# Patient Record
Sex: Female | Born: 1985 | Race: Black or African American | Hispanic: No | Smoking: Never smoker
Health system: Southern US, Community
[De-identification: ages and names within clinical notes are randomized; demographics above are authoritative.]

## PROBLEM LIST (undated history)

## (undated) DIAGNOSIS — N3090 Cystitis, unspecified without hematuria: Secondary | ICD-10-CM

## (undated) DIAGNOSIS — N390 Urinary tract infection, site not specified: Secondary | ICD-10-CM

## (undated) DIAGNOSIS — G43909 Migraine, unspecified, not intractable, without status migrainosus: Secondary | ICD-10-CM

## (undated) DIAGNOSIS — N809 Endometriosis, unspecified: Secondary | ICD-10-CM

## (undated) DIAGNOSIS — F419 Anxiety disorder, unspecified: Secondary | ICD-10-CM

## (undated) DIAGNOSIS — D649 Anemia, unspecified: Secondary | ICD-10-CM

## (undated) DIAGNOSIS — K219 Gastro-esophageal reflux disease without esophagitis: Secondary | ICD-10-CM

## (undated) HISTORY — PX: ESOPHAGOGASTRODUODENOSCOPY ENDOSCOPY: SHX5814

## (undated) HISTORY — PX: COLONOSCOPY: SHX174

## (undated) HISTORY — PX: UMBILICAL HERNIA REPAIR: SHX196

## (undated) HISTORY — DX: Gastro-esophageal reflux disease without esophagitis: K21.9

## (undated) HISTORY — DX: Anemia, unspecified: D64.9

---
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
## 2003-11-07 ENCOUNTER — Other Ambulatory Visit: Admission: RE | Admit: 2003-11-07 | Discharge: 2003-11-07 | Payer: Self-pay | Admitting: Obstetrics and Gynecology

## 2003-11-27 ENCOUNTER — Emergency Department (HOSPITAL_COMMUNITY): Admission: EM | Admit: 2003-11-27 | Discharge: 2003-11-27 | Payer: Self-pay | Admitting: Emergency Medicine

## 2003-12-15 ENCOUNTER — Emergency Department (HOSPITAL_COMMUNITY): Admission: EM | Admit: 2003-12-15 | Discharge: 2003-12-15 | Payer: Self-pay | Admitting: Emergency Medicine

## 2004-02-06 ENCOUNTER — Ambulatory Visit (HOSPITAL_COMMUNITY): Admission: RE | Admit: 2004-02-06 | Discharge: 2004-02-06 | Payer: Self-pay | Admitting: General Surgery

## 2004-02-06 ENCOUNTER — Encounter (INDEPENDENT_AMBULATORY_CARE_PROVIDER_SITE_OTHER): Payer: Self-pay | Admitting: *Deleted

## 2004-02-06 ENCOUNTER — Ambulatory Visit (HOSPITAL_BASED_OUTPATIENT_CLINIC_OR_DEPARTMENT_OTHER): Admission: RE | Admit: 2004-02-06 | Discharge: 2004-02-06 | Payer: Self-pay | Admitting: General Surgery

## 2004-03-11 ENCOUNTER — Emergency Department (HOSPITAL_COMMUNITY): Admission: EM | Admit: 2004-03-11 | Discharge: 2004-03-11 | Payer: Self-pay | Admitting: Emergency Medicine

## 2004-06-10 ENCOUNTER — Emergency Department (HOSPITAL_COMMUNITY): Admission: EM | Admit: 2004-06-10 | Discharge: 2004-06-10 | Payer: Self-pay | Admitting: Emergency Medicine

## 2004-09-20 ENCOUNTER — Emergency Department (HOSPITAL_COMMUNITY): Admission: EM | Admit: 2004-09-20 | Discharge: 2004-09-20 | Payer: Self-pay | Admitting: Emergency Medicine

## 2004-09-28 ENCOUNTER — Emergency Department (HOSPITAL_COMMUNITY): Admission: EM | Admit: 2004-09-28 | Discharge: 2004-09-28 | Payer: Self-pay | Admitting: Emergency Medicine

## 2004-10-13 ENCOUNTER — Emergency Department (HOSPITAL_COMMUNITY): Admission: EM | Admit: 2004-10-13 | Discharge: 2004-10-13 | Payer: Self-pay | Admitting: Emergency Medicine

## 2004-12-17 ENCOUNTER — Other Ambulatory Visit: Admission: RE | Admit: 2004-12-17 | Discharge: 2004-12-17 | Payer: Self-pay | Admitting: Obstetrics and Gynecology

## 2005-04-28 ENCOUNTER — Emergency Department (HOSPITAL_COMMUNITY): Admission: EM | Admit: 2005-04-28 | Discharge: 2005-04-28 | Payer: Self-pay | Admitting: Emergency Medicine

## 2005-06-30 ENCOUNTER — Emergency Department (HOSPITAL_COMMUNITY): Admission: EM | Admit: 2005-06-30 | Discharge: 2005-06-30 | Payer: Self-pay | Admitting: Emergency Medicine

## 2005-08-18 ENCOUNTER — Emergency Department (HOSPITAL_COMMUNITY): Admission: EM | Admit: 2005-08-18 | Discharge: 2005-08-18 | Payer: Self-pay | Admitting: Emergency Medicine

## 2005-10-12 ENCOUNTER — Emergency Department (HOSPITAL_COMMUNITY): Admission: EM | Admit: 2005-10-12 | Discharge: 2005-10-12 | Payer: Self-pay | Admitting: Emergency Medicine

## 2005-10-27 ENCOUNTER — Emergency Department (HOSPITAL_COMMUNITY): Admission: EM | Admit: 2005-10-27 | Discharge: 2005-10-27 | Payer: Self-pay | Admitting: Emergency Medicine

## 2005-12-08 ENCOUNTER — Emergency Department (HOSPITAL_COMMUNITY): Admission: EM | Admit: 2005-12-08 | Discharge: 2005-12-08 | Payer: Self-pay | Admitting: Emergency Medicine

## 2006-03-01 ENCOUNTER — Emergency Department (HOSPITAL_COMMUNITY): Admission: EM | Admit: 2006-03-01 | Discharge: 2006-03-01 | Payer: Self-pay | Admitting: Emergency Medicine

## 2006-03-03 ENCOUNTER — Emergency Department (HOSPITAL_COMMUNITY): Admission: EM | Admit: 2006-03-03 | Discharge: 2006-03-03 | Payer: Self-pay

## 2006-04-30 ENCOUNTER — Emergency Department (HOSPITAL_COMMUNITY): Admission: EM | Admit: 2006-04-30 | Discharge: 2006-04-30 | Payer: Self-pay | Admitting: Emergency Medicine

## 2006-05-25 ENCOUNTER — Emergency Department (HOSPITAL_COMMUNITY): Admission: EM | Admit: 2006-05-25 | Discharge: 2006-05-25 | Payer: Self-pay | Admitting: Emergency Medicine

## 2006-07-17 ENCOUNTER — Emergency Department (HOSPITAL_COMMUNITY): Admission: EM | Admit: 2006-07-17 | Discharge: 2006-07-18 | Payer: Self-pay | Admitting: Emergency Medicine

## 2006-10-28 ENCOUNTER — Emergency Department (HOSPITAL_COMMUNITY): Admission: EM | Admit: 2006-10-28 | Discharge: 2006-10-28 | Payer: Self-pay | Admitting: Emergency Medicine

## 2007-01-16 ENCOUNTER — Emergency Department (HOSPITAL_COMMUNITY): Admission: EM | Admit: 2007-01-16 | Discharge: 2007-01-16 | Payer: Self-pay | Admitting: Emergency Medicine

## 2007-01-17 ENCOUNTER — Emergency Department (HOSPITAL_COMMUNITY): Admission: EM | Admit: 2007-01-17 | Discharge: 2007-01-17 | Payer: Self-pay | Admitting: Emergency Medicine

## 2007-02-27 ENCOUNTER — Emergency Department (HOSPITAL_COMMUNITY): Admission: EM | Admit: 2007-02-27 | Discharge: 2007-02-27 | Payer: Self-pay | Admitting: Emergency Medicine

## 2007-03-13 ENCOUNTER — Emergency Department (HOSPITAL_COMMUNITY): Admission: EM | Admit: 2007-03-13 | Discharge: 2007-03-13 | Payer: Self-pay | Admitting: Emergency Medicine

## 2007-04-10 ENCOUNTER — Emergency Department (HOSPITAL_COMMUNITY): Admission: EM | Admit: 2007-04-10 | Discharge: 2007-04-10 | Payer: Self-pay | Admitting: Emergency Medicine

## 2007-06-07 ENCOUNTER — Emergency Department (HOSPITAL_COMMUNITY): Admission: EM | Admit: 2007-06-07 | Discharge: 2007-06-07 | Payer: Self-pay | Admitting: Emergency Medicine

## 2007-06-12 ENCOUNTER — Emergency Department (HOSPITAL_COMMUNITY): Admission: EM | Admit: 2007-06-12 | Discharge: 2007-06-12 | Payer: Self-pay | Admitting: Emergency Medicine

## 2007-06-22 ENCOUNTER — Emergency Department (HOSPITAL_COMMUNITY): Admission: EM | Admit: 2007-06-22 | Discharge: 2007-06-22 | Payer: Self-pay | Admitting: Emergency Medicine

## 2007-07-30 ENCOUNTER — Inpatient Hospital Stay (HOSPITAL_COMMUNITY): Admission: AD | Admit: 2007-07-30 | Discharge: 2007-07-30 | Payer: Self-pay | Admitting: Obstetrics & Gynecology

## 2007-09-19 ENCOUNTER — Emergency Department (HOSPITAL_COMMUNITY): Admission: EM | Admit: 2007-09-19 | Discharge: 2007-09-19 | Payer: Self-pay | Admitting: Emergency Medicine

## 2007-10-17 ENCOUNTER — Emergency Department (HOSPITAL_COMMUNITY): Admission: EM | Admit: 2007-10-17 | Discharge: 2007-10-17 | Payer: Self-pay | Admitting: Emergency Medicine

## 2007-11-11 ENCOUNTER — Emergency Department (HOSPITAL_COMMUNITY): Admission: EM | Admit: 2007-11-11 | Discharge: 2007-11-11 | Payer: Self-pay | Admitting: Emergency Medicine

## 2007-12-08 ENCOUNTER — Emergency Department (HOSPITAL_COMMUNITY): Admission: EM | Admit: 2007-12-08 | Discharge: 2007-12-08 | Payer: Self-pay | Admitting: Emergency Medicine

## 2008-01-18 ENCOUNTER — Emergency Department (HOSPITAL_COMMUNITY): Admission: EM | Admit: 2008-01-18 | Discharge: 2008-01-18 | Payer: Self-pay | Admitting: Emergency Medicine

## 2008-01-23 ENCOUNTER — Emergency Department (HOSPITAL_COMMUNITY): Admission: EM | Admit: 2008-01-23 | Discharge: 2008-01-24 | Payer: Self-pay | Admitting: Emergency Medicine

## 2008-01-31 ENCOUNTER — Emergency Department (HOSPITAL_COMMUNITY): Admission: EM | Admit: 2008-01-31 | Discharge: 2008-02-01 | Payer: Self-pay | Admitting: Emergency Medicine

## 2008-03-25 ENCOUNTER — Emergency Department (HOSPITAL_COMMUNITY): Admission: EM | Admit: 2008-03-25 | Discharge: 2008-03-25 | Payer: Self-pay | Admitting: Emergency Medicine

## 2008-07-15 ENCOUNTER — Emergency Department (HOSPITAL_COMMUNITY): Admission: EM | Admit: 2008-07-15 | Discharge: 2008-07-15 | Payer: Self-pay | Admitting: Emergency Medicine

## 2008-08-10 ENCOUNTER — Emergency Department (HOSPITAL_COMMUNITY): Admission: EM | Admit: 2008-08-10 | Discharge: 2008-08-10 | Payer: Self-pay | Admitting: Emergency Medicine

## 2008-09-05 ENCOUNTER — Inpatient Hospital Stay (HOSPITAL_COMMUNITY): Admission: AD | Admit: 2008-09-05 | Discharge: 2008-09-05 | Payer: Self-pay | Admitting: Obstetrics & Gynecology

## 2008-10-17 ENCOUNTER — Ambulatory Visit: Payer: Self-pay | Admitting: Obstetrics and Gynecology

## 2008-10-21 ENCOUNTER — Ambulatory Visit (HOSPITAL_COMMUNITY): Admission: RE | Admit: 2008-10-21 | Discharge: 2008-10-21 | Payer: Self-pay | Admitting: Obstetrics & Gynecology

## 2008-10-31 ENCOUNTER — Encounter (INDEPENDENT_AMBULATORY_CARE_PROVIDER_SITE_OTHER): Payer: Self-pay | Admitting: Family Medicine

## 2008-10-31 ENCOUNTER — Ambulatory Visit: Payer: Self-pay | Admitting: Family Medicine

## 2008-11-28 ENCOUNTER — Ambulatory Visit: Payer: Self-pay | Admitting: Obstetrics and Gynecology

## 2009-02-13 ENCOUNTER — Ambulatory Visit: Payer: Self-pay | Admitting: Obstetrics and Gynecology

## 2009-02-26 ENCOUNTER — Emergency Department (HOSPITAL_COMMUNITY): Admission: EM | Admit: 2009-02-26 | Discharge: 2009-02-26 | Payer: Self-pay | Admitting: Emergency Medicine

## 2009-02-27 ENCOUNTER — Emergency Department (HOSPITAL_COMMUNITY): Admission: EM | Admit: 2009-02-27 | Discharge: 2009-02-27 | Payer: Self-pay | Admitting: Emergency Medicine

## 2009-03-12 ENCOUNTER — Ambulatory Visit: Payer: Self-pay | Admitting: Obstetrics and Gynecology

## 2009-03-13 ENCOUNTER — Encounter: Payer: Self-pay | Admitting: Obstetrics and Gynecology

## 2009-03-13 LAB — CONVERTED CEMR LAB
Clue Cells Wet Prep HPF POC: NONE SEEN
Trich, Wet Prep: NONE SEEN
WBC, Wet Prep HPF POC: NONE SEEN
Yeast Wet Prep HPF POC: NONE SEEN

## 2009-05-01 ENCOUNTER — Ambulatory Visit: Payer: Self-pay | Admitting: Obstetrics & Gynecology

## 2009-07-17 ENCOUNTER — Ambulatory Visit: Payer: Self-pay | Admitting: Obstetrics and Gynecology

## 2009-07-28 ENCOUNTER — Emergency Department (HOSPITAL_COMMUNITY): Admission: EM | Admit: 2009-07-28 | Discharge: 2009-07-28 | Payer: Self-pay | Admitting: Emergency Medicine

## 2009-08-13 ENCOUNTER — Ambulatory Visit: Payer: Self-pay | Admitting: Obstetrics and Gynecology

## 2010-02-02 ENCOUNTER — Ambulatory Visit: Payer: Self-pay | Admitting: Obstetrics and Gynecology

## 2010-04-09 ENCOUNTER — Emergency Department (HOSPITAL_COMMUNITY): Admission: EM | Admit: 2010-04-09 | Discharge: 2010-04-09 | Payer: Self-pay | Admitting: Emergency Medicine

## 2010-04-12 ENCOUNTER — Emergency Department (HOSPITAL_COMMUNITY): Admission: EM | Admit: 2010-04-12 | Discharge: 2010-04-12 | Payer: Self-pay | Admitting: Emergency Medicine

## 2010-04-27 IMAGING — CT CT PELVIS W/O CM
1 series · 16 of 32 positions shown, 20 images · non-contrast
Comparison: CT the abdomen pelvis earlier [DATE].

CLINICAL DATA: Abdominal pain rule out appendicitis.

CT PELVIS WITHOUT CONTRAST
TECHNIQUE: Multidetector CT imaging of the pelvis was performed
following the standard protocol without intravenous contrast.

[Series 2: stone_wo 5.0 b40f st · axial · 0.64mm/px · z∈[+50,+310]mm · 16 of 73 slices shown, 20 images]
[im 5/73  soft-tissue]
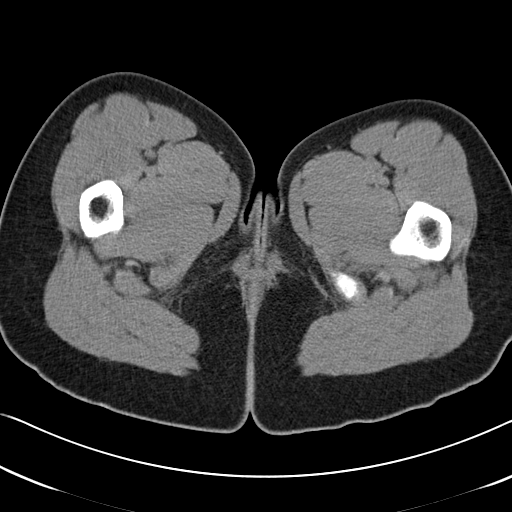
[im 5/73  bone]
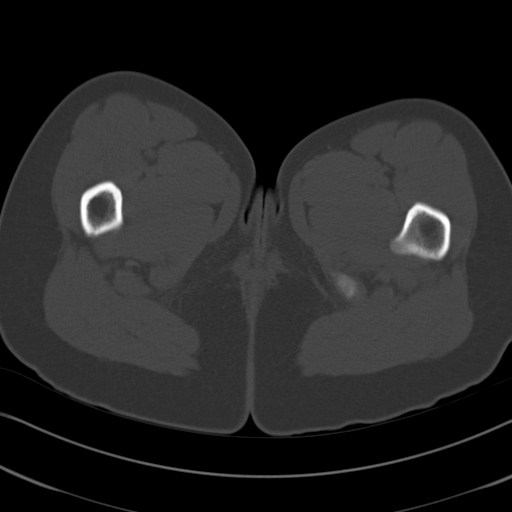
[im 10/73  soft-tissue]
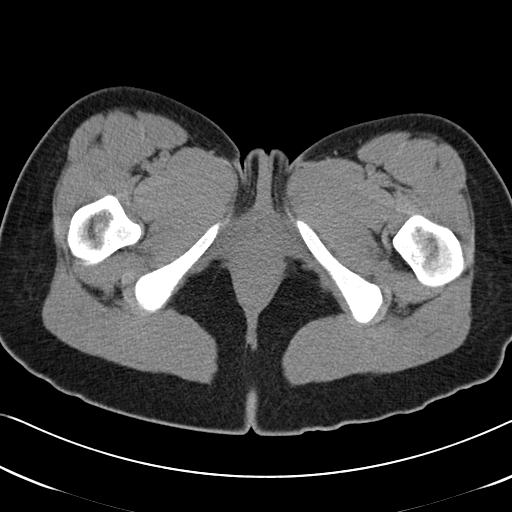
[im 14/73  soft-tissue]
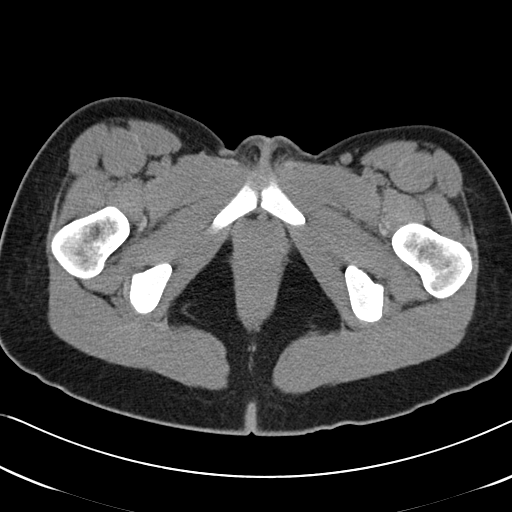
[im 19/73  soft-tissue]
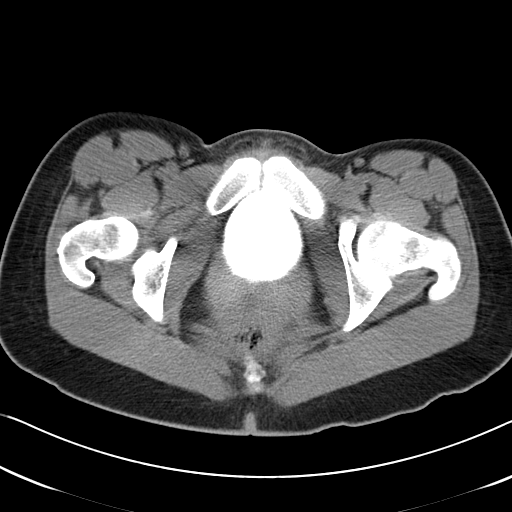
[im 24/73  soft-tissue]
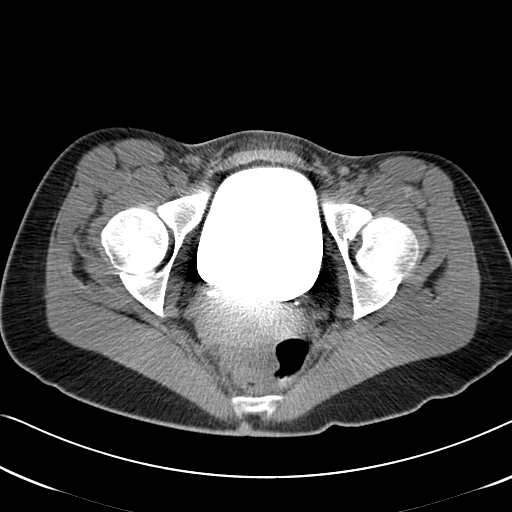
[im 28/73  soft-tissue]
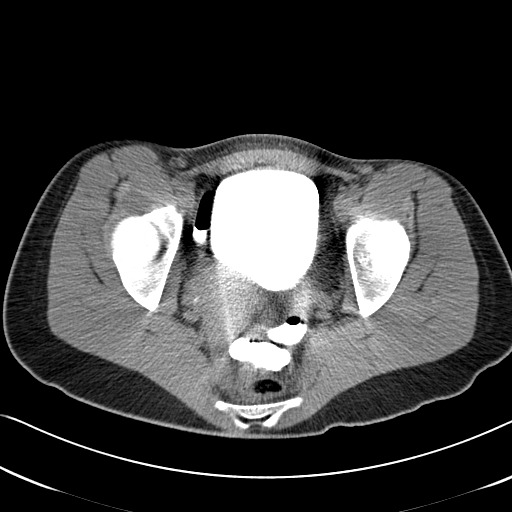
[im 33/73  soft-tissue]
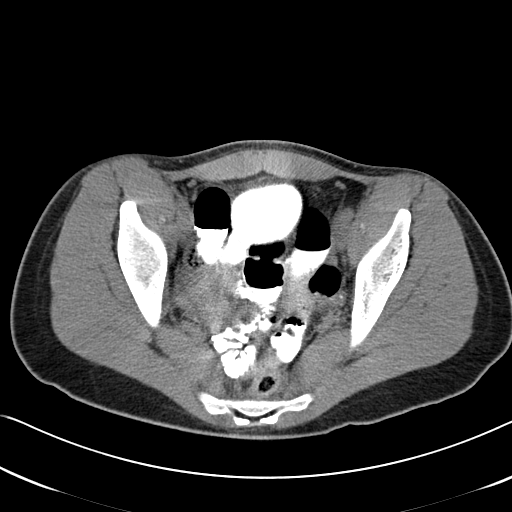
[im 40/73  soft-tissue]
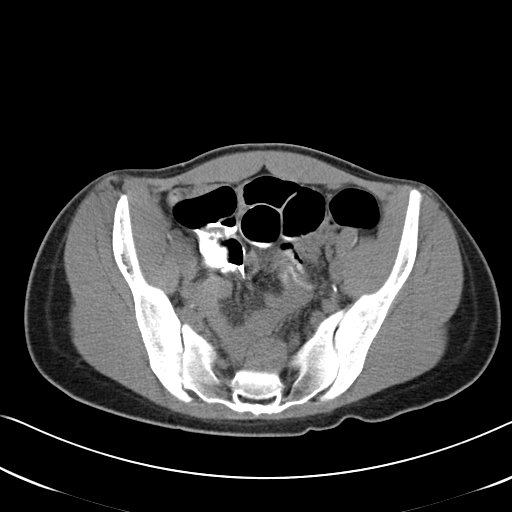
[im 45/73  soft-tissue]
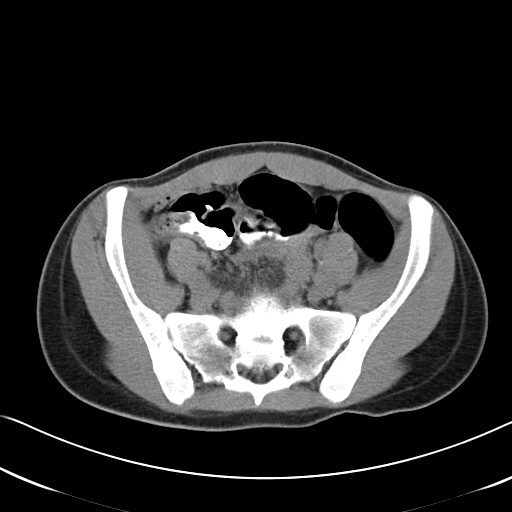
[im 45/73  bone]
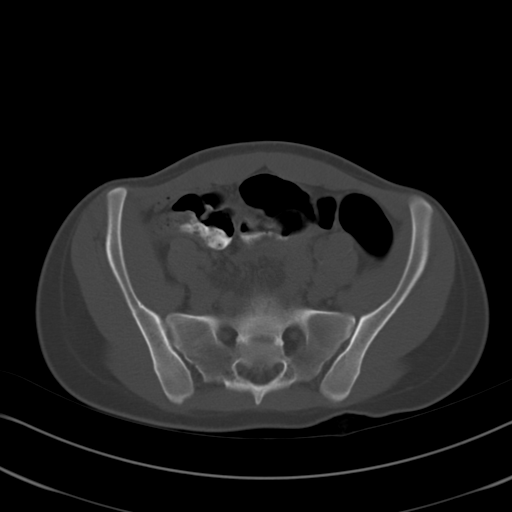
[im 49/73  soft-tissue]
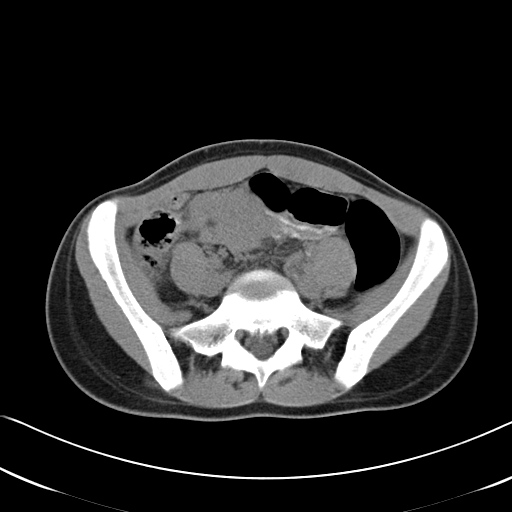
[im 54/73  soft-tissue]
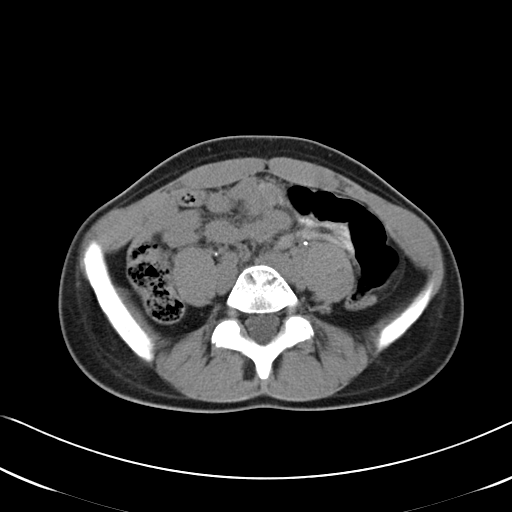
[im 59/73  soft-tissue]
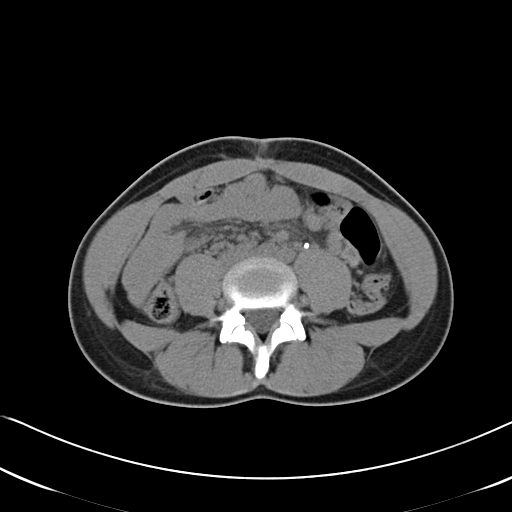
[im 63/73  soft-tissue]
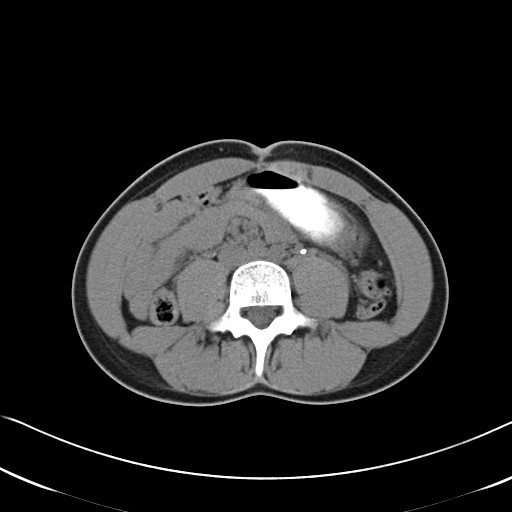
[im 63/73  lung]
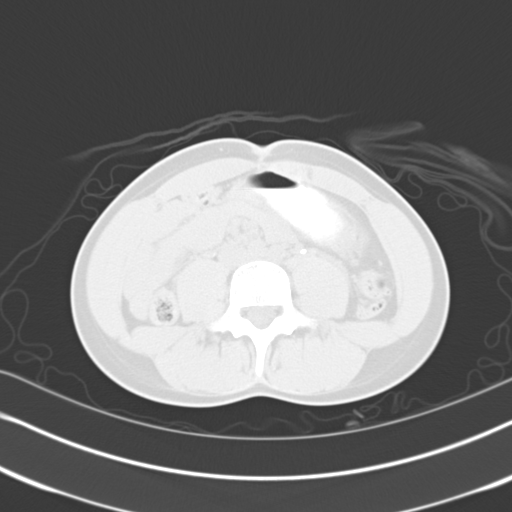
[im 66/73  lung]
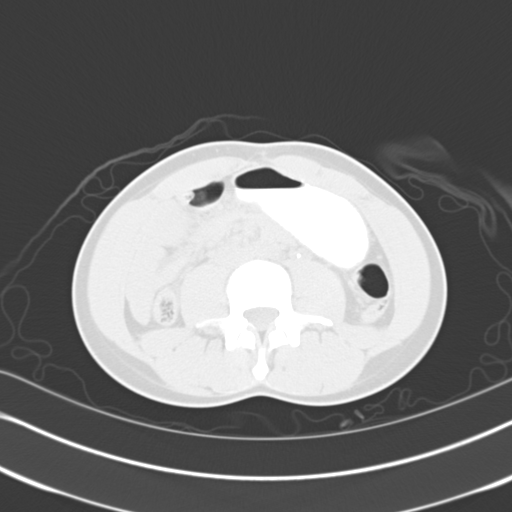
[im 68/73  soft-tissue]
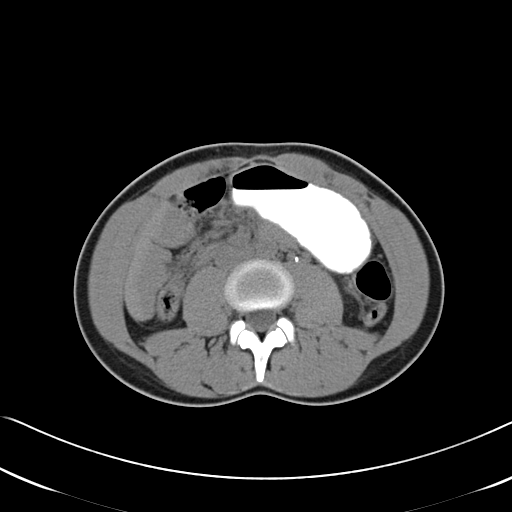
[im 68/73  lung]
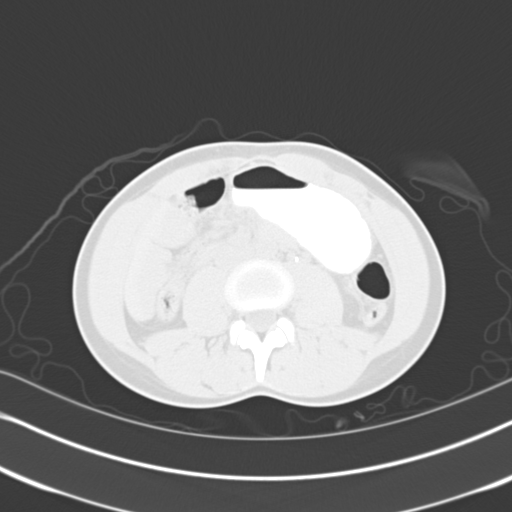
[im 70/73  lung]
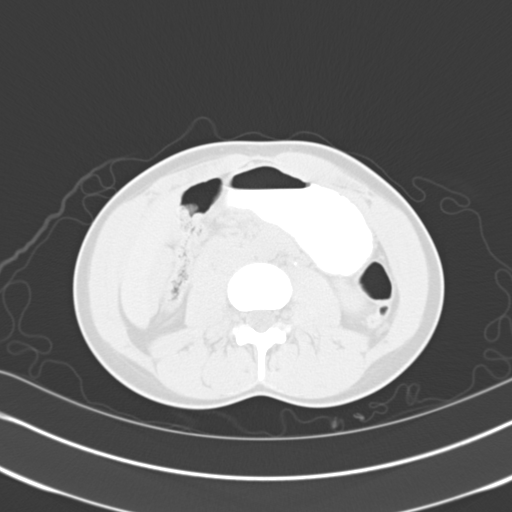

[16 of 32 positions shown; findings below may reference images not displayed]

FINDINGS: Delayed images through the pelvis show opacification of
the distal small bowel loops and the proximal aspect of the
ascending colon.  There is significant artifact from contrast-
opacified bladder.  The bowel loop in question within the right
lower quadrant is no longer suspicious.  The right lower quadrant
bowel loops do fill normally with contrast.  The appendix is not
definitely identified
IMPRESSION: No definite evidence for acute appendicitis.  However the appendix
is not well seen.  I discussed findings with Dr. Adili Ritaj.

## 2010-07-22 ENCOUNTER — Emergency Department (HOSPITAL_COMMUNITY)
Admission: EM | Admit: 2010-07-22 | Discharge: 2010-07-22 | Payer: Self-pay | Source: Home / Self Care | Admitting: Emergency Medicine

## 2010-08-02 ENCOUNTER — Encounter: Payer: Self-pay | Admitting: *Deleted

## 2010-09-01 ENCOUNTER — Emergency Department (HOSPITAL_COMMUNITY)
Admission: EM | Admit: 2010-09-01 | Discharge: 2010-09-01 | Disposition: A | Discharge status: Home / Self Care | Payer: Self-pay | Attending: Emergency Medicine | Admitting: Emergency Medicine

## 2010-09-01 DIAGNOSIS — B86 Scabies: Secondary | ICD-10-CM | POA: Insufficient documentation

## 2010-09-01 DIAGNOSIS — J3489 Other specified disorders of nose and nasal sinuses: Secondary | ICD-10-CM | POA: Insufficient documentation

## 2010-09-01 DIAGNOSIS — H9209 Otalgia, unspecified ear: Secondary | ICD-10-CM | POA: Insufficient documentation

## 2010-09-01 DIAGNOSIS — J069 Acute upper respiratory infection, unspecified: Secondary | ICD-10-CM | POA: Insufficient documentation

## 2010-09-01 DIAGNOSIS — L299 Pruritus, unspecified: Secondary | ICD-10-CM | POA: Insufficient documentation

## 2010-09-27 LAB — POCT PREGNANCY, URINE

## 2010-09-27 LAB — URINALYSIS, ROUTINE W REFLEX MICROSCOPIC
Ketones, ur: NEGATIVE mg/dL
Nitrite: NEGATIVE
Protein, ur: 100 mg/dL — AB
Urobilinogen, UA: 1 mg/dL (ref 0.0–1.0)

## 2010-10-17 LAB — COMPREHENSIVE METABOLIC PANEL
AST: 24 U/L (ref 0–37)
BUN: 6 mg/dL (ref 6–23)
CO2: 27 mEq/L (ref 19–32)
Chloride: 109 mEq/L (ref 96–112)
Creatinine, Ser: 0.87 mg/dL (ref 0.4–1.2)
GFR calc non Af Amer: >60 mL/min (ref >60)
Glucose, Bld: 82 mg/dL (ref 70–99)
Total Bilirubin: 1 mg/dL (ref 0.3–1.2)

## 2010-10-17 LAB — POCT CARDIAC MARKERS: CKMB, poc: <1 ng/mL — ABNORMAL LOW (ref 1.0–8.0)

## 2010-10-17 LAB — POCT I-STAT, CHEM 8
Creatinine, Ser: 0.9 mg/dL (ref 0.4–1.2)
Hemoglobin: 14.6 g/dL (ref 12.0–15.0)
Sodium: 138 mEq/L (ref 135–145)
TCO2: 22 mmol/L (ref 0–100)

## 2010-10-26 LAB — URINALYSIS, ROUTINE W REFLEX MICROSCOPIC
Bilirubin Urine: NEGATIVE
Glucose, UA: NEGATIVE mg/dL
Ketones, ur: NEGATIVE mg/dL
Ketones, ur: NEGATIVE mg/dL
Leukocytes, UA: NEGATIVE
Nitrite: NEGATIVE
Protein, ur: 30 mg/dL — AB
Protein, ur: NEGATIVE mg/dL
pH: 7 (ref 5.0–8.0)

## 2010-10-26 LAB — CBC
HCT: 35.2 % — ABNORMAL LOW (ref 36.0–46.0)
Hemoglobin: 11.8 g/dL — ABNORMAL LOW (ref 12.0–15.0)
MCHC: 33.4 g/dL (ref 30.0–36.0)
MCV: 90.4 fL (ref 78.0–100.0)
Platelets: 164 K/uL (ref 150–400)
RBC: 3.9 MIL/uL (ref 3.87–5.11)
RDW: 13.8 % (ref 11.5–15.5)
WBC: 7.4 10*3/uL (ref 4.0–10.5)

## 2010-10-26 LAB — DIFFERENTIAL
Basophils Absolute: 0 K/uL (ref 0.0–0.1)
Basophils Relative: 0 % (ref 0–1)
Eosinophils Absolute: 0.1 10*3/uL (ref 0.0–0.7)
Eosinophils Relative: 1 % (ref 0–5)
Lymphocytes Relative: 26 % (ref 12–46)
Lymphs Abs: 1.9 K/uL (ref 0.7–4.0)
Monocytes Absolute: 0.4 K/uL (ref 0.1–1.0)
Monocytes Relative: 5 % (ref 3–12)
Neutro Abs: 5 K/uL (ref 1.7–7.7)
Neutrophils Relative %: 68 % (ref 43–77)

## 2010-10-26 LAB — COMPREHENSIVE METABOLIC PANEL WITH GFR
AST: 18 U/L (ref 0–37)
Albumin: 3.3 g/dL — ABNORMAL LOW (ref 3.5–5.2)
BUN: 8 mg/dL (ref 6–23)
Calcium: 9.1 mg/dL (ref 8.4–10.5)
Creatinine, Ser: 0.68 mg/dL (ref 0.4–1.2)
GFR calc Af Amer: >60 mL/min (ref >60)
GFR calc non Af Amer: >60 mL/min (ref >60)

## 2010-10-26 LAB — COMPREHENSIVE METABOLIC PANEL
ALT: 11 U/L (ref 0–35)
Alkaline Phosphatase: 57 U/L (ref 39–117)
CO2: 24 mEq/L (ref 19–32)
Chloride: 109 mEq/L (ref 96–112)
Glucose, Bld: 118 mg/dL — ABNORMAL HIGH (ref 70–99)
Potassium: 3.5 mEq/L (ref 3.5–5.1)
Sodium: 140 mEq/L (ref 135–145)
Total Bilirubin: 0.5 mg/dL (ref 0.3–1.2)
Total Protein: 6 g/dL (ref 6.0–8.3)

## 2010-10-26 LAB — LIPASE, BLOOD: Lipase: 41 U/L (ref 11–59)

## 2010-10-26 LAB — POCT I-STAT, CHEM 8
BUN: 11 mg/dL (ref 6–23)
Calcium, Ion: 0.88 mmol/L — ABNORMAL LOW (ref 1.12–1.32)
Creatinine, Ser: 1 mg/dL (ref 0.4–1.2)
TCO2: 17 mmol/L (ref 0–100)

## 2010-10-26 LAB — WET PREP, GENITAL
Clue Cells Wet Prep HPF POC: NONE SEEN
Trich, Wet Prep: NONE SEEN
Trich, Wet Prep: NONE SEEN
WBC, Wet Prep HPF POC: NONE SEEN
Yeast Wet Prep HPF POC: NONE SEEN
Yeast Wet Prep HPF POC: NONE SEEN

## 2010-10-26 LAB — GC/CHLAMYDIA PROBE AMP, GENITAL
Chlamydia, DNA Probe: NEGATIVE
GC Probe Amp, Genital: NEGATIVE

## 2010-10-27 LAB — URINALYSIS, ROUTINE W REFLEX MICROSCOPIC
Glucose, UA: NEGATIVE mg/dL
Hgb urine dipstick: NEGATIVE
Protein, ur: NEGATIVE mg/dL
Specific Gravity, Urine: 1.02 (ref 1.005–1.030)

## 2010-10-27 LAB — RAPID URINE DRUG SCREEN, HOSP PERFORMED
Amphetamines: NOT DETECTED
Barbiturates: NOT DETECTED
Benzodiazepines: NOT DETECTED
Opiates: NOT DETECTED

## 2010-10-27 LAB — CBC
MCHC: 32.8 g/dL (ref 30.0–36.0)
RDW: 13.8 % (ref 11.5–15.5)

## 2010-11-24 NOTE — Group Therapy Note (Signed)
NAMERAEGHAN, DEMETER NO.:  [id number]   MEDICAL RECORD NO.:  [id number]          PATIENT TYPE:  WOC   LOCATION:  [facility]                   FACILITY:  [facility]   PHYSICIAN:  Dorthula Perfect, MD     DATE OF BIRTH:  1985/11/04   DATE OF SERVICE:                                  CLINIC NOTE   A 25 year old black female, gravida 0, returns for Depo-Provera.  She  also has a complaint of a 2-week history of a white somewhat itchy  vaginal discharge that does not having any appreciable odor.  Her sexual  partner is circumcised.   PLAN:  She is receiving the Depo-Provera because of long history of  chronic pelvic pain.  She had had any endometriosis diagnosed on a  biopsy, which was performed during an umbilical hernia repair.  Her last  Depo-Provera was approximately 3 months ago.  She is having no vaginal  bleeding.  She has no pelvic pain.  Her last period was on October 04, 2008.  Her last Pap smear was on November 04, 2008.  Her last ultrasound  was in April of this year and was normal.   PHYSICAL EXAMINATION:  VITAL SIGNS:  Height 5 feet 6, weight 114, and  blood pressure 101/68.  ABDOMEN:  Flat, soft, and nontender.  PELVIC:  External genitalia, BUS, Skene glands were normal.  There were  no lesions on the vulva.  There was a watery, bubbly white vaginal  discharge present.  The cervix was normal.  Uterus is of normal size and  shape.  The adnexa structures were normal.   Wet prep was done, which revealed findings compatible with bacterial  vaginosis.   DIAGNOSES:  1. Bacterial vaginosis.  2. Pelvic endometriosis.   DISPOSITION:  1. Wet prep.  2. Metronidazole 500 mg tablets 14 to take 1 twice a day.  3. Depo-Provera 150 mg.           ______________________________  Dorthula Perfect, MD     ER/MEDQ  D:  02/13/2009  T:  02/14/2009  Job:  [id number]

## 2010-11-24 NOTE — Group Therapy Note (Signed)
[name], [name] NO.:  [id number]   MEDICAL RECORD NO.:  [id number]          PATIENT TYPE:  WOC   LOCATION:  [facility]                   FACILITY:  [facility]   PHYSICIAN:  Argentina Donovan, MD        DATE OF BIRTH:  1985/09/26   DATE OF SERVICE:                                  CLINIC NOTE   SUBJECTIVE:  The patient is a 25 year old female with a history of  endometriosis diagnosed 5 years ago following umbilical hernia repair  when they found endometrial tissue commented on the  pathology, who  presents following visit to [facility] __________ admission seen  on September 05, 2008.  At that time the patient was complaining of  abdominal pain.  The patient reports that she has required multiple  hospitalizations for abdominal pain in the past.  The patient was given  injection of Toradol and Depo-Provera and subsequently has had  improvement in her pain.  The patient has continued to have some  abnormal bleeding, however her pain is now controlled with over-the-  counter ibuprofen.  The patient is currently on her menstrual cycle and  has been for approximately 2 weeks.  Of note, the patient was evaluated  in January 2010 and found to have an ovarian cyst on her right ovary  that was complex in nature and had not had followup for it.   OBJECTIVE:  Temperature 98.5, heart rate 72, blood pressure 108/72,  weight 112.9 pounds, height 5 feet 6.  The patient is a thin female in no apparent distress.  GU EXAM:  Deferred secondary to menstrual bleeding.  ABDOMEN:  The patient's abdomen is thin, soft, nontender, nondistended  __________ ,  mild tenderness to palpation suprapubically.   ASSESSMENT AND PLAN:  1. Endometriosis:  Pain really improved with Depo-Provera.  However,      the patient now with breakthrough bleeding.  Will do trial of      estradiol 1 mg p.o. daily x10 days to help with menstrual bleeding      and have patient follow up in 2 weeks.  The patient  will be due for      next Depo-Provera injection around Dec 03, 2008.  2. Complex right ovarian cyst:  The patient has been told in the past      that she had ovarian cyst.  On ultrasound was noted to have a 4.2-      cm complex cyst on the right ovary.  Will get follow-up ultrasound      to evaluate.     ______________________________  Lequita Asal, M.D.    ______________________________  Argentina Donovan, MD    TB/MEDQ  D:  10/17/2008  T:  10/17/2008  Job:  [id number]

## 2010-11-24 NOTE — Group Therapy Note (Signed)
NAMEJAMIAYA, Jane [name] NO.:  [id number]   MEDICAL RECORD NO.:  [id number]          PATIENT TYPE:  WOC   LOCATION:  [facility]                   FACILITY:  [facility]   PHYSICIAN:  Scheryl Darter, MD       DATE OF BIRTH:  03-May-1986   DATE OF SERVICE:  10/31/2008                                  CLINIC NOTE   REASON FOR VISIT:  Follow-up on ultrasound and Pap smear.   HISTORY OF PRESENT ILLNESS:  [name] is a 25 year old nulliparous  female with a long history of chronic pelvic pain, recently diagnosed  with endometriosis from a biopsy which was performed during an umbilical  hernia repair.  She received Depo-Provera approximately 2 months ago  which has provided her with exceptional relief of her chronic pain.  She  relates some bleeding initially from the Depo shot. However, after a 10-  day course of estradiol, the bleeding has completely resolved.  She  relates previously having almost daily moderate to severe pain to her  present case where she essentially has no pain except for occasional  brief periods.  Also of note during this visit, she noted that she has  not had a Pap smear in several years.   PAST MEDICAL HISTORY:  Significant for chronic pelvic pain and  endometriosis.   PAST SURGICAL HISTORY:  Related to an umbilical hernia repair.  She has  never been pregnant. She relates a previous abnormal Pap smear which was  treated with only a repeat Pap.  She has no history of colposcopy.  She  denies any history of sexually transmitted diseases.  She is using Depo-  Provera for birth control.   FAMILY HISTORY:  Significant for grandmother who developed breast cancer  at the age of 31.   SOCIAL HISTORY:  She denies tobacco use or alcohol use.   PHYSICAL EXAM:  VITAL SIGNS:  On exam her vital signs stable and normal.  GENERAL: She is healthy-appearing in no acute distress.  BREASTS:  Exam was performed and revealed no discrete masses.  Fibrocystic breast  tissue is normal bilaterally.  There is no nipple  discharge.  There is no axillary adenopathy bilaterally.  ABDOMEN:  Soft and nontender.  There is no organomegaly noted.  GENITOURINARY:  Her external female genitalia is normal-appearing.  Vaginal mucosa is normal with normal rugae.  The cervix is nulliparous-  appearing without lesion.  There is no vaginal discharge present.  Her  uterus is of normal size and shape. The adnexa reveal no fullness or  tenderness.   ASSESSMENT/PLAN:  1. Female well-woman pelvic exam.  Normal exam today.  Pap smear was      performed, and the patient will get the results by mail in      approximately 2-3 weeks.  2. Hemorrhagic ovarian cyst.  The patient had previously had an      ovarian cyst noted on ultrasound. Ultrasound was repeated after a 6-      week interval approximately 2 weeks ago which revealed complete      resolution of the cyst.  3. Endometriosis.  The patient has had  a very remarkable improvement      with her Depo-Provera, and she will continue to receive a shot at      the appropriate intervals.     ______________________________  Odie Sera, D.O.    ______________________________  Scheryl Darter, MD    MC/MEDQ  D:  10/31/2008  T:  10/31/2008  Job:  [id number]

## 2010-11-24 NOTE — Group Therapy Note (Signed)
NAME:  Jane [name], Jane [name] NO.:  [id number]   MEDICAL RECORD NO.:  [id number]          PATIENT TYPE:  WOC   LOCATION:  [facility]                   FACILITY:  [facility]   PHYSICIAN:  Argentina Donovan, MD        DATE OF BIRTH:  1986/03/19   DATE OF SERVICE:                                  CLINIC NOTE   The patient is a 25 year old nulligravida African American female who is  on Depo-Provera with much success because of the history of severe  chronic pelvic pain, which was diagnosed as probable endometriosis based  on a biopsy from a nodule removed from her umbilicus, which confirmed  endometriosis.  At the present time she has mild cramping with her  periods, but nothing out.  She was in a short-time ago and was treated  with metronidazole for vaginal irritation and she still says she has  some itching.  She came back in today.  We did wet prep on her and also  a viral culture on some paper cut looking lesions that were just on the  perineum just to the right of the left labia minora.  I told her that if  it showed that was positive for herpes, we would put her on suppression  and if it showed nothing or yeast, I would treat her with an  antimonilial cream.  The other possibility is that this is a  neurodermatitis from the itching and that these are scratch marks in any  case that is the area of her concern.   IMPRESSION:  Perineal vulvar itching secondary to vulvitis.  Pending lab  tests.  The patient will be called with results.           ______________________________  Argentina Donovan, MD     PR/MEDQ  D:  03/12/2009  T:  03/13/2009  Job:  [id number]

## 2010-11-27 NOTE — Op Note (Signed)
NAME:  Jane [name], Jane [name]                      ACCOUNT NO.:  [id number]   MEDICAL RECORD NO.:  [id number]                   PATIENT TYPE:  AMB   LOCATION:  [facility]                                  FACILITY:  [facility]   PHYSICIAN:  Angelia Mould. Derrell Lolling, M.D.             DATE OF BIRTH:  11-May-1986   DATE OF PROCEDURE:  02/06/2004  DATE OF DISCHARGE:                                 OPERATIVE REPORT   PREOPERATIVE DIAGNOSIS:  Incarcerated umbilical hernia.   POSTOPERATIVE DIAGNOSES:  1. Umbilical hernia.  2. Soft tissue mass of umbilicus (1.0 cm diameter).   OPERATION PERFORMED:  1. Excise soft tissue mass of the umbilicus (1.0 cm diameter).  2. Repair umbilical hernia.   SURGEON:  Angelia Mould. Derrell Lolling, M.D.   OPERATIVE INDICATION:  This is a 25 year old black female who presented with  periumbilical pain.  She was found to have a tender nodule presenting at the  superior aspect of the umbilicus.  This palpation reproduced her pain.  The  nodule was not reducible.  It was postulated that this was an incarcerated  umbilical hernia, and soft tissue mass could not be ruled out.  She is  brought to the operating room electively.   OPERATIVE FINDINGS:  The patient had a small umbilical hernia.  However,  within the fascia at the superior rim of the umbilical hernia, there was a  1.0 cm nodule which was mostly solid, looked a little bit hemorrhagic,  consistent with an old hematoma or possibly endometriosis.  This was  completely excised and sent for histologic exam.   OPERATIVE TECHNIQUE:  Following the induction of general endotracheal  anesthesia, the patient's abdomen was prepped and draped in a sterile  fashion.  Marcaine 0.5% with epinephrine was used as a local infiltration  anesthetic.  A curved transverse incision was made at the lower rim of the  umbilicus.  Dissection was carried down through the subcutaneous tissue to  the anterior abdominal wall fascia.  Dissection was carried around  the  umbilical skin and the umbilical skin dissected off of the fascia, revealing  about a 1.0 cm umbilical hernia.  There really were no incarcerated contents  in this.  Palpation superiorly revealed a mass within the fascia.  I  undermined the subcutaneous tissue away from the fascia circumferentially  and then using a knife completely excised the nodule in the fascia at the  superior rim of the umbilical hernia.  I cut this open on the side table,  and it was mostly solid and a little bit hemorrhagic.  This was sent for  histology.   I palpated within the abdomen the abdominal wall and felt no other defects.  The fascia was closed vertically with five interrupted sutures of #1  Novofil.  I was careful to invert the knots because the patient is thin.  After all of these sutures were placed, I then tied all of them and the  repair looked quite secure.  The wound was irrigated with saline.  The  umbilical skin was tacked down to the fascia with a 3-0 Vicryl suture.  Subcutaneous tissue was closed with  interrupted sutures of 3-0 Vicryl.  The skin was closed with a running  subcuticular suture of 4-0 Monocryl and Steri-Strips.  Clean bandages were  placed and the patient taken to the recovery room in stable condition.  Estimated blood loss was about 10 mL.  Complications:  None.  Sponge,  needle, and instrument counts were correct.                                               Angelia Mould. Derrell Lolling, M.D.    HMI/MEDQ  D:  02/06/2004  T:  02/06/2004  Job:  [id number]   cc:   Lorelle Formosa, M.D.  [phone] [address]  [place]  [place] [address]  Fax: [phone]

## 2010-12-02 ENCOUNTER — Emergency Department (HOSPITAL_COMMUNITY)
Admission: EM | Admit: 2010-12-02 | Discharge: 2010-12-02 | Disposition: A | Discharge status: Home / Self Care | Payer: Self-pay | Attending: Emergency Medicine | Admitting: Emergency Medicine

## 2010-12-02 DIAGNOSIS — L03119 Cellulitis of unspecified part of limb: Secondary | ICD-10-CM | POA: Insufficient documentation

## 2010-12-02 DIAGNOSIS — L02419 Cutaneous abscess of limb, unspecified: Secondary | ICD-10-CM | POA: Insufficient documentation

## 2010-12-16 ENCOUNTER — Ambulatory Visit: Payer: Self-pay | Admitting: Advanced Practice Midwife

## 2011-01-09 DIAGNOSIS — F1410 Cocaine abuse, uncomplicated: Secondary | ICD-10-CM | POA: Insufficient documentation

## 2011-01-09 DIAGNOSIS — N83209 Unspecified ovarian cyst, unspecified side: Secondary | ICD-10-CM | POA: Insufficient documentation

## 2011-01-09 DIAGNOSIS — N803 Endometriosis of pelvic peritoneum: Secondary | ICD-10-CM

## 2011-01-21 ENCOUNTER — Emergency Department (HOSPITAL_COMMUNITY)
Admission: EM | Admit: 2011-01-21 | Discharge: 2011-01-21 | Disposition: A | Discharge status: Home / Self Care | Payer: Self-pay | Attending: Emergency Medicine | Admitting: Emergency Medicine

## 2011-01-21 DIAGNOSIS — R51 Headache: Secondary | ICD-10-CM | POA: Insufficient documentation

## 2011-01-21 DIAGNOSIS — Z8742 Personal history of other diseases of the female genital tract: Secondary | ICD-10-CM | POA: Insufficient documentation

## 2011-01-21 DIAGNOSIS — R110 Nausea: Secondary | ICD-10-CM | POA: Insufficient documentation

## 2011-01-22 ENCOUNTER — Ambulatory Visit: Payer: Self-pay | Admitting: Advanced Practice Midwife

## 2011-04-02 LAB — URINALYSIS, ROUTINE W REFLEX MICROSCOPIC
Ketones, ur: 15 — AB
Nitrite: NEGATIVE
Protein, ur: NEGATIVE

## 2011-04-02 LAB — CBC
Hemoglobin: 11.9 — ABNORMAL LOW
MCHC: 33.9
RBC: 3.89

## 2011-04-02 LAB — WET PREP, GENITAL

## 2011-04-02 LAB — BASIC METABOLIC PANEL
CO2: 23
Calcium: 9
Chloride: 105
GFR calc Af Amer: >60
Sodium: 138

## 2011-04-02 LAB — PREGNANCY, URINE: Preg Test, Ur: NEGATIVE

## 2011-04-05 LAB — URINALYSIS, ROUTINE W REFLEX MICROSCOPIC
Glucose, UA: NEGATIVE
Protein, ur: NEGATIVE
pH: 7

## 2011-04-05 LAB — URINE MICROSCOPIC-ADD ON

## 2011-04-06 LAB — CBC
Platelets: 185
RDW: 13.1
WBC: 7.7

## 2011-04-06 LAB — URINALYSIS, ROUTINE W REFLEX MICROSCOPIC
Bilirubin Urine: NEGATIVE
Glucose, UA: NEGATIVE
Ketones, ur: NEGATIVE
Protein, ur: 100 — AB

## 2011-04-06 LAB — DIFFERENTIAL
Basophils Absolute: 0
Lymphocytes Relative: 21
Lymphs Abs: 1.6
Neutro Abs: 5.8

## 2011-04-06 LAB — BASIC METABOLIC PANEL
BUN: 9
Calcium: 8.8
GFR calc non Af Amer: >60
Glucose, Bld: 114 — ABNORMAL HIGH
Potassium: 4
Sodium: 136

## 2011-04-06 LAB — URINE MICROSCOPIC-ADD ON

## 2011-04-06 LAB — PREGNANCY, URINE: Preg Test, Ur: NEGATIVE

## 2011-04-07 LAB — URINALYSIS, ROUTINE W REFLEX MICROSCOPIC
Hgb urine dipstick: NEGATIVE
Nitrite: NEGATIVE
Protein, ur: NEGATIVE
Specific Gravity, Urine: 1.025
Urobilinogen, UA: 1

## 2011-04-07 LAB — CBC
MCV: 89.9
Platelets: 179
WBC: 5.8

## 2011-04-07 LAB — DIFFERENTIAL
Eosinophils Absolute: 0
Lymphs Abs: 1
Neutro Abs: 4.5
Neutrophils Relative %: 77

## 2011-04-07 LAB — PREGNANCY, URINE: Preg Test, Ur: NEGATIVE

## 2011-04-09 LAB — POCT PREGNANCY, URINE
Operator id: 29011
Preg Test, Ur: NEGATIVE

## 2011-04-14 LAB — COMPREHENSIVE METABOLIC PANEL
AST: 17
Albumin: 3.9
Calcium: 9
Creatinine, Ser: 0.56
GFR calc Af Amer: >60

## 2011-04-14 LAB — URINALYSIS, ROUTINE W REFLEX MICROSCOPIC
Bilirubin Urine: NEGATIVE
Ketones, ur: NEGATIVE
Nitrite: NEGATIVE
Specific Gravity, Urine: 1.031 — ABNORMAL HIGH
Urobilinogen, UA: 0.2

## 2011-04-14 LAB — CBC
MCHC: 34.4
MCV: 86.1
Platelets: 254
WBC: 6.8

## 2011-04-14 LAB — DIFFERENTIAL
Eosinophils Relative: 3
Lymphocytes Relative: 35
Lymphs Abs: 2.4
Monocytes Absolute: 0.5
Monocytes Relative: 8

## 2011-04-19 LAB — URINE MICROSCOPIC-ADD ON

## 2011-04-19 LAB — URINALYSIS, ROUTINE W REFLEX MICROSCOPIC
Bilirubin Urine: NEGATIVE
Nitrite: NEGATIVE
Specific Gravity, Urine: 1.022
pH: 7.5

## 2011-04-19 LAB — URINE CULTURE: Colony Count: >=100000

## 2011-04-20 LAB — URINALYSIS, ROUTINE W REFLEX MICROSCOPIC
Bilirubin Urine: NEGATIVE
Glucose, UA: NEGATIVE
Ketones, ur: NEGATIVE
Leukocytes, UA: NEGATIVE
Nitrite: NEGATIVE
Protein, ur: 30 — AB
Specific Gravity, Urine: 1.022
Urobilinogen, UA: 0.2
pH: 8

## 2011-04-20 LAB — URINE MICROSCOPIC-ADD ON

## 2011-04-20 LAB — PREGNANCY, URINE: Preg Test, Ur: NEGATIVE

## 2011-04-22 LAB — URINALYSIS, ROUTINE W REFLEX MICROSCOPIC
Bilirubin Urine: NEGATIVE
Ketones, ur: NEGATIVE
Nitrite: NEGATIVE
Protein, ur: NEGATIVE
Specific Gravity, Urine: 1.027
Urobilinogen, UA: 0.2

## 2011-04-22 LAB — PREGNANCY, URINE: Preg Test, Ur: NEGATIVE

## 2011-04-23 LAB — CBC
MCHC: 33.5
MCV: 88.6
Platelets: 195
RDW: 14.3 — ABNORMAL HIGH

## 2011-04-23 LAB — URINALYSIS, ROUTINE W REFLEX MICROSCOPIC
Nitrite: NEGATIVE
Protein, ur: >300 — AB
Specific Gravity, Urine: 1.016
Urobilinogen, UA: 0.2
Urobilinogen, UA: 0.2

## 2011-04-23 LAB — DIFFERENTIAL
Lymphocytes Relative: 19
Lymphs Abs: 1.3
Neutrophils Relative %: 76

## 2011-04-23 LAB — URINE CULTURE

## 2011-04-23 LAB — COMPREHENSIVE METABOLIC PANEL
AST: 19
Albumin: 3.8
BUN: 6
Calcium: 9.1
Creatinine, Ser: 0.82
GFR calc Af Amer: >60

## 2011-04-23 LAB — URINE MICROSCOPIC-ADD ON

## 2011-04-23 LAB — PREGNANCY, URINE: Preg Test, Ur: NEGATIVE

## 2011-04-23 LAB — LIPASE, BLOOD: Lipase: 45

## 2011-04-27 LAB — URINALYSIS, ROUTINE W REFLEX MICROSCOPIC
Glucose, UA: NEGATIVE
Ketones, ur: NEGATIVE
Leukocytes, UA: NEGATIVE
Protein, ur: NEGATIVE

## 2012-05-10 ENCOUNTER — Emergency Department (HOSPITAL_COMMUNITY)
Admission: EM | Admit: 2012-05-10 | Discharge: 2012-05-10 | Disposition: A | Discharge status: Home / Self Care | Payer: Self-pay | Attending: Emergency Medicine | Admitting: Emergency Medicine

## 2012-05-10 ENCOUNTER — Encounter (HOSPITAL_COMMUNITY): Payer: Self-pay | Admitting: Emergency Medicine

## 2012-05-10 DIAGNOSIS — G43909 Migraine, unspecified, not intractable, without status migrainosus: Secondary | ICD-10-CM | POA: Insufficient documentation

## 2012-05-10 DIAGNOSIS — Z8742 Personal history of other diseases of the female genital tract: Secondary | ICD-10-CM | POA: Insufficient documentation

## 2012-05-10 HISTORY — DX: Migraine, unspecified, not intractable, without status migrainosus: G43.909

## 2012-05-10 HISTORY — DX: Endometriosis, unspecified: N80.9

## 2012-05-10 MED ORDER — SODIUM CHLORIDE 0.9 % IV BOLUS (SEPSIS)
1000.0000 mL | Freq: Once | INTRAVENOUS | Status: AC
  Administered 2012-05-10: 1000 mL via INTRAVENOUS

## 2012-05-10 MED ORDER — SUMATRIPTAN SUCCINATE 50 MG PO TABS: 50.0000 mg | ORAL_TABLET | ORAL | Status: DC | PRN

## 2012-05-10 MED ORDER — DIPHENHYDRAMINE HCL 50 MG/ML IJ SOLN
25.0000 mg | Freq: Once | INTRAMUSCULAR | Status: AC
  Administered 2012-05-10: 25 mg via INTRAVENOUS
  Filled 2012-05-10: qty 1

## 2012-05-10 MED ORDER — NAPROXEN 500 MG PO TABS: 500.0000 mg | ORAL_TABLET | Freq: Two times a day (BID) | ORAL | Status: DC

## 2012-05-10 MED ORDER — IBUPROFEN 200 MG PO TABS
600.0000 mg | ORAL_TABLET | Freq: Once | ORAL | Status: AC
  Administered 2012-05-10: 600 mg via ORAL
  Filled 2012-05-10: qty 3

## 2012-05-10 MED ORDER — KETOROLAC TROMETHAMINE 30 MG/ML IJ SOLN: 30.0000 mg | Freq: Once | INTRAMUSCULAR | Status: DC

## 2012-05-10 MED ORDER — METOCLOPRAMIDE HCL 5 MG/ML IJ SOLN
10.0000 mg | Freq: Once | INTRAMUSCULAR | Status: AC
  Administered 2012-05-10: 10 mg via INTRAVENOUS
  Filled 2012-05-10: qty 2

## 2012-05-10 NOTE — ED Notes (Signed)
Pt woke up with frontal HA that radiates to R side accompanied by sensitivity to light and noise.  Pt has hx of migraines.  Pt states she had one yesterday that got better, but had not had a HA for a long time before that.  Pt took tylenol sinus an hour ago with no relief.  Pt states she had a depo shot 10/21 which has caused migraines in the past.  Initially rated pain 8/10 upon EMS arrival.  EMS gave 4mg  zofran and total fentanyl which reduced pain to 2/10.

## 2012-05-10 NOTE — ED Provider Notes (Signed)
History     CSN: [id number]  Arrival date & time 05/10/12  6440   First MD Initiated Contact with Patient 05/10/12 [phone]      Chief Complaint  Patient presents with  . Migraine    (Consider location/radiation/quality/duration/timing/severity/associated sxs/prior treatment) Patient is a 26 y.o. female presenting with migraines. The history is provided by the patient.  Migraine This is a recurrent problem. The problem occurs constantly. The problem has not changed since onset.Associated symptoms include headaches. Pertinent negatives include no chest pain, no abdominal pain and no shortness of breath. Exacerbated by: light. Nothing relieves the symptoms.   patient states the headache is on the right side primarily behind her eye. This is similar to prior migraines. Usually she takes Tylenol Sinus and try that about an hour ago without any improvement. She called 911 and was brought in by ambulance. EMS gave her 4 mg of Zofran as well as 100 mcg of fentanyl. the pain is now decreased from an 8/10 to a 2 out of 10.  She denies any fevers or recent injuries. She denies any weakness. She does feel like she has a little bit numbness in her hand and she has noticed  A visual aura.  Past Medical History  Diagnosis Date  . Migraine   . Endometriosis     History reviewed. No pertinent past surgical history.  History reviewed. No pertinent family history.  History  Substance Use Topics  . Smoking status: Never Smoker   . Smokeless tobacco: Not on file  . Alcohol Use: No    OB History    Grav Para Term Preterm Abortions TAB SAB Ect Mult Living                  Review of Systems  Respiratory: Negative for shortness of breath.   Cardiovascular: Negative for chest pain.  Gastrointestinal: Negative for abdominal pain.  Neurological: Positive for headaches.  All other systems reviewed and are negative.    Allergies  Review of patient's allergies indicates no known  allergies.  Home Medications   Current Outpatient Rx  Name Route Sig Dispense Refill  . ONE-DAILY MULTI VITAMINS PO TABS Oral Take 1 tablet by mouth daily.        BP 109/55  Pulse 93  Temp 98.1 F (36.7 C) (Oral)  Resp 18  SpO2 98%  Physical Exam  Nursing note and vitals reviewed. Constitutional: She is oriented to person, place, and time. She appears well-developed and well-nourished. No distress.  HENT:  Head: Normocephalic and atraumatic.  Right Ear: External ear normal.  Left Ear: External ear normal.  Mouth/Throat: Oropharynx is clear and moist.  Eyes: Conjunctivae normal and EOM are normal. Right eye exhibits no discharge. Left eye exhibits no discharge. No scleral icterus.  Neck: Neck supple. No tracheal deviation present.  Cardiovascular: Normal rate, regular rhythm and intact distal pulses.   Pulmonary/Chest: Effort normal and breath sounds normal. No stridor. No respiratory distress. She has no wheezes. She has no rales.  Abdominal: Soft. Bowel sounds are normal. She exhibits no distension. There is no tenderness. There is no rebound and no guarding.  Musculoskeletal: She exhibits no edema and no tenderness.  Neurological: She is alert and oriented to person, place, and time. She has normal strength. No cranial nerve deficit ( no gross defecits noted) or sensory deficit. She exhibits normal muscle tone. She displays no seizure activity. Coordination normal.       No pronator drift bilateral upper extrem, ,  sensation intact in all extremities, no visual field cuts, no left or right sided neglect  Skin: Skin is warm and dry. No rash noted.  Psychiatric: She has a normal mood and affect.    ED Course  Procedures (including critical care time)  Labs Reviewed - No data to display No results found.   1. Migraine headache       MDM  Patient improved in the emergency department with Reglan, and Benadryl. She had been given fentanyl by EMS. He was also given a dose  of ibuprofen here in emergent apartment. At this time I do not feel there is any evidence to suggest stroke, acute hemorrhage or meningitis. Discussed with her following up with primary care Dr. I will give her prescription for Imitrex.At this time there does not appear to be any evidence of an acute emergency medical condition and the patient appears stable for discharge with appropriate outpatient follow up.         Celene Kras, MD 05/10/12 [phone]

## 2012-07-03 ENCOUNTER — Emergency Department (HOSPITAL_COMMUNITY)
Admission: EM | Admit: 2012-07-03 | Discharge: 2012-07-03 | Disposition: A | Discharge status: Home / Self Care | Payer: Self-pay | Attending: Emergency Medicine | Admitting: Emergency Medicine

## 2012-07-03 ENCOUNTER — Encounter (HOSPITAL_COMMUNITY): Payer: Self-pay | Admitting: Emergency Medicine

## 2012-07-03 DIAGNOSIS — Z79899 Other long term (current) drug therapy: Secondary | ICD-10-CM | POA: Insufficient documentation

## 2012-07-03 DIAGNOSIS — Z3202 Encounter for pregnancy test, result negative: Secondary | ICD-10-CM | POA: Insufficient documentation

## 2012-07-03 DIAGNOSIS — N390 Urinary tract infection, site not specified: Secondary | ICD-10-CM | POA: Insufficient documentation

## 2012-07-03 DIAGNOSIS — Z8679 Personal history of other diseases of the circulatory system: Secondary | ICD-10-CM | POA: Insufficient documentation

## 2012-07-03 DIAGNOSIS — Z87448 Personal history of other diseases of urinary system: Secondary | ICD-10-CM | POA: Insufficient documentation

## 2012-07-03 DIAGNOSIS — Z8742 Personal history of other diseases of the female genital tract: Secondary | ICD-10-CM | POA: Insufficient documentation

## 2012-07-03 HISTORY — DX: Urinary tract infection, site not specified: N39.0

## 2012-07-03 HISTORY — DX: Cystitis, unspecified without hematuria: N30.90

## 2012-07-03 LAB — URINALYSIS, ROUTINE W REFLEX MICROSCOPIC
Glucose, UA: NEGATIVE mg/dL
Hgb urine dipstick: NEGATIVE
Specific Gravity, Urine: 1.026 (ref 1.005–1.030)

## 2012-07-03 LAB — URINE MICROSCOPIC-ADD ON

## 2012-07-03 LAB — POCT PREGNANCY, URINE: Preg Test, Ur: NEGATIVE

## 2012-07-03 MED ORDER — SULFAMETHOXAZOLE-TRIMETHOPRIM 800-160 MG PO TABS: 1.0000 | ORAL_TABLET | Freq: Two times a day (BID) | ORAL | Status: DC

## 2012-07-03 MED ORDER — PHENAZOPYRIDINE HCL 200 MG PO TABS: 200.0000 mg | ORAL_TABLET | Freq: Three times a day (TID) | ORAL | Status: DC

## 2012-07-03 NOTE — Progress Notes (Signed)
During pt 07/03/12 Ed visit she confirmed she see Dr Haywood Pao at [facility] EPIC updated

## 2012-07-03 NOTE — ED Provider Notes (Signed)
History     CSN: [id number]  Arrival date & time 07/03/12  1038   First MD Initiated Contact with Patient 07/03/12 1130      Chief Complaint  Patient presents with  . Dysuria    (Consider location/radiation/quality/duration/timing/severity/associated sxs/prior treatment) HPI  Pt to the ER with complaints of dysuria. She has been having discomfort for 3-4 days. Gets them once every other year. Denies back pain, fevers, chills, nausea, vomiting, diarrhea, hematuria or weakness. nad vss  Past Medical History  Diagnosis Date  . Migraine   . Endometriosis   . UTI (lower urinary tract infection)   . Bladder infection     History reviewed. No pertinent past surgical history.  No family history on file.  History  Substance Use Topics  . Smoking status: Never Smoker   . Smokeless tobacco: Not on file  . Alcohol Use: No    OB History    Grav Para Term Preterm Abortions TAB SAB Ect Mult Living                  Review of Systems  Review of Systems  Gen: no weight loss, fevers, chills, night sweats  Eyes: no discharge or drainage, no occular pain or visual changes  Nose: no epistaxis or rhinorrhea  Mouth: no dental pain, no sore throat  Neck: no neck pain  Lungs:No wheezing, coughing or hemoptysis CV: no chest pain, palpitations, dependent edema or orthopnea  Abd: no abdominal pain, nausea, vomiting  GU: + dysuria no gross hematuria  MSK:  No abnormalities  Neuro: no headache, no focal neurologic deficits  Skin: no abnormalities Psyche: negative.   Allergies  Toradol and Vicodin  Home Medications   Current Outpatient Rx  Name  Route  Sig  Dispense  Refill  . PHENAZOPYRIDINE HCL 95 MG PO TABS   Oral   Take 95 mg by mouth 3 (three) times daily as needed. For bladder pain         . PSEUDOEPHEDRINE-ACETAMINOPHEN 30-500 MG PO TABS   Oral   Take 1 tablet by mouth every 4 (four) hours as needed. pain         . PHENAZOPYRIDINE HCL 200 MG PO TABS   Oral  Take 1 tablet (200 mg total) by mouth 3 (three) times daily.   6 tablet   0   . SULFAMETHOXAZOLE-TRIMETHOPRIM 800-160 MG PO TABS   Oral   Take 1 tablet by mouth every 12 (twelve) hours.   10 tablet   0     BP 105/68  Pulse 80  Temp 98.5 F (36.9 C) (Oral)  Resp 16  SpO2 100%  Physical Exam  Nursing note and vitals reviewed. Constitutional: She appears well-developed and well-nourished. No distress.  HENT:  Head: Normocephalic and atraumatic.  Eyes: Pupils are equal, round, and reactive to light.  Neck: Normal range of motion. Neck supple.  Cardiovascular: Normal rate and regular rhythm.   Pulmonary/Chest: Effort normal.  Abdominal: Soft. There is no tenderness. There is no rebound and no guarding.  Neurological: She is alert.  Skin: Skin is warm and dry.    ED Course  Procedures (including critical care time)  Labs Reviewed  URINALYSIS, ROUTINE W REFLEX MICROSCOPIC - Abnormal; Notable for the following:    Color, Urine ORANGE (*)  BIOCHEMICALS MAY BE AFFECTED BY COLOR   APPearance CLOUDY (*)     Ketones, ur TRACE (*)     Nitrite POSITIVE (*)     Leukocytes, UA  SMALL (*)     All other components within normal limits  URINE MICROSCOPIC-ADD ON - Abnormal; Notable for the following:    Bacteria, UA MANY (*)     All other components within normal limits  POCT PREGNANCY, URINE  URINE CULTURE   No results found.   1. UTI (lower urinary tract infection)       MDM  Pt has already taken OTC Azo for two days.  Rx Bactrim.  Pt has been advised of the symptoms that warrant their return to the ED. Patient has voiced understanding and has agreed to follow-up with the PCP or specialist.         Dorthula Matas, PA 07/03/12 1606

## 2012-07-03 NOTE — ED Notes (Signed)
Per patient, has history of bladder infections, having some urgency, and discomfort with urination

## 2012-07-05 LAB — URINE CULTURE

## 2012-07-05 NOTE — ED Provider Notes (Signed)
Medical screening examination/treatment/procedure(s) were performed by non-physician practitioner and as supervising physician I was immediately available for consultation/collaboration.  Juliet Rude. Rubin Payor, MD 07/05/12 1537

## 2013-04-10 ENCOUNTER — Inpatient Hospital Stay (HOSPITAL_COMMUNITY)
Admission: AD | Admit: 2013-04-10 | Discharge: 2013-04-10 | Disposition: A | Discharge status: Home / Self Care | Payer: Self-pay | Source: Ambulatory Visit | Attending: Obstetrics and Gynecology | Admitting: Obstetrics and Gynecology

## 2013-04-10 ENCOUNTER — Inpatient Hospital Stay (HOSPITAL_COMMUNITY): Payer: Self-pay

## 2013-04-10 DIAGNOSIS — K5900 Constipation, unspecified: Secondary | ICD-10-CM | POA: Insufficient documentation

## 2013-04-10 DIAGNOSIS — B373 Candidiasis of vulva and vagina: Secondary | ICD-10-CM

## 2013-04-10 DIAGNOSIS — N912 Amenorrhea, unspecified: Secondary | ICD-10-CM | POA: Insufficient documentation

## 2013-04-10 DIAGNOSIS — B3731 Acute candidiasis of vulva and vagina: Secondary | ICD-10-CM | POA: Insufficient documentation

## 2013-04-10 DIAGNOSIS — R109 Unspecified abdominal pain: Secondary | ICD-10-CM | POA: Insufficient documentation

## 2013-04-10 DIAGNOSIS — N949 Unspecified condition associated with female genital organs and menstrual cycle: Secondary | ICD-10-CM | POA: Insufficient documentation

## 2013-04-10 DIAGNOSIS — R102 Pelvic and perineal pain: Secondary | ICD-10-CM

## 2013-04-10 LAB — URINALYSIS, ROUTINE W REFLEX MICROSCOPIC
Leukocytes, UA: NEGATIVE
Nitrite: NEGATIVE
Specific Gravity, Urine: >1.03 — ABNORMAL HIGH (ref 1.005–1.030)
pH: 6 (ref 5.0–8.0)

## 2013-04-10 LAB — WET PREP, GENITAL

## 2013-04-10 LAB — POCT PREGNANCY, URINE: Preg Test, Ur: NEGATIVE

## 2013-04-10 MED ORDER — OXYCODONE-ACETAMINOPHEN 5-325 MG PO TABS
2.0000 | ORAL_TABLET | Freq: Once | ORAL | Status: AC
  Administered 2013-04-10: 2 via ORAL

## 2013-04-10 MED ORDER — FLUCONAZOLE 150 MG PO TABS
150.0000 mg | ORAL_TABLET | Freq: Once | ORAL | Status: AC
  Administered 2013-04-10: 150 mg via ORAL

## 2013-04-10 MED ORDER — DOCUSATE SODIUM 100 MG PO CAPS: 100.0000 mg | ORAL_CAPSULE | Freq: Two times a day (BID) | ORAL | Status: DC | PRN

## 2013-04-10 MED ORDER — FLUCONAZOLE 150 MG PO TABS: ORAL_TABLET | ORAL | Status: DC

## 2013-04-10 MED ORDER — OXYCODONE-ACETAMINOPHEN 5-325 MG PO TABS
ORAL_TABLET | ORAL | Status: AC
  Administered 2013-04-10: 2 via ORAL
  Filled 2013-04-10: qty 2

## 2013-04-10 MED ORDER — FLUCONAZOLE 150 MG PO TABS
ORAL_TABLET | ORAL | Status: AC
  Administered 2013-04-10: 150 mg via ORAL
  Filled 2013-04-10: qty 1

## 2013-04-10 NOTE — MAU Provider Note (Signed)
History     CSN: [id number]  Arrival date and time: 04/10/13 1643   First Provider Initiated Contact with Patient 04/10/13 1818      Chief Complaint  Patient presents with  . Abdominal Pain  . Constipation   HPI [name] is a 27 y.o. female who presents to MAU today with complaint of abdominal pain that started at 1615 today as sharp right suprapubic pain. The patient states that while she was in the ambulance on her way to MAU the pain became more midline suprapubic pain. At onset the pain was rated at 8/10. The patient took 400 mg Advil with minimal relief. The patient states that the pain now is rated at 5/10 and more constant with occasional sharp pains rated at 7/10. She denies UTI symptoms, fever, N/V/D, vaginal bleeding or discharge. The patient has a history of endometriosis, umbilical hernia repair and ovarian cysts. She is on Depo Provera x 4 years and does not have periods.   OB History   Grav Para Term Preterm Abortions TAB SAB Ect Mult Living                  Past Medical History  Diagnosis Date  . Migraine   . Endometriosis   . UTI (lower urinary tract infection)   . Bladder infection     No past surgical history on file.  No family history on file.  History  Substance Use Topics  . Smoking status: Never Smoker   . Smokeless tobacco: Not on file  . Alcohol Use: No    Allergies:  Allergies  Allergen Reactions  . Toradol [Ketorolac Tromethamine] Anxiety    suicidal  . Vicodin [Hydrocodone-Acetaminophen] Nausea And Vomiting    Prescriptions prior to admission  Medication Sig Dispense Refill  . [DISCONTINUED] phenazopyridine (AZO-STANDARD) 95 MG tablet Take 95 mg by mouth 3 (three) times daily as needed. For bladder pain      . [DISCONTINUED] phenazopyridine (PYRIDIUM) 200 MG tablet Take 1 tablet (200 mg total) by mouth 3 (three) times daily.  6 tablet  0  . [DISCONTINUED] pseudoephedrine-acetaminophen (TYLENOL SINUS) 30-500 MG TABS Take  1 tablet by mouth every 4 (four) hours as needed. pain      . [DISCONTINUED] sulfamethoxazole-trimethoprim (SEPTRA DS) 800-160 MG per tablet Take 1 tablet by mouth every 12 (twelve) hours.  10 tablet  0    Review of Systems  Constitutional: Negative for fever and malaise/fatigue.  Gastrointestinal: Positive for abdominal pain. Negative for nausea, vomiting, diarrhea and constipation.  Genitourinary: Negative for dysuria, urgency and frequency.       Neg - vaginal bleeding, discharge   Physical Exam   Blood pressure 115/67, pulse 86, temperature 98.8 F (37.1 C), temperature source Oral, resp. rate 16, height 5\' 7"  (1.702 m), weight 124 lb (56.246 kg), SpO2 100.00%.  Physical Exam  Constitutional: She is oriented to person, place, and time. She appears well-developed and well-nourished. No distress.  HENT:  Head: Normocephalic and atraumatic.  Cardiovascular: Normal rate, regular rhythm and normal heart sounds.   Respiratory: Effort normal and breath sounds normal. No respiratory distress.  GI: Soft. Bowel sounds are normal. She exhibits no distension and no mass. There is tenderness (moderate tenderness to palpation of the suprapubic region). There is no rebound and no guarding.  Genitourinary: Uterus is tender. Uterus is not enlarged. Cervix exhibits no motion tenderness, no discharge and no friability. Right adnexum displays tenderness. Right adnexum displays no mass. Left adnexum displays  no mass and no tenderness. No bleeding around the vagina. Vaginal discharge (copious amounts of thick white discharge coating the walls of the vagina) found.  Neurological: She is alert and oriented to person, place, and time.  Skin: Skin is warm and dry. No erythema.  Psychiatric: She has a normal mood and affect.   Results for orders placed during the hospital encounter of 04/10/13 (from the past 24 hour(s))  URINALYSIS, ROUTINE W REFLEX MICROSCOPIC     Status: Abnormal   Collection Time     04/10/13  5:30 PM      Result Value Range   Color, Urine YELLOW  YELLOW   APPearance CLEAR  CLEAR   Specific Gravity, Urine >1.030 (*) 1.005 - 1.030   pH 6.0  5.0 - 8.0   Glucose, UA NEGATIVE  NEGATIVE mg/dL   Hgb urine dipstick NEGATIVE  NEGATIVE   Bilirubin Urine NEGATIVE  NEGATIVE   Ketones, ur 15 (*) NEGATIVE mg/dL   Protein, ur NEGATIVE  NEGATIVE mg/dL   Urobilinogen, UA 1.0  0.0 - 1.0 mg/dL   Nitrite NEGATIVE  NEGATIVE   Leukocytes, UA NEGATIVE  NEGATIVE  POCT PREGNANCY, URINE     Status: None   Collection Time    04/10/13  6:02 PM      Result Value Range   Preg Test, Ur NEGATIVE  NEGATIVE  WET PREP, GENITAL     Status: Abnormal   Collection Time    04/10/13  6:40 PM      Result Value Range   Yeast Wet Prep HPF POC NONE SEEN  NONE SEEN   Trich, Wet Prep NONE SEEN  NONE SEEN   Clue Cells Wet Prep HPF POC NONE SEEN  NONE SEEN   WBC, Wet Prep HPF POC FEW (*) NONE SEEN   US Transvaginal Non-ob  04/10/2013   CLINICAL DATA:  Suprapubic pain  EXAM: TRANSABDOMINAL AND TRANSVAGINAL ULTRASOUND OF PELVIS  TECHNIQUE: Both transabdominal and transvaginal ultrasound examinations of the pelvis were performed. Transabdominal technique was performed for global imaging of the pelvis including uterus, ovaries, adnexal regions, and pelvic cul-de-sac. It was necessary to proceed with endovaginal exam following the transabdominal exam to visualize the bilateral ovaries.  COMPARISON:  10/21/2008  FINDINGS: Uterus  Measurements: 5.0 x 2.2 x 2.7 cm. No fibroids or other mass visualized.  Endometrium  Thickness: 11 mm.  No focal abnormality visualized.  Right ovary  Measurements: 2.9 x 2.3 x 1.6 cm. Normal appearance/no adnexal mass.  Left ovary  Measurements: 3.0 x 0.8 x 2.0 cm. Normal appearance/no adnexal mass.  Other findings  No free fluid.  IMPRESSION: Negative pelvic ultrasound.   Electronically Signed   By: Charline Bills M.D.   On: 04/10/2013 19:52   US Pelvis Complete  04/10/2013   CLINICAL  DATA:  Suprapubic pain  EXAM: TRANSABDOMINAL AND TRANSVAGINAL ULTRASOUND OF PELVIS  TECHNIQUE: Both transabdominal and transvaginal ultrasound examinations of the pelvis were performed. Transabdominal technique was performed for global imaging of the pelvis including uterus, ovaries, adnexal regions, and pelvic cul-de-sac. It was necessary to proceed with endovaginal exam following the transabdominal exam to visualize the bilateral ovaries.  COMPARISON:  10/21/2008  FINDINGS: Uterus  Measurements: 5.0 x 2.2 x 2.7 cm. No fibroids or other mass visualized.  Endometrium  Thickness: 11 mm.  No focal abnormality visualized.  Right ovary  Measurements: 2.9 x 2.3 x 1.6 cm. Normal appearance/no adnexal mass.  Left ovary  Measurements: 3.0 x 0.8 x 2.0 cm. Normal appearance/no  adnexal mass.  Other findings  No free fluid.  IMPRESSION: Negative pelvic ultrasound.   Electronically Signed   By: Charline Bills M.D.   On: 04/10/2013 19:52    MAU Course  Procedures None  MDM UPT - negative UA, Wet prep, GC/Chlamydia and [place] today Percocet given in MAU for pain  Assessment and Plan  A: Yeast vulvovaginitis, clinical Pelvic pain in female Amenorrhea secondary to Depo Provera Constipation, occasional  P: Discharge home Rx for Diflucan and Colace sent to patients pharmacy Patient advised to keep appointment rescheduled for Friday to get Depo Provera Patient may return to MAU as needed or if her condition were to change or worsen  Freddi Starr, PA-C  04/10/2013, 8:10 PM

## 2013-04-10 NOTE — MAU Note (Signed)
Patient arrived by EMS. Patient states she had sudden onset of right lower abdominal pain that was sharp. Has now radiated to the lower abdomen. Was due her Depo Provera today at [facility]. States she had a BM during the night that she feels was not all her stool. Denies bleeding or discharge, nausea or vomiting.

## 2013-04-11 LAB — GC/CHLAMYDIA PROBE AMP: CT Probe RNA: NEGATIVE

## 2013-05-31 ENCOUNTER — Ambulatory Visit: Payer: Self-pay

## 2013-07-16 ENCOUNTER — Ambulatory Visit: Payer: Self-pay | Admitting: Internal Medicine

## 2013-08-02 ENCOUNTER — Ambulatory Visit: Payer: Self-pay

## 2013-08-15 ENCOUNTER — Ambulatory Visit: Payer: No Typology Code available for payment source | Attending: Internal Medicine

## 2013-08-27 ENCOUNTER — Telehealth: Payer: Self-pay | Admitting: *Deleted

## 2013-08-27 NOTE — Telephone Encounter (Addendum)
Jane [name] [name] and left a message she was a patient here, used to get depo provera shots here. States she wants to know if there is anyway we can write her a prescription to get a dose of depoprovera to be administered while she is out of  [place] for  A few months. States we have done that for her once before.  Per chart review we have not seen her since 2011 in the clinic, in MAU in 03/2013.  Informed patient  Health department should be able to do this since they provide her shots- she states they willnot. I  Informed  I am not sure if we could do this, but if we can would need to get records . Informed her I would look into this and call her back.  I discussed with Dr. 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  [name] Jane [name] and informed her to get her records sent to us showing when she had her last annual exam and last few shots and when next shot is due,as well as when she needs rx filled and we will write the prescription. Jane [name] voices understanding.

## 2013-10-09 ENCOUNTER — Encounter: Payer: Self-pay | Admitting: Internal Medicine

## 2013-10-09 ENCOUNTER — Ambulatory Visit: Payer: No Typology Code available for payment source | Attending: Internal Medicine | Admitting: Internal Medicine

## 2013-10-09 VITALS — BP 110/74 | HR 74 | Temp 99.1°F | Resp 17 | Wt 123.8 lb

## 2013-10-09 DIAGNOSIS — R142 Eructation: Principal | ICD-10-CM | POA: Insufficient documentation

## 2013-10-09 DIAGNOSIS — M25519 Pain in unspecified shoulder: Secondary | ICD-10-CM

## 2013-10-09 DIAGNOSIS — R143 Flatulence: Principal | ICD-10-CM

## 2013-10-09 DIAGNOSIS — Z139 Encounter for screening, unspecified: Secondary | ICD-10-CM

## 2013-10-09 DIAGNOSIS — N803 Endometriosis of pelvic peritoneum, unspecified: Secondary | ICD-10-CM | POA: Insufficient documentation

## 2013-10-09 DIAGNOSIS — M25511 Pain in right shoulder: Secondary | ICD-10-CM | POA: Insufficient documentation

## 2013-10-09 DIAGNOSIS — R140 Abdominal distension (gaseous): Secondary | ICD-10-CM | POA: Insufficient documentation

## 2013-10-09 DIAGNOSIS — K029 Dental caries, unspecified: Secondary | ICD-10-CM | POA: Insufficient documentation

## 2013-10-09 DIAGNOSIS — R141 Gas pain: Secondary | ICD-10-CM | POA: Insufficient documentation

## 2013-10-09 LAB — CBC WITH DIFFERENTIAL/PLATELET
Basophils Absolute: 0 10*3/uL (ref 0.0–0.1)
Basophils Relative: 0 % (ref 0–1)
Eosinophils Absolute: 0 10*3/uL (ref 0.0–0.7)
Eosinophils Relative: 1 % (ref 0–5)
HCT: 40.1 % (ref 36.0–46.0)
Hemoglobin: 13.4 g/dL (ref 12.0–15.0)
LYMPHS ABS: 1.9 10*3/uL (ref 0.7–4.0)
Lymphocytes Relative: 42 % (ref 12–46)
MCH: 29.7 pg (ref 26.0–34.0)
MCHC: 33.4 g/dL (ref 30.0–36.0)
MCV: 88.9 fL (ref 78.0–100.0)
MONOS PCT: 6 % (ref 3–12)
Monocytes Absolute: 0.3 10*3/uL (ref 0.1–1.0)
NEUTROS PCT: 51 % (ref 43–77)
Neutro Abs: 2.3 10*3/uL (ref 1.7–7.7)
Platelets: 216 10*3/uL (ref 150–400)
RBC: 4.51 MIL/uL (ref 3.87–5.11)
RDW: 13.7 % (ref 11.5–15.5)
WBC: 4.6 10*3/uL (ref 4.0–10.5)

## 2013-10-09 LAB — COMPLETE METABOLIC PANEL WITH GFR
ALBUMIN: 4.3 g/dL (ref 3.5–5.2)
ALT: 11 U/L (ref 0–35)
AST: 13 U/L (ref 0–37)
Alkaline Phosphatase: 55 U/L (ref 39–117)
BILIRUBIN TOTAL: 0.6 mg/dL (ref 0.2–1.2)
BUN: 10 mg/dL (ref 6–23)
CO2: 24 meq/L (ref 19–32)
Calcium: 9.2 mg/dL (ref 8.4–10.5)
Chloride: 106 mEq/L (ref 96–112)
Creat: 0.8 mg/dL (ref 0.50–1.10)
Glucose, Bld: 80 mg/dL (ref 70–99)
Potassium: 4.5 mEq/L (ref 3.5–5.3)
SODIUM: 140 meq/L (ref 135–145)
TOTAL PROTEIN: 7 g/dL (ref 6.0–8.3)

## 2013-10-09 LAB — TSH: TSH: 1.301 u[IU]/mL (ref 0.350–4.500)

## 2013-10-09 MED ORDER — SIMETHICONE 80 MG PO CHEW: 80.0000 mg | CHEWABLE_TABLET | Freq: Four times a day (QID) | ORAL | Status: DC | PRN

## 2013-10-09 NOTE — Progress Notes (Signed)
Patient Demographics  Jane [name], is a 28 y.o. female  MWU:[id number]:[id number]  VOZ:[id number]  DOB - 21-Dec-1985  CC:  Chief Complaint  Patient presents with  . Establish Care       HPI: Jane [name] is a 28 y.o. female here today to establish medical care. Patient has history of with this and is on Depo-Provera shots every 3 months, patient reported to have lot of bloating sensation denies any nausea vomiting or any bleeding per rectum, she denies any diarrhea but her has semisolid stools, patient also reported to have chronic right shoulder pain denies any recent fall or trauma, patient is requesting referral to see a dentist since she has lot of dental cavities. Patient has No headache, No chest pain, No abdominal pain - No Nausea, No new weakness tingling or numbness, No Cough - SOB.  Allergies  Allergen Reactions  . Toradol [Ketorolac Tromethamine] Anxiety    suicidal  . Vicodin [Hydrocodone-Acetaminophen] Nausea And Vomiting   Past Medical History  Diagnosis Date  . Migraine   . Endometriosis   . UTI (lower urinary tract infection)   . Bladder infection    Current Outpatient Prescriptions on File Prior to Visit  Medication Sig Dispense Refill  . docusate sodium (COLACE) 100 MG capsule Take 1 capsule (100 mg total) by mouth 2 (two) times daily as needed for constipation.  60 capsule  0  . fluconazole (DIFLUCAN) 150 MG tablet Take 1 tab (150 mg) in 3 days  1 tablet  0   No current facility-administered medications on file prior to visit.   Family History  Problem Relation Age of Onset  . Hypertension Maternal Grandmother   . Stroke Maternal Grandmother   . Cancer Maternal Grandmother   . Cancer Paternal Grandmother    History   Social History  . Marital Status: Single    Spouse Name: N/A    Number of Children: N/A  . Years of Education: N/A   Occupational History  . Not on file.   Social History Main Topics  . Smoking status: Never Smoker   .  Smokeless tobacco: Not on file  . Alcohol Use: No  . Drug Use: No  . Sexual Activity: Not on file   Other Topics Concern  . Not on file   Social History Narrative  . No narrative on file    Review of Systems: Constitutional: Negative for fever, chills, diaphoresis, activity change, appetite change and fatigue. HENT: Negative for ear pain, nosebleeds, congestion, facial swelling, rhinorrhea, neck pain, neck stiffness and ear discharge.  Eyes: Negative for pain, discharge, redness, itching and visual disturbance. Respiratory: Negative for cough, choking, chest tightness, shortness of breath, wheezing and stridor.  Cardiovascular: Negative for chest pain, palpitations and leg swelling. Gastrointestinal: Negative for abdominal distention. Genitourinary: Negative for dysuria, urgency, frequency, hematuria, flank pain, decreased urine volume, difficulty urinating and dyspareunia.  Musculoskeletal: Negative for back pain, joint swelling, arthralgia and gait problem. Neurological: Negative for dizziness, tremors, seizures, syncope, facial asymmetry, speech difficulty, weakness, light-headedness, numbness and headaches.  Hematological: Negative for adenopathy. Does not bruise/bleed easily. Psychiatric/Behavioral: Negative for hallucinations, behavioral problems, confusion, dysphoric mood, decreased concentration and agitation.    Objective:   Filed Vitals:   10/09/13 1017  BP: 110/74  Pulse: 74  Temp: 99.1 F (37.3 C)  Resp: 17    Physical Exam: Constitutional: Patient appears well-developed and well-nourished. No distress. HENT: Normocephalic, atraumatic, External right and left ear normal. Oropharynx is clear and moist.  Dental cavities wisdom tooth. Eyes: Conjunctivae and EOM are normal. PERRLA, no scleral icterus. Neck: Normal ROM. Neck supple. No JVD. No tracheal deviation. No thyromegaly. CVS: RRR, S1/S2 +, no murmurs, no gallops, no carotid bruit.  Pulmonary: Effort and breath  sounds normal, no stridor, rhonchi, wheezes, rales.  Abdominal: Soft. BS +, no distension, tenderness, rebound or guarding.  Musculoskeletal: Normal range of motion. No edema and no tenderness.  Neuro: Alert. Normal reflexes, muscle tone coordination. No cranial nerve deficit. Skin: Skin is warm and dry. No rash noted. Not diaphoretic. No erythema. No pallor. Psychiatric: Normal mood and affect. Behavior, judgment, thought content normal.  Lab Results  Component Value Date   WBC 9.5 09/05/2008   HGB 14.6 02/26/2009   HCT 43.0 02/26/2009   MCV 92.4 09/05/2008   PLT 185 09/05/2008   Lab Results  Component Value Date   CREATININE 0.9 02/26/2009   BUN 8 02/26/2009   NA 138 02/26/2009   K 5.0 02/26/2009   CL 108 02/26/2009   CO2 27 02/26/2009    No results found for this basename: HGBA1C   Lipid Panel  No results found for this basename: chol, trig, hdl, cholhdl, vldl, ldlcalc       Assessment and plan:   1. Screening I have ordered baseline blood work.  - CBC with Differential - COMPLETE METABOLIC PANEL WITH GFR - TSH - Vit D  25 hydroxy (rtn osteoporosis monitoring)  2. Endometriosis of pelvic peritoneum Following up with the GYN.  3. Dental cavities  - Ambulatory referral to Dentistry  4. Abdominal bloating Trial of simethicone. - simethicone (GAS-X) 80 MG chewable tablet; Chew 1 tablet (80 mg total) by mouth every 6 (six) hours as needed for flatulence.  Dispense: 30 tablet; Refill: 0  5. Right shoulder pain  - DG Shoulder Right; Future    Return in about 3 months (around 01/08/2014).       Doris Cheadle, MD

## 2013-10-09 NOTE — Progress Notes (Signed)
Patient here to establish care History of umbilical hernia that was repaired  On depo injection for  Endometriosis

## 2013-10-10 LAB — VITAMIN D 25 HYDROXY (VIT D DEFICIENCY, FRACTURES): Vit D, 25-Hydroxy: <10 ng/mL — ABNORMAL LOW (ref 30–89)

## 2013-10-11 ENCOUNTER — Telehealth: Payer: Self-pay

## 2013-10-11 MED ORDER — VITAMIN D (ERGOCALCIFEROL) 1.25 MG (50000 UNIT) PO CAPS: 50000.0000 [IU] | ORAL_CAPSULE | ORAL | Status: DC

## 2013-10-11 NOTE — Telephone Encounter (Signed)
Patient is aware of her lab results 

## 2013-10-11 NOTE — Telephone Encounter (Signed)
Message copied by Lestine MountJUAREZ, Aayush Gelpi L on Thu Oct 11, 2013  3:42 PM ------      Message from: Doris CheadleADVANI, DEEPAK      Created: Wed Oct 10, 2013 10:39 AM       Blood work reviewed, noticed low vitamin D, call patient advise to start ergocalciferol 50,000 units once a week for the duration of  12 weeks.       ------

## 2013-10-15 ENCOUNTER — Ambulatory Visit (HOSPITAL_COMMUNITY)
Admission: RE | Admit: 2013-10-15 | Discharge: 2013-10-15 | Disposition: A | Discharge status: Home / Self Care | Payer: No Typology Code available for payment source | Source: Ambulatory Visit | Attending: Internal Medicine | Admitting: Internal Medicine

## 2013-10-15 DIAGNOSIS — M25511 Pain in right shoulder: Secondary | ICD-10-CM

## 2013-10-15 DIAGNOSIS — M25519 Pain in unspecified shoulder: Secondary | ICD-10-CM | POA: Insufficient documentation

## 2013-10-16 ENCOUNTER — Telehealth: Payer: Self-pay

## 2013-10-16 NOTE — Telephone Encounter (Signed)
Message copied by Lestine MountJUAREZ, Ronon Ferger L on Tue Oct 16, 2013  9:21 AM ------      Message from: Doris CheadleADVANI, DEEPAK      Created: Mon Oct 15, 2013  5:08 PM       Call and let the patient know that her right shoulder x-ray was normal. ------

## 2013-10-16 NOTE — Telephone Encounter (Signed)
Patient is aware of her x ray results 

## 2013-11-19 ENCOUNTER — Telehealth: Payer: Self-pay | Admitting: *Deleted

## 2013-11-19 DIAGNOSIS — Z3049 Encounter for surveillance of other contraceptives: Secondary | ICD-10-CM

## 2013-11-19 MED ORDER — MEDROXYPROGESTERONE ACETATE 150 MG/ML IM SUSP: 150.0000 mg | INTRAMUSCULAR | Status: DC

## 2013-11-19 NOTE — Telephone Encounter (Signed)
Discussed with Dr. Macon LargeAnyanwu and prescription for 2 doses approved and sent to pharmacy

## 2013-11-19 NOTE — Telephone Encounter (Signed)
Jane [name] is calling back to see if she can get the prescription for depoprovera sent to her pharmacy to get it filled before she goes out of town-as she requested on previous phone call.  We have received received records from the health departmetn- she had her last physical 07/2013. Last depoprovera 104mg  10/17/13- next due 6/30-7/15/15.. Wants 2 dosed, not coming back until October. Informed Her I would discuss with Dr. Macon LargeAnyanwu and will send to pharmacy if approved, if not - will call her to discuss.

## 2013-11-20 NOTE — Telephone Encounter (Signed)
Called patient and informed her of medication available for pickup. Patient verbalized understanding and had no further questions

## 2013-11-23 ENCOUNTER — Ambulatory Visit: Payer: No Typology Code available for payment source | Attending: Internal Medicine | Admitting: *Deleted

## 2013-11-23 DIAGNOSIS — T753XXA Motion sickness, initial encounter: Secondary | ICD-10-CM

## 2013-11-23 MED ORDER — DIMENHYDRINATE 50 MG PO TABS: 50.0000 mg | ORAL_TABLET | Freq: Four times a day (QID) | ORAL | Status: DC | PRN

## 2013-11-23 MED ORDER — CIPROFLOXACIN HCL 500 MG PO TABS: 500.0000 mg | ORAL_TABLET | Freq: Two times a day (BID) | ORAL | Status: DC

## 2013-11-23 NOTE — Progress Notes (Unsigned)
Patient here today needing a prescription for Dramamine and Cipro since she is going on a cruise. Consulted with Dr. Hyman HopesJegede who prescribed Dramamine and Cipro.

## 2013-11-23 NOTE — Patient Instructions (Signed)
Traveling, Food and Drink Risks Unclean or not pure (contaminated) food and drink are common sources for bringing infection into the body, especially the digestive system (gastroenteritis). This can cause nausea, stomach cramping, diarrhea (with or without visible blood) and/or vomiting. Some of the common infections that travelers can get are:  Escherichia coli infections (E. coli).  Shigellosis or bacillary dysentery.  Giardiasis.  Campylobacter jejuni infections.  Cryptosporidiosis.  Hepatitis A.  Amebiasis. Other less common infectious disease risks for travelers include:  Typhoid fever and other salmonelloses.  Cholera.  Viral infections caused by rotavirus and noroviruses.  Various protozoan and helminthic (worm-like) parasites. Many of the infectious diseases transmitted in food and water can also be acquired when solid body wastes, or feces, contaminate food (fecal-oral route). WHAT IS TRAVELERS' DIARRHEA? The most common travel health problem is travelers' diarrhea. Between 20% and 50% of international travelers develop diarrhea each year. It often occurs in the first week of travel. However, it may occur at any time while traveling, or after returning home. The world is divided into three grades of risk:  Low-risk: [place], [place], [place], [place], [place], countries in [place] and [place].  Intermediate-risk: [place], [place], some of the [place].  High-risk: Most of [place], [place], [place], [place], [place] and [place]. In high risk places, often many people do not have access to plumbing or outhouses. The amount of contaminated stool is high and more open to flies. Poor electricity capacity can cause blackouts. This affects refrigeration, which may cause unsafe food storage. Lack of water supplies may mean a lack of sinks for hand washing by restaurant staff. People at greater risk include young adults, and those with low  immune response. This includes people with HIV/AIDS, or those taking medicines to decrease immunity. Also, people with inflammatory-bowel disease or diabetes, and people taking stomach medicines that reduce acid level (H-2 blockers or antacids). The leading cause of infection is consuming food or water contaminated with feces. Poor hygiene practice in local restaurants is thought to be the largest cause of travelers' diarrhea. The bacteria or germ that most often causes this illness is E. coli. WHAT ARE THE SYMPTOMS OF TRAVELERS' DIARRHEA? The illness often begins suddenly. It causes an increase in frequency, volume, and weight of stool. An affected person often has 4 to 5 loose, watery bowel movements each day. Other symptoms include nausea, vomiting, diarrhea, stomach cramping, bloating, fever, urgency, and general ill feeling (malaise). Most cases are not serious and go away in 1-2 days without treatment. Travelers' diarrhea is rarely life-threatening. (90% of cases resolve in 1 week. 98% resolve in 1 month.) PREVENTING TRAVELERS' DIARRHEA Reduce your exposure to potentially not pure water. Water that is chlorinated, using minimum water treatment standards used in the [place] protects against viral and bacterial (germs) diseases. Chlorine treatment alone might not kill some enteric (gut) viruses. It may not kill all infection causing parasites. Where chlorinated tap water is not available, or where hygiene and sanitation are poor, only the following might be safe to drink:   Beverages made with boiled water (tea, coffee).  Canned or bottled carbonated drinks (carbonated bottled water, soft drinks).  Beer.  Wine. When buying carbonated drinks or bottled water, always inspect the bottle seal. Make sure it has not been opened. This could mean it was refilled with unclean beverages. If you suspect a bottle seal has been tampered with, return or discard it.  Where water might be contaminated, ice could  be, also. Ice should not be used in beverages. If ice comes in contact with containers used for drinking, discard the ice. Thoroughly clean the containers, preferably with soap and hot water.  It is safer to drink a beverage directly from the can or bottle than from a questionable container. However, water on the outside of cans or bottles might not be pure. Dry wet cans or bottles before they are opened. Wipe clean the surfaces where your mouth will have contact. Also, avoid brushing your teeth with tap water.  PREVENTIVE TREATMENT OF WATER  The following methods can be used to treat water. This makes it safe for drinking and other purposes.   Boiling. This is the best method. Bring water to a rolling boil for 1 minute. Then allow it to cool to room temperature. Ice should not be added. Boiling will kill bacterial and parasitic causes of diarrhea at all altitudes. It will kill viruses at low altitudes. To kill viruses at altitudes above 2,000 meters (6,562 feet), water should be boiled for 3 minutes. Or chemical disinfection should be used after the water has boiled for 1 minute. To improve taste, add a pinch of salt to each quart. Or pour the water several times from one clean container to another.  Chemical disinfection. Iodine can be used, when boiling is not possible. However, this method might not kill all parasites, unless the water sits for 15 hours before it is drunk. Two well-tested methods for disinfection with iodine are the use of:  Tincture of iodine.  Tetraglycine hydroperiodide tablets. Examples are Globaline, Potable-Aqua, or Coghlan's. You can find these tablets at pharmacies and sporting goods stores. Follow the instructions on the label. If water is cloudy, double the number of tablets used. If water is extremely cold (less than 5 Celsius or 41 Fahrenheit), try to warm it. Increase the advised contact time to achieve reliable disinfection. Cloudy water should be strained through  a clean cloth into a container. This should remove floating matter. Then the water should be boiled. Or it can be treated with iodine. Chlorine, in various forms, can also be used for chemical disinfection. But its germ killing activity varies greatly with the acidity (pH), temperature, and content of the water. Chemically treated water is meant for short-term use only. Use iodine disinfected water for only a few weeks.   Portable filters provide various degrees of protection. Reverse-osmosis filters protect against viruses, bacteria (germs), and protozoa. However, they are expensive and large. The small pores on this type of filter get quickly plugged by muddy or cloudy water. The membranes in some filters can be damaged by chlorine. Microstrainer filters (0.1- to 0.3-micrometers), can remove bacteria and protozoa. But they do not remove viruses. To kill viruses, travelers should disinfect the water with iodine or chlorine after filtration. Filters with iodine-impregnated resins work best against bacteria. The iodine will kill some viruses. But the contact time with the iodine in the filter is too short to kill some parasites. To produce safe water, proper selection, operation, care, and maintenance of water filters is needed. Follow the instructions on the label.  As a last resort, if safe drinking water is not available, very hot tap water might be safer than cold tap water. But proper disinfection, filtering, or boiling is still advised. REDUCING RISK FROM FOOD  Reduce your exposure to potentially contaminated food. To avoid illness, travelers should select food with care. All raw food could be contaminated. Especially where hygiene and sanitation are  poor, avoid:  Salads.  Uncooked vegetables.  Unpeeled fruits or vegetables.  Unpasteurized milk and milk products, such as cheese.  Undercooked and raw meat, fish, and shellfish.  Eat only food that has been cooked and is still hot.  Eat fruit  that has been prepared by a food service provider who routinely caters to foreign travelers.  Cooked food that stands for hours at room temperature can have bacterial growth. It should be thoroughly reheated before serving. Avoid foods that may have been sitting for some time in the kitchen or in a buffet.  Do not eat food purchased from street vendors. Consuming food and beverages purchased from street vendors has been linked to increased risk of illness.  Eat at restaurants that often cater to foreign travelers (leading hotels, hotel chains).  To guarantee safe food for an infant younger than 47 months of age, breastfeed. If the infant has already been weaned from the breast, formula prepared from commercial powder and boiled water is the safest food.  Carry small containers of hand-sanitizing solutions or gels (with at least 60% alcohol). This makes it easier to clean your hands before eating.  Some fish and shellfish contain poisonous biotoxins (such as ciguatoxin), even when cooked. Jane [name] is the most toxin filled. It should always be avoided. Red snapper, grouper, amber jack, sea bass, and other tropical reef fish contain the toxin at various times. Poisoning potential exists in all areas where those fish are eaten. Especially in [place] areas of the [place], [place], and [place]. Symptoms of this poisoning include gastroenteritis. That is followed by:  Neurologic problems, such as dysesthesias (impaired senses, especially touch).  Temperature reversal.  Weakness.  Rarely, low blood pressure (hypotension).  Scombroid is another common fish poisoning. It occurs worldwide in tropical and temperate regions. Fish of the [place] family (bluefin, yellow fin tuna, mackerel, bonito), and some non-scombroid fish (mahi [place], herring, amber [place]) may contain high levels of histidine. With poor refrigeration or preservation, histidine turns into  histamine. This can cause:  Flushing (becoming red faced).  Headache.  Feeling sick to your stomach (nausea).  Vomiting.  Diarrhea.  Hives (urticaria). Cholera has occurred in people who ate crab brought back from [place]. Travelers should not bring perishable seafood with them when they return to the [place]' DIARRHEA  This illness often goes away without treatment. Oral rehydration (giving the body safe water and added packets of salt and sugar mixtures) can replace lost fluids and chemicals in the blood (electrolytes). This is especially important in children and people with longstanding (chronic) diseases. For severe fluid loss, the best replacement is with oral rehydration solutions (ORS), such as the WHO ORS solutions. These are widely available at stores and pharmacies in most developing countries.  When symptoms first occur, stop eating, and only drink clear liquids (only for adults) to help quiet the stomach. Travelers who develop 3 or more loose stools in an 8-hour period may benefit from antibiotic medicine. Especially if you also have nausea, vomiting, stomach cramps, fever, or blood in stools. Antibiotics are often given for 3-5 days. Some caregivers will prescribe antibiotics for people who are traveling to high risk places, to be used if and when symptoms begin. If diarrhea continues despite treatment, travelers should be evaluated by a caregiver and treated for possible parasitic infection. Control of frequent diarrhea is possible with over-the-counter medicines called anti-motility agents. These drugs reduce the amount of diarrhea by slowing  transit time in the stomach. This allows more time for food and water to be absorbed. It is thought that diarrhea may be a defense mechanism the body uses, to reduce the contact time between infection causing germs and the stomach. Anti-motility drugs decrease the duration of diarrhea. But they should never be used by  travelers with fever or bloody diarrhea. Such drugs can increase the severity of disease by slowing the removal of germs from the body. If excessively or not properly taken, anti-motility agents can cause serious illness on their own. Studies have found that people who follow the above eating rules still get ill sometimes. Antibiotic prevention before infection occurs is not advised for most travelers. Exceptions include those who have a high risk of serious health problems if treated after infection. However, studies have shown that the active ingredient in Pepto-Bismol (bismuth subsalicylate, BSS), taken regularly while traveling, can offer some protection against travelers' diarrhea. Ask your caregiver about this option before traveling, if desired. Do not use in children. This Information Courtesy of CDC. Document Released: 09/18/2002 Document Revised: 09/20/2011 Document Reviewed: 04/29/2009 [facility] Patient Information 2014 [place]. Immunization Information for Foreign Travel Immunizations can protect you from certain diseases. Immunizations can also prevent the spread of certain infections. It is important to see your caregiver or a travel medicine specialist 4 6 weeks before you travel. This allows time for vaccines to take effect. It also provides enough time for you to get vaccines that must be given in a series over a period of days or weeks. Immunizations for travelers include:  Routine vaccines. These vaccines are standard for the people in a country.  Recommended vaccines. These vaccines are recommended before travel to some countries or regions.  Required vaccines. These vaccines are necessary before travel to specific countries or regions. If it is less than 4 weeks before you leave, you should still see your caregiver. You might still benefit from vaccines or medicines. WHAT ARE THE ROUTINE VACCINES? Routine vaccines can protect you from diseases that are common in many parts  of the world. Most routine vaccines are given at specific ages during your life. However, routine vaccines also include the annual flu (influenza) vaccine. You should be up-to-date on your routine immunizations before you travel. Your caregiver will be able to review your vaccine history and determine whether you have had all the routine vaccines. You may be advised to get extra doses or booster vaccines even if you are up-to-date on the routine vaccines. WHAT ARE THE RECOMMENDED VACCINES? Know your travel schedule when you visit your caregiver. The vaccines recommended before foreign travel will depend on several factors, including:  The country or countries of travel.  Whether you will travel to rural areas.  The length of time you will be traveling.  The season of the year.  Your age.  Your health status.  Your previous immunizations. Vaccine recommendations change over time. Your caregiver can tell you what vaccines are recommended before your trip. The annual influenza vaccine sometimes differs for the [place] and [place] hemispheres. Unless the annual vaccines are the same in both hemispheres, people with certain chronic medical conditions who are traveling to the other hemisphere shortly before or during the influenza season should also get the other influenza vaccine. The other influenza vaccine should be obtained either before leaving the country or shortly after arrival at the travel site. WHAT ARE THE REQUIRED VACCINES? Vaccines may be required during a current outbreak of an infectious disease in  a country or region. Your caregiver will be able to tell you about any current outbreaks and required vaccines. For example, proof of yellow fever immunization is currently required for most people before traveling to certain countries in [place] and [place]. This vaccine can only be obtained at approved centers. You should get the yellow fever vaccine at least 10 days before your  trip. After 10 days, most people show immunity to yellow fever. If it has been longer than 10 years since you received the yellow fever vaccine, another dose is required. If proof of immunization is incomplete or inaccurate, you could be quarantined, denied entry, or given another dose of vaccine at the travel site. If you cannot receive the yellow fever vaccine because of medical reasons, you must have a written statement from your caregiver. The statement must contain a medical reason for the lack of immunization. In such a case, your caregiver should then give you advice on how to decrease your chance of getting yellow fever. That advice should include taking precautions to avoid mosquito bites and limiting outdoor time. Other than having a medical condition or being under the age of 61 months, no other reasons will be accepted for not getting the vaccine.  Proof of meningococcal immunization is required by the [facility] for any person older than 2 years who is taking part in the [place] or [place]. Visas for traveling to the hajj or [place] will not even be issued until there is proof of immunization. You should get this vaccine at least 10 days before your trip. After 10 days, most people show immunity. If it has been longer than 3 years since your last immunization, another dose is required. FOR MORE INFORMATION  Centers for Disease Control and Prevention (CDC): http://www.wolf.info/  World Health Organization [facility]): RoleLink.com.br Document Released: 06/16/2009 Document Revised: 06/14/2012 Document Reviewed: 05/26/2012 [facility] Patient Information 2014 [place], [place]. Traveling Outside the [place]  See your doctor at least 4 - 6 weeks before your trip. This allows time for immunizations to take effect. If it is less than 4 weeks before you leave, you should still see your caregiver. You might still benefit from shots or medicines and information about how to protect yourself while traveling.  Your  caregiver will ask you where you intend to travel, how long you intend to stay, and whether you may visit rural areas. This determines what vaccinations should be considered. Know your travel schedule when you visit your caregiver.  Adolescents and children should seek guidance on their vaccination status from their caregiver. So should women who are breastfeeding or pregnant, and people with altered immunity (HIV/AIDS, diabetes). CDC RECOMMENDS THE FOLLOWING VACCINES (AS NEEDED BY AGE AND BY WORLD REGION):  Routine Vaccines: Be up to date on your routine vaccinations. Get boosters, if needed. These include diphtheria, tetanus, and pertussis (DPT), measles, mumps, and rubella (MMR), influenza (flu), and varicella (chickenpox). Also, meningococcal, if you are 62 to 30 years of age, and zoster (shingles) if over age 72. Possibly pneumococcal, if you are a smoker or have long-term (chronic) lung or heart disease.  Typhoid: If you are visiting low income or developing countries.  Yellow Fever: If traveling to an area where the disease is prevalent (endemic).  HPV: (Human Papilloma Virus), if you are 29 years old or younger and intend to be sexually active.  Rabies: If you might be exposed to wild or domestic animals. Pre-exposure rabies vaccine is urged for people doing more  than short-term travel in countries where rabies is common (including [place]).  Polio: A single one-time booster is advised for travel to [place] and [place].  Hepatitis A: This is routinely given to children beginning at age 72 years. It is often advised for most foreign travel, including [place].  Hepatitis B: This is given routinely to infants, children, and adolescents.  Meningococcal: This is advised for travel to developing countries, where risk is high. For example, parts of sub-Saharan [place] ("meningitis belt"). [place] requires vaccine for all pilgrims attending the Hajj (religious travel to  [place]).  Malaria: A vaccine does not yet exist. Oral medicines can prevent the usual types of malaria and drug-resistant strains. The most common medicine prescribed is LARIAM (mefloquine). It is taken once weekly before, during, and after travel. Other drugs are also used for malaria prevention, including chloroquine and doxycycline.  Japanese B Encephalitis (JE): This is a moderately toxic vaccine. Use is generally limited to travelers to [place], who will have long rural exposure to mosquitoes, in areas with high likelihood of disease spreading (such as, rice paddies). TO STAY HEALTHY, DO:  Wash hands often, with soap and water.  Drink only bottled or boiled water, or carbonated (bubbly) drinks in cans or bottles. Avoid tap water, fountain drinks, and ice cubes. If not possible, make water safer by BOTH filtering through an "absolute 1-micron or less" filter AND adding iodine tablets. Such filters are found in camping and outdoor supply stores.  When buying carbonated drinks or bottled water, always inspect the bottle seal. Make sure it has not been previously opened. This could mean it was refilled with unclean beverages or water. If you suspect a bottle seal has been tampered with, return or discard it.  Eat only thoroughly cooked food from a reputable restaurant or food service provider, who routinely caters to foreign travelers. Only eat meat, fish, or shellfish that have been thoroughly cooked. Otherwise, they can infect you and cause gastroenteritis.  Avoid foods that have been prepared and left standing at room temperature. These often support bacterial growth that can make you ill.  If you will be visiting an area where there is risk for malaria, take your malaria prevention medicine before, during, and after travel, as directed. (See your doctor for a prescription.)  Protect yourself from mosquito bites:  Pay special attention to mosquito protection between dusk and dawn. This is when  malaria-carrying mosquitos are active.  Wear long-sleeved shirts, long pants, and hats.  Use insect repellants that contain DEET (diethylmethyltoluamide).  Read and follow the directions and precautions on the product label.  Apply insect repellent to all exposed skin.  Do not put repellent on wounds or broken skin.  Do not breathe in, swallow, or get DEET in your eyes. DEET is toxic if swallowed. If using a spray product, apply DEET to your face by spraying your hands and rubbing the product carefully over the face. Avoid your eyes and mouth.  Purchase a bed net impregnated (treated) with the insecticide permethrin or deltamethrin. Or, spray the bed net with one of these insecticides. This is not needed if you are staying in air-conditioned or well-screened housing.  DEET may be used on adults, children, and infants older than 56 months of age. Protect infants by using a carrier draped with mosquito netting, with an elastic edge for a tight fit.  Children under 62 years old should not apply insect repellent themselves. Do not apply to young children's hands or around their  eyes and mouth.  If you are visiting areas where malaria occurs, read the malaria prevention recommendations on the CDC malaria website ([employer].dk). Your caregiver will guide you on the selection and use of an anti-malaria preventive medicine that you may need to take before, during, and after your visit.  To prevent fungal and parasitic infections, keep feet clean and dry. Do not go barefoot.  Always use condoms to reduce the risk of HIV and other sexually transmitted diseases.  Try to travel in vehicles that have seat belts, whenever possible. If renting a vehicle, try to rent a larger one for added protection. Wear helmets whenever bicycling or motorcycling. Avoid alcohol when operating any vehicle, even a bicycle. Avoid overcrowded, over-weighted, or top heavy buses or mini-vans. Be aware that pedestrian  patterns vary greatly by country. TO AVOID GETTING SICK:  Do not eat food purchased from street vendors.  Eat at restaurants that often cater to foreign travelers (leading hotels, hotel chains).  Do not drink beverages with ice.  Do not eat dairy products, unless you know they have been pasteurized.  Do not share needles with anyone.  Do not handle animals (especially monkeys, dogs, and cats). Avoid bites and serious diseases (including rabies and plague).  Do not swim in fresh water. Salt water is often safer. Avoid swimming pools that are not chlorinated.  Do not have unprotected sex. WHAT YOU NEED TO BRING WITH YOU:  Long-sleeved shirt, long pants, and a hat to wear outside. This is to prevent illnesses carried by insects (malaria, dengue, filariasis, leishmaniasis, onchocerciasis).  Contact information card, for use in urgent situations. This should list the names, addresses, and telephone numbers of family member(s) or contact(s) in your country, your primary caregivers, important specialty home caregivers, area hospitals and clinics where you will travel, and your national consulate or embassy.  Purchase a pre-packaged travel health kit from a reputable source, or create one yourself. This should include a first aid kit and commonly needed medicines. ITEMS TO INCLUDE IN A TRAVEL HEALTH KIT:  Insect repellent containing DEET.  Bed nets impregnated with permethrin. (Can be purchased in camping or [employer] supply stores. Overseas, permethrin or another insecticide, deltamethrin, may be purchased to treat bed nets and clothes.)  Flying-insect spray or mosquito coils, to help clear rooms of mosquitoes. The product should contain a pyrethroid insecticide. These insecticides quickly kill flying insects, including mosquitoes.  Over-the-counter anti-diarrhea medicine, to take if you have diarrhea.  Iodine tablets and water filters to purify water, if bottled water is not  available.  Sunblock and sunglasses.  Antibacterial hand wipes or an alcohol-based hand sanitizer. Must contain at least 60% alcohol.  Extra pair of contacts or prescription glasses, or both, for people who wear corrective lenses.  Prescription medicines. Make sure you have enough to last during your trip, as well as a copy of the prescription(s).  Destination-related medicines, if applicable:  Anti-malaria medicines.  Medicine to prevent or treat high-altitude illness.  Pain or fever medicines (acetaminophen, aspirin, ibuprofen).  Stomach upset or diarrhea medicines:  Over-the-counter anti-diarrhea medicine (loperamide, bismuth subsalicylate).  Antibiotic for self-treatment of moderate to severe diarrhea.  Oral rehydration solution packets.  Mild laxative.  Antacid.  Items to treat throat and respiratory symptoms:  Antihistamine.  Decongestant, alone or combined with antihistamine.  Cough suppressant or expectorant (promotes the expulsion of mucus).  Throat lozenges.  Anti-motion sickness medicine.  Epinephrine auto-injector (such as an EpiPen), if you have a history of severe allergic reaction.  Smaller dose packages are available for children.  Any medicines, prescription or over-the-counter, taken on a regular basis at home. For Basic First Aid  Disposable gloves (at least two pairs).  Adhesive bandages, multiple sizes.  Gauze.  Adhesive tape.  Elastic bandage wrap for sprains and strains.  Antiseptic.  Cotton swabs.  Tweezers.  Scissors.  Antifungal and antibacterial ointments or creams.  1% hydrocortisone cream.  Anti-itch gel or cream, for insect bites and stings.  Aloe gel for sunburns.  Moleskin or molefoam for blisters.  Digital thermometer.  Saline eye drops.  First-aid quick reference card.  Commercial suture and syringe kits, to be used by a local caregiver. (These items will also require a letter from the prescribing  physician, on official letterhead stationery.) Note that some of the above items are considered sharp. They will need to be packed in your checked luggage, not in a carry on.  AFTER YOU RETURN HOME: If you have visited a malaria risk area, continue taking your anti-malaria drug for 4 weeks (chloroquine, doxycycline, or mefloquine) or 7 days (atovaquone/proguanil) after leaving the risk area. Malaria is always a serious disease and may be a deadly illness. If you become ill with a fever or flu-like illness, while traveling or after you return home (for up to 1 year), you should seek immediate medical attention. Tell the caregiver your travel history. This information is courtesy of the Center for Disease Control (CDC).  Document Released: 06/09/2004 Document Revised: 09/20/2011 Document Reviewed: 04/28/2009 [facility] Patient Information 2014 [place].

## 2013-11-26 ENCOUNTER — Encounter: Payer: Self-pay | Admitting: *Deleted

## 2013-12-17 ENCOUNTER — Encounter (HOSPITAL_COMMUNITY): Payer: Self-pay | Admitting: Emergency Medicine

## 2013-12-17 ENCOUNTER — Emergency Department (HOSPITAL_COMMUNITY)
Admission: EM | Admit: 2013-12-17 | Discharge: 2013-12-17 | Disposition: A | Discharge status: Home / Self Care | Payer: No Typology Code available for payment source | Attending: Emergency Medicine | Admitting: Emergency Medicine

## 2013-12-17 DIAGNOSIS — Z87448 Personal history of other diseases of urinary system: Secondary | ICD-10-CM | POA: Insufficient documentation

## 2013-12-17 DIAGNOSIS — G43909 Migraine, unspecified, not intractable, without status migrainosus: Secondary | ICD-10-CM | POA: Insufficient documentation

## 2013-12-17 DIAGNOSIS — Z8742 Personal history of other diseases of the female genital tract: Secondary | ICD-10-CM | POA: Insufficient documentation

## 2013-12-17 DIAGNOSIS — Z87440 Personal history of urinary (tract) infections: Secondary | ICD-10-CM | POA: Insufficient documentation

## 2013-12-17 MED ORDER — DIPHENHYDRAMINE HCL 50 MG/ML IJ SOLN
25.0000 mg | Freq: Once | INTRAMUSCULAR | Status: AC
  Administered 2013-12-17: 25 mg via INTRAVENOUS
  Filled 2013-12-17: qty 1

## 2013-12-17 MED ORDER — SODIUM CHLORIDE 0.9 % IV BOLUS (SEPSIS)
1000.0000 mL | Freq: Once | INTRAVENOUS | Status: AC
  Administered 2013-12-17: 1000 mL via INTRAVENOUS

## 2013-12-17 MED ORDER — METOCLOPRAMIDE HCL 5 MG/ML IJ SOLN
10.0000 mg | Freq: Once | INTRAMUSCULAR | Status: AC
  Administered 2013-12-17: 10 mg via INTRAVENOUS
  Filled 2013-12-17: qty 2

## 2013-12-17 MED ORDER — DEXAMETHASONE SODIUM PHOSPHATE 10 MG/ML IJ SOLN
10.0000 mg | Freq: Once | INTRAMUSCULAR | Status: AC
  Administered 2013-12-17: 10 mg via INTRAVENOUS
  Filled 2013-12-17: qty 1

## 2013-12-17 NOTE — ED Provider Notes (Signed)
CSN: [id number]     Arrival date & time 12/17/13  [id number] History   First MD Initiated Contact with Patient 12/17/13 0319     Chief Complaint  Patient presents with  . Migraine    (Consider location/radiation/quality/duration/timing/severity/associated sxs/prior Treatment) HPI Comments: 28 year old female with a history of migraine headaches presents to the emergency department for a migraine. She states that migraine began early this morning and has been constant since this time. Pain currently rated 10/10. Pain is sharp in nature and present to patient's left frontal region and left temporal region. Symptoms preceded by an aura characterized as vision changes in her L eye; vision changes have resolved and aura c/w prior migraine headaches. Symptoms also associated with nausea. Patient states she has been getting worsening migraines since starting Depo-Provera. She denies fever, neck stiffness, syncope, hearing changes, difficulty speaking or swallowing, vomiting, numbness/tingling, and extremity weakness.  Patient is a 28 y.o. female presenting with migraines. The history is provided by the patient. No language interpreter was used.  Migraine Associated symptoms include headaches and nausea. Pertinent negatives include no fever, vomiting or weakness.    Past Medical History  Diagnosis Date  . Migraine   . Endometriosis   . UTI (lower urinary tract infection)   . Bladder infection    Past Surgical History  Procedure Laterality Date  . Umbilical hernia repair     Family History  Problem Relation Age of Onset  . Hypertension Maternal Grandmother   . Stroke Maternal Grandmother   . Cancer Maternal Grandmother   . Cancer Paternal Grandmother    History  Substance Use Topics  . Smoking status: Never Smoker   . Smokeless tobacco: Not on file  . Alcohol Use: No   OB History   Grav Para Term Preterm Abortions TAB SAB Ect Mult Living                  Review of Systems   Constitutional: Negative for fever.  HENT: Negative for hearing loss, tinnitus and trouble swallowing.   Eyes: Positive for visual disturbance (resolved).  Gastrointestinal: Positive for nausea. Negative for vomiting.  Musculoskeletal: Negative for neck stiffness.  Neurological: Positive for headaches. Negative for syncope, speech difficulty and weakness.  All other systems reviewed and are negative.    Allergies  Toradol and Vicodin  Home Medications   Prior to Admission medications   Medication Sig Start Date End Date Taking? Authorizing Provider  medroxyPROGESTERone (DEPO-PROVERA) 150 MG/ML injection Inject 150 mg into the muscle every 3 (three) months.   Yes Historical Provider, MD  pseudoephedrine-acetaminophen (TYLENOL SINUS) 30-500 MG TABS Take 2 tablets by mouth every 4 (four) hours as needed (pain).   Yes Historical Provider, MD  Vitamin D, Ergocalciferol, (DRISDOL) 50000 UNITS CAPS capsule Take 50,000 Units by mouth every 7 (seven) days. Takes on Fridays 10/11/13  Yes Deepak Advani, MD   BP 112/66  Pulse 79  Temp(Src) 97.9 F (36.6 C) (Oral)  Resp 18  SpO2 100%  Physical Exam  Nursing note and vitals reviewed. Constitutional: She is oriented to person, place, and time. She appears well-developed and well-nourished. No distress.  Nontoxic/nonseptic appearing  HENT:  Head: Normocephalic and atraumatic.  Mouth/Throat: Oropharynx is clear and moist. No oropharyngeal exudate.  Eyes: Conjunctivae and EOM are normal. Pupils are equal, round, and reactive to light. No scleral icterus.  Pupils equal round and reactive to direct and consensual light  Neck: Normal range of motion.  No nuchal rigidity or meningismus  Cardiovascular: Normal rate, regular rhythm and intact distal pulses.   Pulses:      Radial pulses are 2+ on the right side, and 2+ on the left side.  Pulmonary/Chest: Effort normal. No respiratory distress. She has no wheezes.  Musculoskeletal: Normal range of  motion.  Neurological: She is alert and oriented to person, place, and time. No cranial nerve deficit. She exhibits normal muscle tone. Coordination normal.  GCS 15. No cranial nerve deficits appreciated; eyebrow raise symmetric, no facial drooping. Patient moves extremities without ataxia. Normal grips with 5/5 strength against resistance. No gross sensory deficits noted. Patient speaking in full goal oriented sentences.  Skin: Skin is warm and dry. No rash noted. She is not diaphoretic. No erythema. No pallor.  Psychiatric: She has a normal mood and affect. Her behavior is normal.    ED Course  Procedures (including critical care time) Labs Review Labs Reviewed - No data to display  Imaging Review No results found.   EKG Interpretation None      MDM   Final diagnoses:  Migraine    28 year old female with a history of migraine headaches presents for migraine headache. Symptoms c/w prior headaches. No thunderclap onset. Neurologic exam nonfocal. No fevers, nuchal rigidity, or meningismus. Patient tx with IVF, Decadron, Reglan, and Benadryl. Symptoms improved from 10/10 to 0/10 with this treatment.  Doubt emergent cause of migraine today given reassuring exam. Symptoms also c/w prior headaches. Patient able to manage symptoms further at home. Will d/c with necessary return precautions. Patient agreeable to plan with no unaddressed concerns.   Filed Vitals:   12/17/13 0317 12/17/13 0318 12/17/13 0320 12/17/13 0648  BP: 124/72  112/66 102/54  Pulse: 66  79 85  Temp:   97.9 F (36.6 C)   TempSrc:   Oral   Resp: 18  18 16   SpO2: 100% 100% 100% 99%     Antony Madura, PA-C 12/17/13 [phone]

## 2013-12-17 NOTE — ED Notes (Signed)
Pt arrived to the ED with a complaint of a migraine headache.  Pt states migraine has been present for 30 minutes.  Pt has a history of migraines since she has been on birth control.

## 2013-12-17 NOTE — Discharge Instructions (Signed)
Migraine Headache A migraine headache is an intense, throbbing pain on one or both sides of your head. A migraine can last for 30 minutes to several hours. CAUSES  The exact cause of a migraine headache is not always known. However, a migraine may be caused when nerves in the brain become irritated and release chemicals that cause inflammation. This causes pain. Certain things may also trigger migraines, such as:  Alcohol.  Smoking.  Stress.  Menstruation.  Aged cheeses.  Foods or drinks that contain nitrates, glutamate, aspartame, or tyramine.  Lack of sleep.  Chocolate.  Caffeine.  Hunger.  Physical exertion.  Fatigue.  Medicines used to treat chest pain (nitroglycerine), birth control pills, estrogen, and some blood pressure medicines. SIGNS AND SYMPTOMS  Pain on one or both sides of your head.  Pulsating or throbbing pain.  Severe pain that prevents daily activities.  Pain that is aggravated by any physical activity.  Nausea, vomiting, or both.  Dizziness.  Pain with exposure to bright lights, loud noises, or activity.  General sensitivity to bright lights, loud noises, or smells. Before you get a migraine, you may get warning signs that a migraine is coming (aura). An aura may include:  Seeing flashing lights.  Seeing bright spots, halos, or zig-zag lines.  Having tunnel vision or blurred vision.  Having feelings of numbness or tingling.  Having trouble talking.  Having muscle weakness. DIAGNOSIS  A migraine headache is often diagnosed based on:  Symptoms.  Physical exam.  A CT scan or MRI of your head. These imaging tests cannot diagnose migraines, but they can help rule out other causes of headaches. TREATMENT Medicines may be given for pain and nausea. Medicines can also be given to help prevent recurrent migraines.  HOME CARE INSTRUCTIONS  Only take over-the-counter or prescription medicines for pain or discomfort as directed by your  health care provider. The use of long-term narcotics is not recommended.  Lie down in a dark, quiet room when you have a migraine.  Keep a journal to find out what may trigger your migraine headaches. For example, write down:  What you eat and drink.  How much sleep you get.  Any change to your diet or medicines.  Limit alcohol consumption.  Quit smoking if you smoke.  Get 7 9 hours of sleep, or as recommended by your health care provider.  Limit stress.  Keep lights dim if bright lights bother you and make your migraines worse. SEEK IMMEDIATE MEDICAL CARE IF:   Your migraine becomes severe.  You have a fever.  You have a stiff neck.  You have vision loss.  You have muscular weakness or loss of muscle control.  You start losing your balance or have trouble walking.  You feel faint or pass out.  You have severe symptoms that are different from your first symptoms. MAKE SURE YOU:   Understand these instructions.  Will watch your condition.  Will get help right away if you are not doing well or get worse. Document Released: 06/28/2005 Document Revised: 04/18/2013 Document Reviewed: 03/05/2013 [employer] Patient Information 2014 [employer].  

## 2013-12-17 NOTE — ED Notes (Signed)
Bed: UE28 Expected date:  Expected time:  Means of arrival:  Comments: EMS 28yo F; migraine x 30 min

## 2013-12-26 NOTE — ED Provider Notes (Signed)
Medical screening examination/treatment/procedure(s) were performed by non-physician practitioner and as supervising physician I was immediately available for consultation/collaboration.     Brandt LoosenJulie Tiaria Biby, MD 12/26/13 [phone]

## 2014-01-07 ENCOUNTER — Encounter: Payer: No Typology Code available for payment source | Admitting: Obstetrics and Gynecology

## 2014-01-08 ENCOUNTER — Ambulatory Visit: Payer: No Typology Code available for payment source | Admitting: Internal Medicine

## 2014-01-09 ENCOUNTER — Telehealth: Payer: Self-pay | Admitting: *Deleted

## 2014-01-09 NOTE — Telephone Encounter (Signed)
Patient calling because she works in a [employer]ation [employer]house without AC. Patient was seen in ED on 12/17/2013 for migraines d/t dehydration. 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

## 2014-01-15 ENCOUNTER — Ambulatory Visit: Payer: No Typology Code available for payment source | Attending: Internal Medicine | Admitting: *Deleted

## 2014-01-15 VITALS — BP 104/73 | HR 89 | Temp 98.3°F | Resp 14 | Ht 66.0 in | Wt 123.5 lb

## 2014-01-15 DIAGNOSIS — J3089 Other allergic rhinitis: Secondary | ICD-10-CM

## 2014-01-15 DIAGNOSIS — Z973 Presence of spectacles and contact lenses: Secondary | ICD-10-CM

## 2014-01-15 MED ORDER — CETIRIZINE HCL 10 MG PO TABS: 10.0000 mg | ORAL_TABLET | Freq: Every day | ORAL | Status: DC

## 2014-01-15 NOTE — Progress Notes (Unsigned)
Patient presents today with c/o itchy eyes and discharge. Patient states the discharge is it clear to white and more prominent in the morning when she wakes up. Patient does wear contacts and has had to wear her glasses for one week. 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 Consulted with Dr. Orpah CobbAdvani. Verbal order given for opthalmology referral and Zyrtec 10 mg po daily.  Annamaria Hellingose,Naphtali Riede Renee, RN

## 2014-01-15 NOTE — Patient Instructions (Signed)
Start taking your Zyrtec. If symptoms worsen or persist return to the office.

## 2014-02-15 ENCOUNTER — Telehealth: Payer: Self-pay

## 2014-02-15 NOTE — Telephone Encounter (Signed)
Patient called stating she has been seen in our office before and was given a RX for depo 150mg  that had been sent to her pharmacy and she was taking them to another office to give them to her as she is gone away for 4 months. Patient states she will have one 04/17/14 but would like to know when she would be due for her next dose. States it is OK to leave message on voicemail with dates if she does not pick up.  Per depo provera 150mg  chart patient would be due for next injection between 07/03/14-07/17/14.  Called patient and left message informing her of next injection dates and to call clinic with any further questions.

## 2014-07-23 ENCOUNTER — Ambulatory Visit: Payer: Self-pay | Admitting: Internal Medicine

## 2014-08-09 ENCOUNTER — Encounter: Payer: Self-pay | Admitting: Internal Medicine

## 2014-08-09 ENCOUNTER — Ambulatory Visit: Payer: Self-pay | Attending: Internal Medicine | Admitting: Internal Medicine

## 2014-08-09 VITALS — BP 116/79 | HR 90 | Temp 98.7°F | Resp 16 | Wt 127.2 lb

## 2014-08-09 DIAGNOSIS — K029 Dental caries, unspecified: Secondary | ICD-10-CM | POA: Insufficient documentation

## 2014-08-09 DIAGNOSIS — J0100 Acute maxillary sinusitis, unspecified: Secondary | ICD-10-CM | POA: Insufficient documentation

## 2014-08-09 DIAGNOSIS — R490 Dysphonia: Secondary | ICD-10-CM | POA: Insufficient documentation

## 2014-08-09 DIAGNOSIS — R0981 Nasal congestion: Secondary | ICD-10-CM | POA: Insufficient documentation

## 2014-08-09 MED ORDER — AZITHROMYCIN 250 MG PO TABS: ORAL_TABLET | ORAL | Status: DC

## 2014-08-09 MED ORDER — FLUTICASONE PROPIONATE 50 MCG/ACT NA SUSP: 2.0000 | Freq: Every day | NASAL | Status: DC

## 2014-08-09 NOTE — Progress Notes (Signed)
Patient complains of having some sinus pressure and headaches on and off Has been using otc medications with no relief Patient is requesting a referral to ent and dentist

## 2014-08-09 NOTE — Progress Notes (Signed)
MRN: [id number] Name: Jane [name]  Sex: female Age: 29 y.o. DOB: [birth year]  Allergies: Toradol and Vicodin  Chief Complaint  Patient presents with  . Sinusitis    HPI: Patient is 29 y.o. 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  Past Medical History  Diagnosis Date  . Migraine   . Endometriosis   . UTI (lower urinary tract infection)   . Bladder infection     Past Surgical History  Procedure Laterality Date  . Umbilical hernia repair        Medication List       This list is accurate as of: 08/09/14 10:12 AM.  Always use your most recent med list.               azithromycin 250 MG tablet  Commonly known as:  ZITHROMAX Z-PAK  Take as directed     cetirizine 10 MG tablet  Commonly known as:  ZYRTEC  Take 1 tablet (10 mg total) by mouth daily.     fluticasone 50 MCG/ACT nasal spray  Commonly known as:  FLONASE  Place 2 sprays into both nostrils daily.     medroxyPROGESTERone 150 MG/ML injection  Commonly known as:  DEPO-PROVERA  Inject 150 mg into the muscle every 3 (three) months.     pseudoephedrine-acetaminophen 30-500 MG Tabs  Commonly known as:  TYLENOL SINUS  Take 2 tablets by mouth every 4 (four) hours as needed (pain).     Vitamin D (Ergocalciferol) 50000 UNITS Caps capsule  Commonly known as:  DRISDOL  Take 50,000 Units by mouth every 7 (seven) days. Takes on Fridays        Meds ordered this encounter  Medications  . fluticasone (FLONASE) 50 MCG/ACT nasal spray    Sig: Place 2 sprays into both nostrils  daily.    Dispense:  16 g    Refill:  2  . azithromycin (ZITHROMAX Z-PAK) 250 MG tablet    Sig: Take as directed    Dispense:  6 each    Refill:  0     There is no immunization history on file for this patient.  Family History  Problem Relation Age of Onset  . Hypertension Maternal Grandmother   . Stroke Maternal Grandmother   . Cancer Maternal Grandmother   . Cancer Paternal Grandmother     History  Substance Use Topics  . Smoking status: Never Smoker   . Smokeless tobacco: Not on file  . Alcohol Use: No    Review of Systems   As noted in HPI  Filed Vitals:   08/09/14 0944  BP: 116/79  Pulse: 90  Temp: 98.7 F (37.1 C)  Resp: 16    Physical Exam  Physical Exam  Constitutional: No distress.  HENT:  Nasal congestion minimal sinus tenderness   Eyes: EOM are normal. Pupils are equal, round, and reactive to light.  Cardiovascular: Normal rate and regular rhythm.   Pulmonary/Chest: Breath sounds normal. No stridor. No respiratory distress. She has no wheezes. She has no rales.    CBC  Component Value Date/Time   WBC 4.6 10/09/2013 1039   RBC 4.51 10/09/2013 1039   HGB 13.4 10/09/2013 1039   HCT 40.1 10/09/2013 1039   PLT 216 10/09/2013 1039   MCV 88.9 10/09/2013 1039   LYMPHSABS 1.9 10/09/2013 1039   MONOABS 0.3 10/09/2013 1039   EOSABS 0.0 10/09/2013 1039   BASOSABS 0.0 10/09/2013 1039    CMP     Component Value Date/Time   NA 140 10/09/2013 1039   K 4.5 10/09/2013 1039   CL 106 10/09/2013 1039   CO2 24 10/09/2013 1039   GLUCOSE 80 10/09/2013 1039   BUN 10 10/09/2013 1039   CREATININE 0.80 10/09/2013 1039   CREATININE 0.9 02/26/2009 1350   CALCIUM 9.2 10/09/2013 1039   PROT 7.0 10/09/2013 1039   ALBUMIN 4.3 10/09/2013 1039   AST 13 10/09/2013 1039   ALT 11 10/09/2013 1039   ALKPHOS 55 10/09/2013 1039   BILITOT 0.6 10/09/2013 1039   GFRNONAA >89 10/09/2013 1039   GFRNONAA >60 02/26/2009 1333   GFRAA >89 10/09/2013 1039   GFRAA   02/26/2009 1333    >60        The eGFR has been calculated using the MDRD equation. This calculation has not been validated in all clinical situations. eGFR's persistently <60 mL/min signify possible Chronic Kidney Disease.    No results found for: CHOL  No components found for: HGA1C  Lab Results  Component Value Date/Time   AST 13 10/09/2013 10:39 AM    Assessment and Plan  Acute maxillary sinusitis, recurrence not specified - Plan: azithromycin (ZITHROMAX Z-PAK) 250 MG tablet  Dental cavities - Plan: Ambulatory referral to Dentistry  Nasal congestion - Plan: fluticasone (FLONASE) 50 MCG/ACT nasal spray, advise patient for saltwater gargles.  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  Lorayne Marek, MD

## 2014-09-06 ENCOUNTER — Ambulatory Visit: Payer: Self-pay | Attending: Internal Medicine | Admitting: Internal Medicine

## 2014-09-06 ENCOUNTER — Encounter: Payer: Self-pay | Admitting: Internal Medicine

## 2014-09-06 VITALS — BP 110/73 | HR 94 | Temp 98.0°F | Resp 16 | Wt 130.6 lb

## 2014-09-06 DIAGNOSIS — Z79899 Other long term (current) drug therapy: Secondary | ICD-10-CM | POA: Insufficient documentation

## 2014-09-06 DIAGNOSIS — Z139 Encounter for screening, unspecified: Secondary | ICD-10-CM | POA: Insufficient documentation

## 2014-09-06 DIAGNOSIS — M25511 Pain in right shoulder: Secondary | ICD-10-CM | POA: Insufficient documentation

## 2014-09-06 DIAGNOSIS — E559 Vitamin D deficiency, unspecified: Secondary | ICD-10-CM | POA: Insufficient documentation

## 2014-09-06 NOTE — Progress Notes (Signed)
Patient complains of pain to her right shoulder Pain has gotten a little worse over the past couple of days Last x ray done in April was negative

## 2014-09-06 NOTE — Progress Notes (Signed)
MRN: [id number] Name: Jane [name]  Sex: female Age: 29 y.o. DOB: 1985/07/20  Allergies: Toradol and Vicodin  Chief Complaint  Patient presents with  . Shoulder Pain    HPI: Patient is 29 y.o. female who comes today complaining of right shoulder pain, in the past she had x-ray done which was negative, patient denies any recent fall or trauma denies any numbness weakness, she has not tried any over-the-counter medication, in the past she was low vitamin D as per patient she's not taking any vitamin D supplements currently.  Past Medical History  Diagnosis Date  . Migraine   . Endometriosis   . UTI (lower urinary tract infection)   . Bladder infection     Past Surgical History  Procedure Laterality Date  . Umbilical hernia repair        Medication List       This list is accurate as of: 09/06/14  5:36 PM.  Always use your most recent med list.               azithromycin 250 MG tablet  Commonly known as:  ZITHROMAX Z-PAK  Take as directed     cetirizine 10 MG tablet  Commonly known as:  ZYRTEC  Take 1 tablet (10 mg total) by mouth daily.     fluticasone 50 MCG/ACT nasal spray  Commonly known as:  FLONASE  Place 2 sprays into both nostrils daily.     medroxyPROGESTERone 150 MG/ML injection  Commonly known as:  DEPO-PROVERA  Inject 150 mg into the muscle every 3 (three) months.     pseudoephedrine-acetaminophen 30-500 MG Tabs  Commonly known as:  TYLENOL SINUS  Take 2 tablets by mouth every 4 (four) hours as needed (pain).     Vitamin D (Ergocalciferol) 50000 UNITS Caps capsule  Commonly known as:  DRISDOL  Take 50,000 Units by mouth every 7 (seven) days. Takes on Fridays        No orders of the defined types were placed in this encounter.     There is no immunization history on file for this patient.  Family History  Problem Relation Age of Onset  . Hypertension Maternal Grandmother   . Stroke Maternal Grandmother   . Cancer Maternal  Grandmother   . Cancer Paternal Grandmother     History  Substance Use Topics  . Smoking status: Never Smoker   . Smokeless tobacco: Not on file  . Alcohol Use: No    Review of Systems   As noted in HPI  Filed Vitals:   09/06/14 1657  BP: 110/73  Pulse: 94  Temp: 98 F (36.7 C)  Resp: 16    Physical Exam  Physical Exam  Constitutional: No distress.  Eyes: EOM are normal. Pupils are equal, round, and reactive to light.  Cardiovascular: Normal rate and regular rhythm.   Pulmonary/Chest: Breath sounds normal. No respiratory distress. She has no wheezes. She has no rales.  Musculoskeletal:  Right shoulder good range of motion, minimal tenderness anteriorly, 2+ radial pulse equal strength both extremities.    CBC    Component Value Date/Time   WBC 4.6 10/09/2013 1039   RBC 4.51 10/09/2013 1039   HGB 13.4 10/09/2013 1039   HCT 40.1 10/09/2013 1039   PLT 216 10/09/2013 1039   MCV 88.9 10/09/2013 1039   LYMPHSABS 1.9 10/09/2013 1039   MONOABS 0.3 10/09/2013 1039   EOSABS 0.0 10/09/2013 1039   BASOSABS 0.0 10/09/2013 1039    CMP  Component Value Date/Time   NA 140 10/09/2013 1039   K 4.5 10/09/2013 1039   CL 106 10/09/2013 1039   CO2 24 10/09/2013 1039   GLUCOSE 80 10/09/2013 1039   BUN 10 10/09/2013 1039   CREATININE 0.80 10/09/2013 1039   CREATININE 0.9 02/26/2009 1350   CALCIUM 9.2 10/09/2013 1039   PROT 7.0 10/09/2013 1039   ALBUMIN 4.3 10/09/2013 1039   AST 13 10/09/2013 1039   ALT 11 10/09/2013 1039   ALKPHOS 55 10/09/2013 1039   BILITOT 0.6 10/09/2013 1039   GFRNONAA >89 10/09/2013 1039   GFRNONAA >60 02/26/2009 1333   GFRAA >89 10/09/2013 1039   GFRAA  02/26/2009 1333    >60        The eGFR has been calculated using the MDRD equation. This calculation has not been validated in all clinical situations. eGFR's persistently <60 mL/min signify possible Chronic Kidney Disease.    No results found for: CHOL  No components found for:  HGA1C  Lab Results  Component Value Date/Time   AST 13 10/09/2013 10:39 AM    Assessment and Plan  Right shoulder pain , her x-ray has been negative in the past, no recent trauma, will check her vitamin D level also she'll try over-the-counter Tylenol/ibuprofen.  Vitamin D deficiency - Plan: Vit D  25 hydroxy (rtn osteoporosis monitoring)  Screening - Plan: COMPLETE METABOLIC PANEL WITH GFR   Health Maintenance  -Vaccinations:  Patient declines flu shot  Return in about 3 months (around 12/05/2014), or if symptoms worsen or fail to improve.   This note has been created with Surveyor, quantity. Any transcriptional errors are unintentional.    Lorayne Marek, MD

## 2014-09-07 LAB — COMPLETE METABOLIC PANEL WITH GFR
ALBUMIN: 3.8 g/dL (ref 3.5–5.2)
ALT: 11 U/L (ref 0–35)
AST: 13 U/L (ref 0–37)
Alkaline Phosphatase: 57 U/L (ref 39–117)
BUN: 9 mg/dL (ref 6–23)
CO2: 25 mEq/L (ref 19–32)
Calcium: 9.1 mg/dL (ref 8.4–10.5)
Chloride: 109 mEq/L (ref 96–112)
Creat: 0.73 mg/dL (ref 0.50–1.10)
GFR, Est African American: >89 mL/min
GFR, Est Non African American: >89 mL/min
GLUCOSE: 78 mg/dL (ref 70–99)
Potassium: 4.3 mEq/L (ref 3.5–5.3)
Sodium: 142 mEq/L (ref 135–145)
TOTAL PROTEIN: 6.8 g/dL (ref 6.0–8.3)
Total Bilirubin: 0.6 mg/dL (ref 0.2–1.2)

## 2014-09-07 LAB — VITAMIN D 25 HYDROXY (VIT D DEFICIENCY, FRACTURES): Vit D, 25-Hydroxy: 10 ng/mL — ABNORMAL LOW (ref 30–100)

## 2014-09-09 ENCOUNTER — Telehealth: Payer: Self-pay | Admitting: *Deleted

## 2014-09-09 ENCOUNTER — Other Ambulatory Visit: Payer: Self-pay | Admitting: Family Medicine

## 2014-09-09 DIAGNOSIS — E559 Vitamin D deficiency, unspecified: Secondary | ICD-10-CM | POA: Insufficient documentation

## 2014-09-09 MED ORDER — VITAMIN D (ERGOCALCIFEROL) 1.25 MG (50000 UNIT) PO CAPS: 50000.0000 [IU] | ORAL_CAPSULE | ORAL | Status: DC

## 2014-09-09 NOTE — Assessment & Plan Note (Signed)
A: vit D 10 P: Supplemental vit D

## 2014-09-09 NOTE — Telephone Encounter (Signed)
-----   Message from Lora PaulaJosalyn C Funches, MD sent at 09/09/2014  1:45 PM EST ----- Normal CMP Vit D def Sent in vit D

## 2014-09-09 NOTE — Telephone Encounter (Signed)
Pt aware of resutls

## 2014-09-12 ENCOUNTER — Ambulatory Visit: Payer: Self-pay

## 2014-10-07 ENCOUNTER — Ambulatory Visit: Payer: Self-pay

## 2014-11-04 ENCOUNTER — Ambulatory Visit: Payer: Self-pay

## 2014-11-05 ENCOUNTER — Ambulatory Visit: Payer: Self-pay

## 2014-12-02 ENCOUNTER — Ambulatory Visit: Payer: Self-pay

## 2014-12-27 ENCOUNTER — Ambulatory Visit: Payer: Self-pay

## 2015-01-27 ENCOUNTER — Ambulatory Visit: Payer: Self-pay

## 2015-02-28 ENCOUNTER — Ambulatory Visit: Payer: Self-pay

## 2015-03-06 ENCOUNTER — Ambulatory Visit: Payer: Self-pay | Attending: Family Medicine

## 2015-05-09 ENCOUNTER — Emergency Department (HOSPITAL_COMMUNITY)
Admission: EM | Admit: 2015-05-09 | Discharge: 2015-05-09 | Disposition: A | Discharge status: Home / Self Care | Payer: Self-pay | Attending: Emergency Medicine | Admitting: Emergency Medicine

## 2015-05-09 ENCOUNTER — Encounter (HOSPITAL_COMMUNITY): Payer: Self-pay | Admitting: Emergency Medicine

## 2015-05-09 DIAGNOSIS — Z87440 Personal history of urinary (tract) infections: Secondary | ICD-10-CM | POA: Insufficient documentation

## 2015-05-09 DIAGNOSIS — Z3202 Encounter for pregnancy test, result negative: Secondary | ICD-10-CM | POA: Insufficient documentation

## 2015-05-09 DIAGNOSIS — N809 Endometriosis, unspecified: Secondary | ICD-10-CM | POA: Insufficient documentation

## 2015-05-09 DIAGNOSIS — Z8679 Personal history of other diseases of the circulatory system: Secondary | ICD-10-CM | POA: Insufficient documentation

## 2015-05-09 DIAGNOSIS — Z79899 Other long term (current) drug therapy: Secondary | ICD-10-CM | POA: Insufficient documentation

## 2015-05-09 DIAGNOSIS — N3090 Cystitis, unspecified without hematuria: Secondary | ICD-10-CM | POA: Insufficient documentation

## 2015-05-09 LAB — URINALYSIS, ROUTINE W REFLEX MICROSCOPIC
Bilirubin Urine: NEGATIVE
Glucose, UA: NEGATIVE mg/dL
KETONES UR: NEGATIVE mg/dL
NITRITE: POSITIVE — AB
PROTEIN: NEGATIVE mg/dL
Specific Gravity, Urine: 1.015 (ref 1.005–1.030)
Urobilinogen, UA: 1 mg/dL (ref 0.0–1.0)
pH: 6.5 (ref 5.0–8.0)

## 2015-05-09 LAB — POC URINE PREG, ED: PREG TEST UR: NEGATIVE

## 2015-05-09 LAB — URINE MICROSCOPIC-ADD ON

## 2015-05-09 MED ORDER — CEPHALEXIN 500 MG PO CAPS: 500.0000 mg | ORAL_CAPSULE | Freq: Four times a day (QID) | ORAL | Status: DC

## 2015-05-09 NOTE — Discharge Instructions (Signed)

## 2015-05-09 NOTE — ED Provider Notes (Signed)
CSN: [id number]     Arrival date & time 05/09/15  1136 History   First MD Initiated Contact with Patient 05/09/15 1138     Chief Complaint  Patient presents with  . Urinary Frequency  . Urinary Retention     (Consider location/radiation/quality/duration/timing/severity/associated sxs/prior Treatment) Patient is a 29 y.o. female presenting with frequency. The history is provided by the patient.  Urinary Frequency This is a new problem. The current episode started yesterday. The problem occurs constantly. The problem has been unchanged. Pertinent negatives include no abdominal pain, chest pain, chills, fever, nausea, rash or vomiting. Nothing aggravates the symptoms. She has tried nothing for the symptoms.   [name] is a 29 yo F that is presenting today with urinary frequency and retention. Her symptoms started 1-2 days ago. She has a history of 2 lifetime urinary tract infections. She reports this feels similar to her previous infections. She has not recently taken any antibiotics. She was diagnosed with Trichomonas earlier this year but denies any current symptoms. She has been monogamous with one partner. She's been having normal bowel movements. She is on Depo-Provera and was last dose in September. She denies any nausea, vomiting, diarrhea, fever, chills, abdominal pain, back pain, or rashes.  Past Medical History  Diagnosis Date  . Migraine   . Endometriosis   . UTI (lower urinary tract infection)   . Bladder infection    Past Surgical History  Procedure Laterality Date  . Umbilical hernia repair     Family History  Problem Relation Age of Onset  . Hypertension Maternal Grandmother   . Stroke Maternal Grandmother   . Cancer Maternal Grandmother   . Cancer Paternal Grandmother    Social History  Substance Use Topics  . Smoking status: Never Smoker   . Smokeless tobacco: None  . Alcohol Use: No   OB History    No data available     Review of Systems   Constitutional: Negative for fever and chills.  Respiratory: Negative for shortness of breath.   Cardiovascular: Negative for chest pain.  Gastrointestinal: Negative for nausea, vomiting, abdominal pain, diarrhea and constipation.  Genitourinary: Positive for dysuria and frequency.  Skin: Negative for rash.  Neurological: Negative for light-headedness.  Psychiatric/Behavioral: Negative for behavioral problems.      Allergies  Toradol and Vicodin  Home Medications   Prior to Admission medications   Medication Sig Start Date End Date Taking? Authorizing Provider  b complex vitamins tablet Take 1 tablet by mouth 3 (three) times a week.   Yes Historical Provider, MD  CALCIUM-VITAMIN D PO Take 1 tablet by mouth daily.   Yes Historical Provider, MD  medroxyPROGESTERone (DEPO-PROVERA) 150 MG/ML injection Inject 150 mg into the muscle every 3 (three) months.   Yes Historical Provider, MD  pseudoephedrine-acetaminophen (TYLENOL SINUS) 30-500 MG TABS Take 2 tablets by mouth every 4 (four) hours as needed (pain).   Yes Historical Provider, MD  azithromycin (ZITHROMAX Z-PAK) 250 MG tablet Take as directed Patient not taking: Reported on 05/09/2015 08/09/14   Doris Cheadle, MD  cephALEXin (KEFLEX) 500 MG capsule Take 1 capsule (500 mg total) by mouth 4 (four) times daily. For total of 5 days. 05/09/15   Myra Rude, MD  cetirizine (ZYRTEC) 10 MG tablet Take 1 tablet (10 mg total) by mouth daily. Patient not taking: Reported on 05/09/2015 01/15/14   Doris Cheadle, MD  fluticasone (FLONASE) 50 MCG/ACT nasal spray Place 2 sprays into both nostrils daily. Patient not taking: Reported  on 05/09/2015 08/09/14   Doris Cheadleeepak Advani, MD  Vitamin D, Ergocalciferol, (DRISDOL) 50000 UNITS CAPS capsule Take 1 capsule (50,000 Units total) by mouth every 7 (seven) days. For 8 weeks Patient not taking: Reported on 05/09/2015 09/09/14   Josalyn Funches, MD   BP 106/69 mmHg  Pulse 66  Temp(Src) 98 F (36.7 C)  (Oral)  Resp 16  SpO2 100% Physical Exam  Constitutional: She is oriented to person, place, and time. She appears well-developed and well-nourished.  HENT:  Head: Normocephalic and atraumatic.  Eyes: Conjunctivae and EOM are normal.  Neck: Normal range of motion. Neck supple.  Cardiovascular: Normal rate, regular rhythm, normal heart sounds and intact distal pulses.   No murmur heard. Pulmonary/Chest: Effort normal and breath sounds normal. No respiratory distress. She has no wheezes.  Abdominal: Soft. Bowel sounds are normal. She exhibits no distension. There is no tenderness.  Musculoskeletal: Normal range of motion. She exhibits no edema.  Neurological: She is alert and oriented to person, place, and time.  Skin: Skin is warm. No rash noted.  Psychiatric: She has a normal mood and affect.    ED Course  Procedures (including critical care time) Labs Review Labs Reviewed  URINALYSIS, ROUTINE W REFLEX MICROSCOPIC (NOT AT [facility]) - Abnormal; Notable for the following:    Color, Urine ORANGE (*)    APPearance CLOUDY (*)    Hgb urine dipstick MODERATE (*)    Nitrite POSITIVE (*)    Leukocytes, UA MODERATE (*)    All other components within normal limits  URINE MICROSCOPIC-ADD ON - Abnormal; Notable for the following:    Casts HYALINE CASTS (*)    All other components within normal limits  URINE CULTURE  POC URINE PREG, ED    Imaging Review No results found. I have personally reviewed and evaluated these images and lab results as part of my medical decision-making.   EKG Interpretation None       Medications - No data to display  MDM   Final diagnoses:  Cystitis   Ms. Jane FearFerguson is a 29 yo that is presenting with urinary frequency and retention. Feels similar to her previous UTI's. UA with nitrites and leukocytes. Previous urine culture has grown e coli and pan sensitive. Treat with keflex for 5 days. Patient stable for discharge.   Myra RudeJeremy E Schmitz, MD PGY-3, [facility] 05/09/2015, 1:49 PM      Myra RudeJeremy E Schmitz, MD 05/09/15 1349  Nelva Nayobert Beaton, MD 05/16/15 2209

## 2015-05-09 NOTE — ED Notes (Signed)
Pt states over the last couple days has felt lower abdominal soreness, urinary frequency, urinary retention, and occasional dysuria. States this is how she felt the last time she got a UTI. Denies N/V/D, fever/chills, significant abdominal pain.

## 2015-05-09 NOTE — Progress Notes (Signed)
[facility] but [facility] pcp no longer present and was told "call back on Monday to get a new doctor" CM [facility], CM attempted to get a new pcp but unsuccessful Information entered in pt d/c instructions and CM updated pt verbally

## 2015-05-11 LAB — URINE CULTURE

## 2015-06-03 ENCOUNTER — Emergency Department (HOSPITAL_COMMUNITY): Payer: Self-pay

## 2015-06-03 ENCOUNTER — Encounter (HOSPITAL_COMMUNITY): Payer: Self-pay | Admitting: Emergency Medicine

## 2015-06-03 ENCOUNTER — Emergency Department (HOSPITAL_COMMUNITY)
Admission: EM | Admit: 2015-06-03 | Discharge: 2015-06-03 | Disposition: A | Discharge status: Home / Self Care | Payer: Self-pay | Attending: Physician Assistant | Admitting: Physician Assistant

## 2015-06-03 DIAGNOSIS — R102 Pelvic and perineal pain: Secondary | ICD-10-CM | POA: Insufficient documentation

## 2015-06-03 DIAGNOSIS — Z3202 Encounter for pregnancy test, result negative: Secondary | ICD-10-CM | POA: Insufficient documentation

## 2015-06-03 DIAGNOSIS — Z87440 Personal history of urinary (tract) infections: Secondary | ICD-10-CM | POA: Insufficient documentation

## 2015-06-03 DIAGNOSIS — R110 Nausea: Secondary | ICD-10-CM | POA: Insufficient documentation

## 2015-06-03 DIAGNOSIS — R1031 Right lower quadrant pain: Secondary | ICD-10-CM | POA: Insufficient documentation

## 2015-06-03 DIAGNOSIS — N939 Abnormal uterine and vaginal bleeding, unspecified: Secondary | ICD-10-CM | POA: Insufficient documentation

## 2015-06-03 DIAGNOSIS — R1032 Left lower quadrant pain: Secondary | ICD-10-CM | POA: Insufficient documentation

## 2015-06-03 DIAGNOSIS — Z79899 Other long term (current) drug therapy: Secondary | ICD-10-CM | POA: Insufficient documentation

## 2015-06-03 DIAGNOSIS — R1030 Lower abdominal pain, unspecified: Secondary | ICD-10-CM

## 2015-06-03 DIAGNOSIS — Z8669 Personal history of other diseases of the nervous system and sense organs: Secondary | ICD-10-CM | POA: Insufficient documentation

## 2015-06-03 DIAGNOSIS — Z793 Long term (current) use of hormonal contraceptives: Secondary | ICD-10-CM | POA: Insufficient documentation

## 2015-06-03 LAB — CBC WITH DIFFERENTIAL/PLATELET
BASOS PCT: 0 %
Basophils Absolute: 0 10*3/uL (ref 0.0–0.1)
EOS ABS: 0.1 10*3/uL (ref 0.0–0.7)
Eosinophils Relative: 1 %
HCT: 38.8 % (ref 36.0–46.0)
HEMOGLOBIN: 12.8 g/dL (ref 12.0–15.0)
Lymphocytes Relative: 47 %
Lymphs Abs: 2 10*3/uL (ref 0.7–4.0)
MCH: 30.4 pg (ref 26.0–34.0)
MCHC: 33 g/dL (ref 30.0–36.0)
MCV: 92.2 fL (ref 78.0–100.0)
Monocytes Absolute: 0.2 10*3/uL (ref 0.1–1.0)
Monocytes Relative: 6 %
NEUTROS PCT: 46 %
Neutro Abs: 2 10*3/uL (ref 1.7–7.7)
PLATELETS: 172 10*3/uL (ref 150–400)
RBC: 4.21 MIL/uL (ref 3.87–5.11)
RDW: 12.6 % (ref 11.5–15.5)
WBC: 4.3 10*3/uL (ref 4.0–10.5)

## 2015-06-03 LAB — COMPREHENSIVE METABOLIC PANEL
ALBUMIN: 4.1 g/dL (ref 3.5–5.0)
ALT: 18 U/L (ref 14–54)
AST: 17 U/L (ref 15–41)
Alkaline Phosphatase: 54 U/L (ref 38–126)
Anion gap: 7 (ref 5–15)
BUN: 11 mg/dL (ref 6–20)
CHLORIDE: 107 mmol/L (ref 101–111)
CO2: 26 mmol/L (ref 22–32)
CREATININE: 0.93 mg/dL (ref 0.44–1.00)
Calcium: 9.6 mg/dL (ref 8.9–10.3)
GFR calc non Af Amer: >60 mL/min (ref >60)
GLUCOSE: 98 mg/dL (ref 65–99)
Potassium: 4.3 mmol/L (ref 3.5–5.1)
SODIUM: 140 mmol/L (ref 135–145)
Total Bilirubin: 0.6 mg/dL (ref 0.3–1.2)
Total Protein: 7.3 g/dL (ref 6.5–8.1)

## 2015-06-03 LAB — I-STAT BETA HCG BLOOD, ED (MC, WL, AP ONLY)

## 2015-06-03 LAB — URINE MICROSCOPIC-ADD ON: WBC, UA: NONE SEEN WBC/hpf (ref 0–5)

## 2015-06-03 LAB — URINALYSIS, ROUTINE W REFLEX MICROSCOPIC
BILIRUBIN URINE: NEGATIVE
Glucose, UA: NEGATIVE mg/dL
KETONES UR: NEGATIVE mg/dL
Leukocytes, UA: NEGATIVE
NITRITE: NEGATIVE
Protein, ur: NEGATIVE mg/dL
Specific Gravity, Urine: 1.022 (ref 1.005–1.030)
pH: 6.5 (ref 5.0–8.0)

## 2015-06-03 LAB — WET PREP, GENITAL
Clue Cells Wet Prep HPF POC: NONE SEEN
SPERM: NONE SEEN
TRICH WET PREP: NONE SEEN
YEAST WET PREP: NONE SEEN

## 2015-06-03 LAB — LIPASE, BLOOD: Lipase: 40 U/L (ref 11–51)

## 2015-06-03 MED ORDER — ONDANSETRON 4 MG PO TBDP: 4.0000 mg | ORAL_TABLET | Freq: Three times a day (TID) | ORAL | Status: DC | PRN

## 2015-06-03 MED ORDER — ONDANSETRON HCL 4 MG/2ML IJ SOLN
4.0000 mg | Freq: Once | INTRAMUSCULAR | Status: AC
  Administered 2015-06-03: 4 mg via INTRAVENOUS
  Filled 2015-06-03: qty 2

## 2015-06-03 MED ORDER — TRAMADOL HCL 50 MG PO TABS: 50.0000 mg | ORAL_TABLET | Freq: Four times a day (QID) | ORAL | Status: DC | PRN

## 2015-06-03 MED ORDER — MORPHINE SULFATE (PF) 4 MG/ML IV SOLN
4.0000 mg | Freq: Once | INTRAVENOUS | Status: AC
  Administered 2015-06-03: 4 mg via INTRAVENOUS
  Filled 2015-06-03: qty 1

## 2015-06-03 NOTE — ED Notes (Signed)
Per EMS pt started menstrual cycle x2 days but states she is on contraceptive via injection q 3 months; co lower abdominal and spotting; also co nausea after taking hydrocodone; pt received 4mg  zofran via IV en route

## 2015-06-03 NOTE — ED Notes (Signed)
Bed: WA07 Expected date:  Expected time:  Means of arrival:  Comments: Ems- 29 yo, lower abd pain, menstrual cycle

## 2015-06-03 NOTE — ED Provider Notes (Signed)
CSN: [id number]     Arrival date & time 06/03/15  0845 History   First MD Initiated Contact with Patient 06/03/15 [phone]     Chief Complaint  Patient presents with  . Abdominal Pain; Menstrual      (Consider location/radiation/quality/duration/timing/severity/associated sxs/prior Treatment) HPI   Patient is a 29 year old female with past medical history of endometriosis who presents the ED with complaint of lower abdominal pain, onset 2 days. Patient states having constant sharp lower abdominal pain with intermittent cramping. She reports pain is improved when laying supine and is worsened with sitting up. She notes she took an old prescription of Percocet yesterday with relief of pain and notes she took another dose this morning and reports onset of nausea. She notes she started having vaginal spotting yesterday. She sshe has been getting Depo shots every 3 months for the past 6 years and has not had a menstrual cycle since. Denies fever, chills, vomiting, diarrhea, urinary symptoms, blood in urine or stool, vaginal discharge.   Past Medical History  Diagnosis Date  . Migraine   . Endometriosis   . UTI (lower urinary tract infection)   . Bladder infection    Past Surgical History  Procedure Laterality Date  . Umbilical hernia repair     Family History  Problem Relation Age of Onset  . Hypertension Maternal Grandmother   . Stroke Maternal Grandmother   . Cancer Maternal Grandmother   . Cancer Paternal Grandmother    Social History  Substance Use Topics  . Smoking status: Never Smoker   . Smokeless tobacco: None  . Alcohol Use: No   OB History    No data available     Review of Systems  Gastrointestinal: Positive for nausea and abdominal pain.  Genitourinary: Positive for vaginal bleeding and pelvic pain.  All other systems reviewed and are negative.     Allergies  Toradol and Vicodin  Home Medications   Prior to Admission medications   Medication Sig Start Date  End Date Taking? Authorizing Provider  b complex vitamins tablet Take 1 tablet by mouth 3 (three) times a week.   Yes Historical Provider, MD  CALCIUM-VITAMIN D PO Take 1 tablet by mouth daily.   Yes Historical Provider, MD  medroxyPROGESTERone (DEPO-PROVERA) 150 MG/ML injection Inject 150 mg into the muscle every 3 (three) months.   Yes Historical Provider, MD  oxyCODONE-acetaminophen (PERCOCET/ROXICET) 5-325 MG tablet Take 1 tablet by mouth once.   Yes Historical Provider, MD  pseudoephedrine-acetaminophen (TYLENOL SINUS) 30-500 MG TABS Take 2 tablets by mouth every 4 (four) hours as needed (pain).   Yes Historical Provider, MD  cephALEXin (KEFLEX) 500 MG capsule Take 1 capsule (500 mg total) by mouth 4 (four) times daily. For total of 5 days. Patient not taking: Reported on 06/03/2015 05/09/15   Myra RudeJeremy E Schmitz, MD  fluticasone [facility](FLONASE) 50 MCG/ACT nasal spray Place 2 sprays into both nostrils daily. Patient not taking: Reported on 05/09/2015 08/09/14   Doris Cheadleeepak Advani, MD  ondansetron (ZOFRAN ODT) 4 MG disintegrating tablet Take 1 tablet (4 mg total) by mouth every 8 (eight) hours as needed for nausea or vomiting. 06/03/15   Barrett HenleNicole Elizabeth Reyden Smith, PA-C  traMADol (ULTRAM) 50 MG tablet Take 1 tablet (50 mg total) by mouth every 6 (six) hours as needed. 06/03/15   Barrett HenleNicole Elizabeth Nadiah Corbit, PA-C  Vitamin D, Ergocalciferol, (DRISDOL) 50000 UNITS CAPS capsule Take 1 capsule (50,000 Units total) by mouth every 7 (seven) days. For 8 weeks Patient not taking: Reported  on 05/09/2015 09/09/14   Josalyn Funches, MD   BP 117/81 mmHg  Pulse 71  Resp 16  SpO2 100% Physical Exam  Constitutional: She is oriented to person, place, and time. She appears well-developed and well-nourished.  HENT:  Head: Normocephalic and atraumatic.  Mouth/Throat: Oropharynx is clear and moist. No oropharyngeal exudate.  Eyes: Conjunctivae and EOM are normal. Right eye exhibits no discharge. Left eye exhibits no discharge. No  scleral icterus.  Neck: Normal range of motion. Neck supple.  Cardiovascular: Normal rate, regular rhythm, normal heart sounds and intact distal pulses.   Pulmonary/Chest: Effort normal and breath sounds normal. No respiratory distress. She has no wheezes. She has no rales. She exhibits no tenderness.  Abdominal: Soft. Bowel sounds are normal. She exhibits no distension and no mass. There is tenderness in the right lower quadrant, suprapubic area and left lower quadrant. There is no rigidity, no rebound, no guarding, no tenderness at McBurney's point and negative Murphy's sign.  Musculoskeletal: Normal range of motion. She exhibits no edema.  Lymphadenopathy:    She has no cervical adenopathy.  Neurological: She is alert and oriented to person, place, and time.  Skin: Skin is warm and dry.  Nursing note and vitals reviewed.   ED Course  Procedures (including critical care time) Labs Review Labs Reviewed  WET PREP, GENITAL - Abnormal; Notable for the following:    WBC, Wet Prep HPF POC MODERATE (*)    All other components within normal limits  URINALYSIS, ROUTINE W REFLEX MICROSCOPIC (NOT AT [facility]) - Abnormal; Notable for the following:    Hgb urine dipstick TRACE (*)    All other components within normal limits  URINE MICROSCOPIC-ADD ON - Abnormal; Notable for the following:    Squamous Epithelial / LPF 0-5 (*)    Bacteria, UA RARE (*)    All other components within normal limits  CBC WITH DIFFERENTIAL/PLATELET  COMPREHENSIVE METABOLIC PANEL  LIPASE, BLOOD  HIV ANTIBODY (ROUTINE TESTING)  I-STAT BETA HCG BLOOD, ED ([facility], [facility], [facility] ONLY)  GC/CHLAMYDIA PROBE AMP ([place]) NOT AT [facility]    Imaging Review US Transvaginal Non-ob  06/03/2015  CLINICAL DATA:  Right-sided pelvic pain for 5 days. Right adnexal tenderness. On Depo-Provera. EXAM: TRANSABDOMINAL AND TRANSVAGINAL ULTRASOUND OF PELVIS TECHNIQUE: Both transabdominal and transvaginal ultrasound examinations of the pelvis were  performed. Transabdominal technique was performed for global imaging of the pelvis including uterus, ovaries, adnexal regions, and pelvic cul-de-sac. It was necessary to proceed with endovaginal exam following the transabdominal exam to visualize the endometrium and ovaries. COMPARISON:  04/10/2013 FINDINGS: Uterus Measurements: 5.6 x 2.8 x 3.4 cm. No fibroids or other mass visualized. Endometrium Thickness: 4 mm. A small amount of fluid is seen within the inferior portion of the endometrial cavity and endocervical canal. There is a rounded hypoechoic area seen in the inferior portion the endometrial cavity which measures approximately 5 x 10 mm. No internal blood flow is visualized. This could represent blood clot or endometrial polyp. Right ovary Measurements: 3.0 x 1.5 x 1.4 cm. Normal appearance/no adnexal mass. Left ovary Measurements: 2.9 x 1.5 x 1.7 cm. Normal appearance/no adnexal mass. Other findings No free fluid. IMPRESSION: 10 mm rounded hypoechoic lesion in inferior portion of endometrial cavity with small amount of fluid in endocervical canal. This could represent blood clot or endometrial polyp. Consider followup with transvaginal ultrasound in 6-8 weeks versus sonohysterogram. No evidence of fibroids or adnexal mass. Electronically Signed   By: Alver Sorrow.D.  On: 06/03/2015 13:12   US Pelvis Complete  06/03/2015  CLINICAL DATA:  Right-sided pelvic pain for 5 days. Right adnexal tenderness. On Depo-Provera. EXAM: TRANSABDOMINAL AND TRANSVAGINAL ULTRASOUND OF PELVIS TECHNIQUE: Both transabdominal and transvaginal ultrasound examinations of the pelvis were performed. Transabdominal technique was performed for global imaging of the pelvis including uterus, ovaries, adnexal regions, and pelvic cul-de-sac. It was necessary to proceed with endovaginal exam following the transabdominal exam to visualize the endometrium and ovaries. COMPARISON:  04/10/2013 FINDINGS: Uterus Measurements: 5.6 x 2.8 x  3.4 cm. No fibroids or other mass visualized. Endometrium Thickness: 4 mm. A small amount of fluid is seen within the inferior portion of the endometrial cavity and endocervical canal. There is a rounded hypoechoic area seen in the inferior portion the endometrial cavity which measures approximately 5 x 10 mm. No internal blood flow is visualized. This could represent blood clot or endometrial polyp. Right ovary Measurements: 3.0 x 1.5 x 1.4 cm. Normal appearance/no adnexal mass. Left ovary Measurements: 2.9 x 1.5 x 1.7 cm. Normal appearance/no adnexal mass. Other findings No free fluid. IMPRESSION: 10 mm rounded hypoechoic lesion in inferior portion of endometrial cavity with small amount of fluid in endocervical canal. This could represent blood clot or endometrial polyp. Consider followup with transvaginal ultrasound in 6-8 weeks versus sonohysterogram. No evidence of fibroids or adnexal mass. Electronically Signed   By: Myles Rosenthal M.D.   On: 06/03/2015 13:12   I have personally reviewed and evaluated these images and lab results as part of my medical decision-making.  Pelvic exam: normal external genitalia, vulva, vagina, cervix, uterus and adnexa, VULVA: normal appearing vulva with no masses, tenderness or lesions, VAGINA: normal appearing vagina with normal color and discharge, no lesions, vaginal discharge - white and curd-like, WET MOUNT done - results: white blood cells, DNA probe for chlamydia and GC obtained, CERVIX: normal appearing cervix without discharge or lesions, UTERUS: uterus is normal size, shape, consistency and nontender, ADNEXA: normal adnexa in size, nontender and no masses, tenderness right, mild, exam chaperoned by female tech.   MDM   Final diagnoses:  Lower abdominal pain  Right adnexal tenderness    Patient presents with lower abdominal pain, nausea and vaginal bleeding. Patient has been on Depakote shots for the past 6 years and reports she is not having menstrual cycle  since. Denies fever. VSS. Exam revealed tenderness in the lower quadrants, no peritoneal signs. Pelvic exam revealed mild right adnexal tenderness, otherwise unremarkable. Patient given Zofran and pain meds. Pregnancy negative. Labs unremarkable. Transvaginal ultrasound revealed 10 mm rounded hypoechoic lesion in inferior portion of endometrial cavity with small amount of fluid in endocervical canal which was thought to represent blood clot or endometrial polyp, advised follow-up ultrasound in 6-8 weeks. Discussed results and plan for discharge patient. Patient given rx for pain meds and anti-medics along with gynecology follow-up.  Evaluation does not show pathology requring ongoing emergent intervention or admission. Pt is hemodynamically stable and mentating appropriately. Discussed findings/results and plan with patient/guardian, who agrees with plan. All questions answered. Return precautions discussed and outpatient follow up given.      Satira Sark [place], [place] 06/03/15 1416  Courteney Randall An, MD 06/03/15 [phone]

## 2015-06-03 NOTE — Discharge Instructions (Signed)
Take your prescriptions as prescribed as needed. Follow-up with gynecology and 68 weeks for repeat ultrasound. Return to the emergency department if symptoms worsen or new onset of fever, vomiting, vaginal bleeding.

## 2015-06-04 ENCOUNTER — Telehealth: Payer: Self-pay | Admitting: *Deleted

## 2015-06-04 LAB — GC/CHLAMYDIA PROBE AMP (~~LOC~~) NOT AT ARMC
Chlamydia: NEGATIVE
Neisseria Gonorrhea: NEGATIVE

## 2015-06-04 LAB — HIV ANTIBODY (ROUTINE TESTING W REFLEX): HIV Screen 4th Generation wRfx: NONREACTIVE

## 2015-06-04 NOTE — Telephone Encounter (Signed)
Pt left message on 11/22 @ 1439 stating that [facility]. She is calling to schedule follow up appt and says that she needs another US scheduled.  Per chart review, US report on 11/22 states to consider repeat transvag US versus sonohysterogram in 6-8 wks.

## 2015-06-09 NOTE — Telephone Encounter (Signed)
Called patient and discussed that she needs to come in and f/u with a provider and then they can determine what follow up she may need. Patient verbalized understanding and had no questions. Patient will await phone call for appt.

## 2015-06-26 ENCOUNTER — Encounter: Payer: Self-pay | Admitting: Obstetrics and Gynecology

## 2015-06-26 ENCOUNTER — Ambulatory Visit (INDEPENDENT_AMBULATORY_CARE_PROVIDER_SITE_OTHER): Payer: Self-pay | Admitting: Obstetrics and Gynecology

## 2015-06-26 VITALS — BP 119/67 | HR 80 | Temp 98.4°F | Wt 128.0 lb

## 2015-06-26 DIAGNOSIS — N803 Endometriosis of pelvic peritoneum, unspecified: Secondary | ICD-10-CM

## 2015-06-26 DIAGNOSIS — N809 Endometriosis, unspecified: Secondary | ICD-10-CM

## 2015-06-26 NOTE — Progress Notes (Signed)
Patient ID: Jane [name], female   DOB: [birth year], 29 y.o.   MRN: [id number] 29 yo presenting today as an ED follow up. She was seen in November secondary to pelvic pain. She has a long standing h/o endometriosis which was well controlled with depo-provera. She had a period on 11/22 which brought on severe pelvic pain. She had been amenorrheic until that time. Patient reports being pain free since the resolution of her period which lasted 4 days.  Past Medical History  Diagnosis Date  . Migraine   . Endometriosis   . UTI (lower urinary tract infection)   . Bladder infection    Past Surgical History  Procedure Laterality Date  . Umbilical hernia repair     Family History  Problem Relation Age of Onset  . Hypertension Maternal Grandmother   . Stroke Maternal Grandmother   . Cancer Maternal Grandmother   . Cancer Paternal Grandmother    Social History  Substance Use Topics  . Smoking status: Never Smoker   . Smokeless tobacco: None  . Alcohol Use: No   ROS See pertinent in HPI  Blood pressure 119/67, pulse 80, temperature 98.4 F (36.9 C), weight 128 lb (58.06 kg). GENERAL: Well-developed, well-nourished female in no acute distress.  ABDOMEN: Soft, nontender, nondistended. No organomegaly. PELVIC: Normal external female genitalia. Vagina is pink and rugated.  Normal discharge. Normal appearing cervix. Uterus is normal in size. No adnexal mass or tenderness. EXTREMITIES: No cyanosis, clubbing, or edema, 2+ distal pulses.  06/03/2015 Ultrasound FINDINGS: Uterus  Measurements: 5.6 x 2.8 x 3.4 cm. No fibroids or other mass visualized.  Endometrium  Thickness: 4 mm. A small amount of fluid is seen within the inferior portion of the endometrial cavity and endocervical canal. There is a rounded hypoechoic area seen in the inferior portion the endometrial cavity which measures approximately 5 x 10 mm. No internal blood flow is visualized. This could represent blood clot  or endometrial polyp.  Right ovary  Measurements: 3.0 x 1.5 x 1.4 cm. Normal appearance/no adnexal mass.  Left ovary  Measurements: 2.9 x 1.5 x 1.7 cm. Normal appearance/no adnexal mass.  Other findings  No free fluid.  IMPRESSION: 10 mm rounded hypoechoic lesion in inferior portion of endometrial cavity with small amount of fluid in endocervical canal. This could represent blood clot or endometrial polyp. Consider followup with transvaginal ultrasound in 6-8 weeks versus sonohysterogram.  No evidence of fibroids or adnexal mass.  A/P 29 yo with endometriosis - Advised to continue depo-provera for now - Advised to keep a menstrual calendar - Return if monthly painful menses continue for a change of birth control - Follow up ultrasound ordered and patient will be contacted with any abnormal results

## 2015-08-28 ENCOUNTER — Telehealth: Payer: Self-pay | Admitting: *Deleted

## 2015-08-28 ENCOUNTER — Ambulatory Visit (HOSPITAL_COMMUNITY)
Admission: RE | Admit: 2015-08-28 | Discharge: 2015-08-28 | Disposition: A | Discharge status: Home / Self Care | Payer: Self-pay | Source: Ambulatory Visit | Attending: Obstetrics and Gynecology | Admitting: Obstetrics and Gynecology

## 2015-08-28 ENCOUNTER — Telehealth: Payer: Self-pay

## 2015-08-28 DIAGNOSIS — N809 Endometriosis, unspecified: Secondary | ICD-10-CM | POA: Insufficient documentation

## 2015-08-28 DIAGNOSIS — R102 Pelvic and perineal pain: Secondary | ICD-10-CM | POA: Insufficient documentation

## 2015-08-28 NOTE — Telephone Encounter (Signed)
Per message from Dr. Jolayne Panther called patient to notify she had a normal ultrasound.  Also that Dr. Jolayne Panther reccommends she continue contraception for endometriosis manangement.  Drinda voices understanding.

## 2015-08-28 NOTE — Telephone Encounter (Signed)
Spoke with patient she is aware of the normal ultrasound and will continued with birth control

## 2016-03-10 ENCOUNTER — Telehealth: Payer: Self-pay

## 2016-03-10 NOTE — Telephone Encounter (Signed)
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 She stated it is hard to get an appointment with our office. Please call patient.

## 2016-04-06 NOTE — Telephone Encounter (Signed)
This patient has not been seen in quite sometime. Patient needs appointment to come in. I attempted to call patient but there was no answer.

## 2016-10-03 ENCOUNTER — Emergency Department (HOSPITAL_COMMUNITY)
Admission: EM | Admit: 2016-10-03 | Discharge: 2016-10-04 | Disposition: A | Discharge status: Home / Self Care | Payer: BLUE CROSS/BLUE SHIELD | Attending: Emergency Medicine | Admitting: Emergency Medicine

## 2016-10-03 ENCOUNTER — Encounter (HOSPITAL_COMMUNITY): Payer: Self-pay | Admitting: Emergency Medicine

## 2016-10-03 ENCOUNTER — Emergency Department (HOSPITAL_COMMUNITY)
Admission: EM | Admit: 2016-10-03 | Discharge: 2016-10-03 | Disposition: A | Discharge status: Home / Self Care | Payer: BLUE CROSS/BLUE SHIELD | Source: Home / Self Care | Attending: Emergency Medicine | Admitting: Emergency Medicine

## 2016-10-03 ENCOUNTER — Encounter (HOSPITAL_COMMUNITY): Payer: Self-pay

## 2016-10-03 ENCOUNTER — Emergency Department (HOSPITAL_COMMUNITY): Payer: BLUE CROSS/BLUE SHIELD

## 2016-10-03 DIAGNOSIS — Z79899 Other long term (current) drug therapy: Secondary | ICD-10-CM | POA: Insufficient documentation

## 2016-10-03 DIAGNOSIS — N83202 Unspecified ovarian cyst, left side: Secondary | ICD-10-CM | POA: Diagnosis not present

## 2016-10-03 DIAGNOSIS — N730 Acute parametritis and pelvic cellulitis: Secondary | ICD-10-CM | POA: Insufficient documentation

## 2016-10-03 DIAGNOSIS — R102 Pelvic and perineal pain: Secondary | ICD-10-CM

## 2016-10-03 DIAGNOSIS — R109 Unspecified abdominal pain: Secondary | ICD-10-CM | POA: Diagnosis present

## 2016-10-03 LAB — COMPREHENSIVE METABOLIC PANEL
ALBUMIN: 4.1 g/dL (ref 3.5–5.0)
ALK PHOS: 63 U/L (ref 38–126)
ALT: 48 U/L (ref 14–54)
ANION GAP: 4 — AB (ref 5–15)
AST: 28 U/L (ref 15–41)
BILIRUBIN TOTAL: 0.6 mg/dL (ref 0.3–1.2)
BUN: 6 mg/dL (ref 6–20)
CALCIUM: 9.5 mg/dL (ref 8.9–10.3)
CO2: 26 mmol/L (ref 22–32)
Chloride: 109 mmol/L (ref 101–111)
Creatinine, Ser: 0.65 mg/dL (ref 0.44–1.00)
Glucose, Bld: 83 mg/dL (ref 65–99)
POTASSIUM: 4.1 mmol/L (ref 3.5–5.1)
Sodium: 139 mmol/L (ref 135–145)
Total Protein: 7.5 g/dL (ref 6.5–8.1)

## 2016-10-03 LAB — URINALYSIS, ROUTINE W REFLEX MICROSCOPIC
Bilirubin Urine: NEGATIVE
Glucose, UA: NEGATIVE mg/dL
Hgb urine dipstick: NEGATIVE
Ketones, ur: NEGATIVE mg/dL
Leukocytes, UA: NEGATIVE
Nitrite: NEGATIVE
Protein, ur: NEGATIVE mg/dL
Specific Gravity, Urine: 1.006 (ref 1.005–1.030)
pH: 8 (ref 5.0–8.0)

## 2016-10-03 LAB — CBC
HCT: 40.2 % (ref 36.0–46.0)
HEMOGLOBIN: 13.4 g/dL (ref 12.0–15.0)
MCH: 30.4 pg (ref 26.0–34.0)
MCHC: 33.3 g/dL (ref 30.0–36.0)
MCV: 91.2 fL (ref 78.0–100.0)
Platelets: 186 10*3/uL (ref 150–400)
RBC: 4.41 MIL/uL (ref 3.87–5.11)
RDW: 13 % (ref 11.5–15.5)
WBC: 4.2 10*3/uL (ref 4.0–10.5)

## 2016-10-03 LAB — I-STAT BETA HCG BLOOD, ED (MC, WL, AP ONLY)

## 2016-10-03 LAB — WET PREP, GENITAL
Clue Cells Wet Prep HPF POC: NONE SEEN
Sperm: NONE SEEN
Trich, Wet Prep: NONE SEEN
Yeast Wet Prep HPF POC: NONE SEEN

## 2016-10-03 LAB — LIPASE, BLOOD: Lipase: 33 U/L (ref 11–51)

## 2016-10-03 MED ORDER — ONDANSETRON HCL 4 MG/2ML IJ SOLN: 4.0000 mg | Freq: Once | INTRAMUSCULAR | Status: DC

## 2016-10-03 MED ORDER — DOXYCYCLINE HYCLATE 100 MG PO CAPS: 100.0000 mg | ORAL_CAPSULE | Freq: Two times a day (BID) | ORAL | 0 refills | Status: AC

## 2016-10-03 MED ORDER — FENTANYL CITRATE (PF) 100 MCG/2ML IJ SOLN: 50.0000 ug | Freq: Once | INTRAMUSCULAR | Status: DC

## 2016-10-03 MED ORDER — ONDANSETRON 4 MG PO TBDP
4.0000 mg | ORAL_TABLET | Freq: Once | ORAL | Status: AC
  Administered 2016-10-03: 4 mg via ORAL
  Filled 2016-10-03: qty 1

## 2016-10-03 MED ORDER — IOPAMIDOL (ISOVUE-300) INJECTION 61%
100.0000 mL | Freq: Once | INTRAVENOUS | Status: AC | PRN
  Administered 2016-10-03: 100 mL via INTRAVENOUS

## 2016-10-03 MED ORDER — IBUPROFEN 800 MG PO TABS
800.0000 mg | ORAL_TABLET | Freq: Once | ORAL | Status: DC
  Filled 2016-10-03: qty 1

## 2016-10-03 MED ORDER — CEFTRIAXONE SODIUM 250 MG IJ SOLR
250.0000 mg | Freq: Once | INTRAMUSCULAR | Status: AC
  Administered 2016-10-03: 250 mg via INTRAMUSCULAR
  Filled 2016-10-03: qty 250

## 2016-10-03 MED ORDER — SODIUM CHLORIDE 0.9 % IV BOLUS (SEPSIS)
1000.0000 mL | Freq: Once | INTRAVENOUS | Status: AC
  Administered 2016-10-03: 1000 mL via INTRAVENOUS

## 2016-10-03 MED ORDER — STERILE WATER FOR INJECTION IJ SOLN
INTRAMUSCULAR | Status: AC
  Administered 2016-10-03: 10 mL
  Filled 2016-10-03: qty 10

## 2016-10-03 MED ORDER — AZITHROMYCIN 1 G PO PACK
1.0000 g | PACK | Freq: Once | ORAL | Status: AC
  Administered 2016-10-03: 1 g via ORAL
  Filled 2016-10-03: qty 1

## 2016-10-03 NOTE — ED Triage Notes (Signed)
Per [facility]- Pt c/o of rt flank pain to RLQ- Stop DEPO in December and Sudden onset of Rt flank pain to RLQ. ? Pregnancy. Pt has been trying. Denies N/V/D and fever.

## 2016-10-03 NOTE — ED Provider Notes (Signed)
[facility]-EMERGENCY DEPT Provider Note   CSN: [id number] Arrival date & time: 10/03/16  1154     History   Chief Complaint Chief Complaint  Patient presents with  . Abdominal Pain    HPI TALESHA ELLITHORPE is a 31 y.o. female with hx endometriosis and ovarian cyst who presents to ED with chief complaint progressively worsening abdominal pain. She states she developed mild RLQ pain yesterday morning which improved yesterday but returned this morning. Associated with nausea but no vomiting. Pt tried to use a heat pack and return to sleep but awoke with continued abdominal pain. States pain is localized to RLQ and radiates to right flank, 4/10 at rest but can be 7/10 with movement. Walking and changing positions makes pain worse. She has a hx of endometriosis but states pain is usually more general in lower abdomen. Also states she has a hx of "blood clot on my ovary" 2 years ago which was followed up on with [place] and found to have resolved. She is unsure if she is pregnant, denies new partners, but stopped taking her depo-provera one month ago.   Denies HA, dizziness, CP/SOB, dysuria, hematuria, melena/hematochezia, back pain, vaginal itching/discharge/odor.   The history is provided by the patient.    Past Medical History:  Diagnosis Date  . Bladder infection   . Endometriosis   . Migraine   . UTI (lower urinary tract infection)     Patient Active Problem List   Diagnosis Date Noted  . Vitamin D deficiency 09/09/2014  . Dental cavities 10/09/2013  . Abdominal bloating 10/09/2013  . Right shoulder pain 10/09/2013  . 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 Other and unspecified ovarian cyst 01/09/2011  . Cocaine abuse, unspecified 01/09/2011    Past Surgical History:  Procedure Laterality Date  . UMBILICAL HERNIA REPAIR      OB History    No data available       Home Medications    Prior to Admission medications   Medication Sig Start Date End Date  Taking? Authorizing Provider  pseudoephedrine-acetaminophen (TYLENOL SINUS) 30-500 MG TABS Take 2 tablets by mouth every 4 (four) hours as needed (pain).   Yes Historical Provider, MD  sodium chloride (OCEAN) 0.65 % SOLN nasal spray Place 1 spray into both nostrils as needed for congestion.   Yes Historical Provider, MD  cephALEXin (KEFLEX) 500 MG capsule Take 1 capsule (500 mg total) by mouth 4 (four) times daily. For total of 5 days. Patient not taking: Reported on 10/03/2016 05/09/15   Myra Rude, MD  doxycycline (VIBRAMYCIN) 100 MG capsule Take 1 capsule (100 mg total) by mouth 2 (two) times daily. 10/03/16 10/17/16  Slayton Lubitz A Lucila Klecka, PA  fluticasone (FLONASE) 50 MCG/ACT nasal spray Place 2 sprays into both nostrils daily. Patient not taking: Reported on 10/03/2016 08/09/14   Doris Cheadle, MD  ondansetron (ZOFRAN ODT) 4 MG disintegrating tablet Take 1 tablet (4 mg total) by mouth every 8 (eight) hours as needed for nausea or vomiting. Patient not taking: Reported on 10/03/2016 06/03/15   Barrett Henle, PA-C  traMADol (ULTRAM) 50 MG tablet Take 1 tablet (50 mg total) by mouth every 6 (six) hours as needed. Patient not taking: Reported on 10/03/2016 06/03/15   Barrett Henle, PA-C  Vitamin D, Ergocalciferol, (DRISDOL) 50000 UNITS CAPS capsule Take 1 capsule (50,000 Units total) by mouth every 7 (seven) days. For 8 weeks Patient not taking: Reported on 06/26/2015 09/09/14   Dessa Phi, MD  Family History Family History  Problem Relation Age of Onset  . Hypertension Maternal Grandmother   . Stroke Maternal Grandmother   . Cancer Maternal Grandmother   . Cancer Paternal Grandmother     Social History Social History  Substance Use Topics  . Smoking status: Never Smoker  . Smokeless tobacco: Never Used  . Alcohol use No     Allergies   Toradol [ketorolac tromethamine]; Clindamycin/lincomycin; and Vicodin [hydrocodone-acetaminophen]   Review of Systems Review of  Systems  Constitutional: Negative for chills and fever.  Respiratory: Negative for shortness of breath.   Cardiovascular: Negative for chest pain.  Gastrointestinal: Positive for abdominal distention, abdominal pain and nausea. Negative for blood in stool, constipation, diarrhea and vomiting.  Genitourinary: Positive for flank pain. Negative for dysuria, hematuria, vaginal bleeding, vaginal discharge and vaginal pain.  Musculoskeletal: Negative for back pain and myalgias.  Skin: Negative for pallor and rash.  Neurological: Negative for dizziness and headaches.     Physical Exam Updated Vital Signs BP 114/79   Pulse 70   Temp 98.5 F (36.9 C) (Oral)   Resp 14   Ht 5' 6.75" (1.695 m)   Wt 61.2 kg   SpO2 100%   BMI 21.30 kg/m   Physical Exam  Constitutional: She is oriented to person, place, and time. She appears well-developed and well-nourished. No distress.  HENT:  Head: Normocephalic and atraumatic.  Eyes: Conjunctivae are normal. Right eye exhibits no discharge. Left eye exhibits no discharge. No scleral icterus.  Neck: No JVD present. No tracheal deviation present.  Cardiovascular: Normal rate, regular rhythm, normal heart sounds and intact distal pulses.   Pulmonary/Chest: Effort normal and breath sounds normal.  Abdominal: Soft. Bowel sounds are normal. She exhibits no mass. There is tenderness. No hernia.  TTP of RLQ, mild TTP LLQ, mild TTP McBurney's point, negative Psoas and obturator tests, negative Murphey's.   Genitourinary:  Genitourinary Comments: No flank pain on palpation, no CVA tenderness. External genitalia normal with no lesions or ulcerations. Minimal white chunky discharge, vaginal walls normal, no odor or bleeding, cervix pink and without petechia. CMT on the right, no tenderness on the left, uterus soft and symmetrical.   Musculoskeletal: Normal range of motion.  Moves all extremities spontaneously  Neurological: She is alert and oriented to person,  place, and time.  Skin: Skin is warm and dry. Capillary refill takes less than 2 seconds. She is not diaphoretic.  Psychiatric: She has a normal mood and affect. Her behavior is normal. Judgment and thought content normal.     ED Treatments / Results  Labs (all labs ordered are listed, but only abnormal results are displayed) Labs Reviewed  WET PREP, GENITAL - Abnormal; Notable for the following:       Result Value   WBC, Wet Prep HPF POC MODERATE (*)    All other components within normal limits  URINALYSIS, ROUTINE W REFLEX MICROSCOPIC - Abnormal; Notable for the following:    Color, Urine STRAW (*)    All other components within normal limits  COMPREHENSIVE METABOLIC PANEL - Abnormal; Notable for the following:    Anion gap 4 (*)    All other components within normal limits  LIPASE, BLOOD  CBC  I-STAT BETA HCG BLOOD, ED ([facility], [facility], [facility] ONLY)  GC/CHLAMYDIA PROBE AMP ([facility]) NOT AT [facility]    EKG  EKG Interpretation None       Radiology Ct Abdomen Pelvis W Contrast  Result Date: 10/03/2016 CLINICAL DATA:  Right  flank and right lower quadrant pain EXAM: CT ABDOMEN AND PELVIS WITH CONTRAST TECHNIQUE: Multidetector CT imaging of the abdomen and pelvis was performed using the standard protocol following bolus administration of intravenous contrast. CONTRAST:  ISOVUE-300 IOPAMIDOL (ISOVUE-300) INJECTION 61% COMPARISON:  None. FINDINGS: Lower chest: Lung bases are clear. Hepatobiliary: No focal liver lesions are apparent on this study. Gallbladder wall is not thickened. There is no biliary duct dilatation. Pancreas: No pancreatic mass or inflammatory focus. Spleen: No splenic lesions evident. Adrenals/Urinary Tract: Adrenals appear normal bilaterally. There is a 4 mm cyst in the anterior upper pole right kidney. There is a 4 mm cyst in the posterior upper pole right kidney. There is no hydronephrosis on either side. There is no renal or ureteral calculus on either side. Urinary  bladder is midline with wall thickness within normal limits. Stomach/Bowel: Rectum is distended with stool and air. There is no bowel wall or mesenteric thickening. No bowel obstruction. No free air or portal venous air. Vascular/Lymphatic: There is no abdominal aortic aneurysm. No vascular lesions are evident. No adenopathy is appreciable in the abdomen or pelvis. Reproductive: Uterus is anteverted. No pelvic mass. A small amount of free fluid is present in the cul-de-sac. Other: Appendix is not well seen. There is no periappendiceal region inflammation. There is no abscess or ascites in the abdomen or pelvis. Musculoskeletal: No blastic or lytic bone lesions. No intramuscular or abdominal wall lesion. IMPRESSION: Appendix not well seen. No periappendiceal region inflammation evident. Small amount of fluid in the cul-de-sac may represent recent ovarian cyst rupture. No bowel obstruction. No abscess. No renal or ureteral calculus. No hydronephrosis. Electronically Signed   By: Bretta Bang III M.D.   On: 10/03/2016 14:58    Procedures Procedures (including critical care time)  Medications Ordered in ED Medications  ibuprofen (ADVIL,MOTRIN) tablet 800 mg (0 mg Oral Hold 10/03/16 1247)  ondansetron (ZOFRAN-ODT) disintegrating tablet 4 mg (4 mg Oral Given 10/03/16 1246)  iopamidol (ISOVUE-300) 61 % injection 100 mL (100 mLs Intravenous Contrast Given 10/03/16 1420)  cefTRIAXone (ROCEPHIN) injection 250 mg (250 mg Intramuscular Given 10/03/16 1545)  azithromycin (ZITHROMAX) powder 1 g (1 g Oral Given 10/03/16 1542)  sterile water (preservative free) injection (10 mLs  Given 10/03/16 1545)     Initial Impression / Assessment and Plan / ED Course  I have reviewed the triage vital signs and the nursing notes.  Pertinent labs & imaging results that were available during my care of the patient were reviewed by me and considered in my medical decision making (see chart for details).     31yof presents  with chief complaint RLQ pain x 2 days, with associated nausea. Pt afebrile, VSS. CBC, CMP, Lipase, UA unremarkable. Negative pregnancy. Pelvic exam displays minimal white discharge and CMT on the right. CT abdomen pelvis w contrast difficult to visualize appendix but no periappendiceal inflammation evident. Low suspicion of appendicitis in an afebrile pt and advanced imaging non-concerning. Small amount of fluid in cul-de-sac which may represent ovarian cyst rupture. Low suspicion of TOA, ectopic pregnancy, or torsion. Wet mount positive for moderate WBC. This coupled with CMT on exam and adnexal tenderness  In a high risk population (young female of child bearing age) raises suspicion for PID. Pt given ceftriaxone and azithromycin in ED, provided rx for doxycycline 100mg  bid x14 days. Pt declined HIV and syphilis testing at this time. Recommend follow up for abdominal pain and CT scan findings with pt's OBGYN; she will follow up within  3 days. Discussed ED return precautions, including development of fever/chills and worsening pain. Pt seen and evaluated by Dr. Rhunette Croft.  Final Clinical Impressions(s) / ED Diagnoses   Final diagnoses:  PID (acute pelvic inflammatory disease)  Pelvic pain in female    New Prescriptions Discharge Medication List as of 10/03/2016  4:08 PM    START taking these medications   Details  doxycycline (VIBRAMYCIN) 100 MG capsule Take 1 capsule (100 mg total) by mouth 2 (two) times daily., Starting Sun 10/03/2016, Until Sun 10/17/2016, Print         Jeanie Sewer, PA 10/03/16 1747    Derwood Kaplan, MD 10/04/16 [id number]

## 2016-10-03 NOTE — ED Notes (Signed)
Pt is back from CT

## 2016-10-03 NOTE — ED Triage Notes (Signed)
Per EMS, pt from home w/ c/o abdominal pain, N/V.  [facility], diagnosed w/ PID, was given antibiotics and sent home.  Upon arrival she was hypotensive at 84/58, dia phoric, N/V/D.  Was given .3mg  EPI IM, 50mg  IV benadryl, 500 lt NS and 4 zofran.  Not complaining of N/V at this time.

## 2016-10-03 NOTE — ED Notes (Signed)
[facility] Provider at bedside. 

## 2016-10-03 NOTE — ED Provider Notes (Signed)
[facility] Provider Note   CSN: [id number] Arrival date & time: 10/03/16  2250   By signing my name below, I, Clovis Pu, attest that this documentation has been prepared under the direction and in the presence of Charlynne Pander, MD  Electronically Signed: Clovis Pu, ED Scribe. 10/03/16. 11:18 PM.   History   Chief Complaint Chief Complaint  Patient presents with  . Abdominal Pain  . Nausea    HPI Comments:  Jane [name] is a 31 y.o. female who presents to the Emergency Department, via EMS, complaining of worsening abdominal pain which she describes as a cramping sensation x today. She states she sat down to have a bowel movement after which she began to experience palpitations, vomiting and lightheadedness. She has taken 500 mg Tylenol with no relief. Pt was also treated with an EPI pen and was given benadryl en route to the ED by EMS. Pt denies a rash or any other associated symptoms. She notes she is allergic to Toradol, Vicodin. Per chart review, pt was seen at [facility] earlier today, was diagnosed with PID and was prescribed 100 mg doxycycline. At that visit the pt had normal lab works and a CT-scan which showed fluid in her pelvic area that is concerning for a possible ovarian cyst rupture. Patient was also given azithromycin, ceftriaxone in the ED for possible PID. No other complaints noted at this time.   The history is provided by the patient and medical records. No language interpreter was used.    Past Medical History:  Diagnosis Date  . Bladder infection   . Endometriosis   . Migraine   . UTI (lower urinary tract infection)     Patient Active Problem List   Diagnosis Date Noted  . Vitamin D deficiency 09/09/2014  . Dental cavities 10/09/2013  . Abdominal bloating 10/09/2013  . Right shoulder pain 10/09/2013  . 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 Other and unspecified ovarian cyst 01/09/2011  . Cocaine abuse, unspecified  01/09/2011    Past Surgical History:  Procedure Laterality Date  . UMBILICAL HERNIA REPAIR      OB History    No data available       Home Medications    Prior to Admission medications   Medication Sig Start Date End Date Taking? Authorizing Provider  cephALEXin (KEFLEX) 500 MG capsule Take 1 capsule (500 mg total) by mouth 4 (four) times daily. For total of 5 days. Patient not taking: Reported on 10/03/2016 05/09/15   Myra Rude, MD  doxycycline (VIBRAMYCIN) 100 MG capsule Take 1 capsule (100 mg total) by mouth 2 (two) times daily. 10/03/16 10/17/16  Mina A Fawze, PA  fluticasone (FLONASE) 50 MCG/ACT nasal spray Place 2 sprays into both nostrils daily. Patient not taking: Reported on 10/03/2016 08/09/14   Doris Cheadle, MD  ondansetron (ZOFRAN ODT) 4 MG disintegrating tablet Take 1 tablet (4 mg total) by mouth every 8 (eight) hours as needed for nausea or vomiting. Patient not taking: Reported on 10/03/2016 06/03/15   Barrett Henle, PA-C  pseudoephedrine-acetaminophen (TYLENOL SINUS) 30-500 MG TABS Take 2 tablets by mouth every 4 (four) hours as needed (pain).    Historical Provider, MD  sodium chloride (OCEAN) 0.65 % SOLN nasal spray Place 1 spray into both nostrils as needed for congestion.    Historical Provider, MD  traMADol (ULTRAM) 50 MG tablet Take 1 tablet (50 mg total) by mouth every 6 (six) hours as needed. Patient not taking: Reported on  10/03/2016 06/03/15   Barrett HenleNicole Elizabeth Nadeau, PA-C  Vitamin D, Ergocalciferol, (DRISDOL) 50000 UNITS CAPS capsule Take 1 capsule (50,000 Units total) by mouth every 7 (seven) days. For 8 weeks Patient not taking: Reported on 06/26/2015 09/09/14   Dessa PhiJosalyn Funches, MD    Family History Family History  Problem Relation Age of Onset  . Hypertension Maternal Grandmother   . Stroke Maternal Grandmother   . Cancer Maternal Grandmother   . Cancer Paternal Grandmother     Social History Social History  Substance Use Topics  .  Smoking status: Never Smoker  . Smokeless tobacco: Never Used  . Alcohol use No     Allergies   Toradol [ketorolac tromethamine]; Clindamycin/lincomycin; and Vicodin [hydrocodone-acetaminophen]   Review of Systems Review of Systems  Constitutional: Positive for diaphoresis.  Cardiovascular: Positive for palpitations.  Gastrointestinal: Positive for abdominal pain, nausea and vomiting.  Genitourinary: Positive for pelvic pain.  Skin: Negative for rash.  Neurological: Positive for light-headedness.  All other systems reviewed and are negative.    Physical Exam Updated Vital Signs Pulse 81   Temp 98.2 F (36.8 C) (Oral)   Resp 18   SpO2 97%   Physical Exam  Constitutional: She is oriented to person, place, and time. She appears well-developed and well-nourished. No distress.  HENT:  Head: Normocephalic and atraumatic.  Eyes: EOM are normal.  Neck: Normal range of motion.  Cardiovascular: Normal rate, regular rhythm and normal heart sounds.   Pulmonary/Chest: Effort normal and breath sounds normal.  Abdominal: Soft. She exhibits no distension. There is tenderness.  Mild diffuse pelvic tenderness. Worse on the left.   Musculoskeletal: Normal range of motion.  Neurological: She is alert and oriented to person, place, and time.  Skin: Skin is warm and dry.  Psychiatric: She has a normal mood and affect. Judgment normal.  Nursing note and vitals reviewed.    ED Treatments / Results  DIAGNOSTIC STUDIES:  Oxygen Saturation is 97% on RA, normal by my interpretation.    COORDINATION OF CARE:  11:13 PM Discussed treatment plan with pt at bedside and pt agreed to plan.  Labs (all labs ordered are listed, but only abnormal results are displayed) Labs Reviewed - No data to display  EKG  EKG Interpretation None       Radiology Ct Abdomen Pelvis W Contrast  Result Date: 10/03/2016 CLINICAL DATA:  Right flank and right lower quadrant pain EXAM: CT ABDOMEN AND  PELVIS WITH CONTRAST TECHNIQUE: Multidetector CT imaging of the abdomen and pelvis was performed using the standard protocol following bolus administration of intravenous contrast. CONTRAST:  100mL ISOVUE-300 IOPAMIDOL (ISOVUE-300) INJECTION 61% COMPARISON:  None. FINDINGS: Lower chest: Lung bases are clear. Hepatobiliary: No focal liver lesions are apparent on this study. Gallbladder wall is not thickened. There is no biliary duct dilatation. Pancreas: No pancreatic mass or inflammatory focus. Spleen: No splenic lesions evident. Adrenals/Urinary Tract: Adrenals appear normal bilaterally. There is a 4 mm cyst in the anterior upper pole right kidney. There is a 4 mm cyst in the posterior upper pole right kidney. There is no hydronephrosis on either side. There is no renal or ureteral calculus on either side. Urinary bladder is midline with wall thickness within normal limits. Stomach/Bowel: Rectum is distended with stool and air. There is no bowel wall or mesenteric thickening. No bowel obstruction. No free air or portal venous air. Vascular/Lymphatic: There is no abdominal aortic aneurysm. No vascular lesions are evident. No adenopathy is appreciable in the abdomen or  pelvis. Reproductive: Uterus is anteverted. No pelvic mass. A small amount of free fluid is present in the cul-de-sac. Other: Appendix is not well seen. There is no periappendiceal region inflammation. There is no abscess or ascites in the abdomen or pelvis. Musculoskeletal: No blastic or lytic bone lesions. No intramuscular or abdominal wall lesion. IMPRESSION: Appendix not well seen. No periappendiceal region inflammation evident. Small amount of fluid in the cul-de-sac may represent recent ovarian cyst rupture. No bowel obstruction. No abscess. No renal or ureteral calculus. No hydronephrosis. Electronically Signed   By: Bretta Bang III M.D.   On: 10/03/2016 14:58    Procedures Procedures (including critical care time)  Medications  Ordered in ED Medications - No data to display   Initial Impression / Assessment and Plan / ED Course  I have reviewed the triage vital signs and the nursing notes.  Pertinent labs & imaging results that were available during my care of the patient were reviewed by me and considered in my medical decision making (see chart for details).     Jane [name] is a 31 y.o. female here with near syncope, worsening pelvic pain. Remains afebrile. Reviewed record from earlier today. Had nl WBC count, nl UA, CT that showed pelvic fluid. Wet prep showed some WBC but no obvious BV or trichomonas. I think likely pain from ruptured cyst, less likely torsion. Will repeat blood work, get transvag US.   1:37 AM BP remains stable. WBC still nl. US showed L 1.3 cm cyst with no torsion. Pain controlled now. She may have a reaction to azithromycin that was given in the ED. She didn't fill prescription for doxycycline but I have low suspicion for PID (afebrile, no WBC count) and since patient already got azithromycin, will not need to start doxycyline. Will prescribe 5 tabs of percocet for pain, motrin for mild pain. Likely had pain from ovarian cyst.     Final Clinical Impressions(s) / ED Diagnoses   Final diagnoses:  None    New Prescriptions New Prescriptions   No medications on file  I personally performed the services described in this documentation, which was scribed in my presence. The recorded information has been reviewed and is accurate.    Charlynne Pander, MD 10/04/16 [phone]

## 2016-10-03 NOTE — ED Notes (Signed)
Bed: WA07 Expected date:  Expected time:  Means of arrival:  Comments: 31 yo abd pain

## 2016-10-04 LAB — COMPREHENSIVE METABOLIC PANEL
ALT: 43 U/L (ref 14–54)
ANION GAP: 8 (ref 5–15)
AST: 26 U/L (ref 15–41)
Albumin: 3.8 g/dL (ref 3.5–5.0)
Alkaline Phosphatase: 55 U/L (ref 38–126)
BILIRUBIN TOTAL: 0.3 mg/dL (ref 0.3–1.2)
BUN: 7 mg/dL (ref 6–20)
CO2: 23 mmol/L (ref 22–32)
Calcium: 8.9 mg/dL (ref 8.9–10.3)
Chloride: 108 mmol/L (ref 101–111)
Creatinine, Ser: 0.94 mg/dL (ref 0.44–1.00)
GFR calc non Af Amer: >60 mL/min (ref >60)
GLUCOSE: 80 mg/dL (ref 65–99)
POTASSIUM: 4 mmol/L (ref 3.5–5.1)
SODIUM: 139 mmol/L (ref 135–145)
TOTAL PROTEIN: 6.7 g/dL (ref 6.5–8.1)

## 2016-10-04 LAB — CBC WITH DIFFERENTIAL/PLATELET
BASOS PCT: 0 %
Basophils Absolute: 0 10*3/uL (ref 0.0–0.1)
EOS ABS: 0 10*3/uL (ref 0.0–0.7)
Eosinophils Relative: 0 %
HEMATOCRIT: 40.7 % (ref 36.0–46.0)
Hemoglobin: 13 g/dL (ref 12.0–15.0)
Lymphocytes Relative: 12 %
Lymphs Abs: 1.2 10*3/uL (ref 0.7–4.0)
MCH: 29.6 pg (ref 26.0–34.0)
MCHC: 31.9 g/dL (ref 30.0–36.0)
MCV: 92.7 fL (ref 78.0–100.0)
MONO ABS: 0.4 10*3/uL (ref 0.1–1.0)
MONOS PCT: 4 %
Neutro Abs: 8.1 10*3/uL — ABNORMAL HIGH (ref 1.7–7.7)
Neutrophils Relative %: 84 %
Platelets: 174 10*3/uL (ref 150–400)
RBC: 4.39 MIL/uL (ref 3.87–5.11)
RDW: 13.2 % (ref 11.5–15.5)
WBC: 9.7 10*3/uL (ref 4.0–10.5)

## 2016-10-04 LAB — GC/CHLAMYDIA PROBE AMP (~~LOC~~) NOT AT ARMC
Chlamydia: NEGATIVE
Neisseria Gonorrhea: NEGATIVE

## 2016-10-04 LAB — I-STAT BETA HCG BLOOD, ED (MC, WL, AP ONLY): I-stat hCG, quantitative: <5 m[IU]/mL (ref <5)

## 2016-10-04 MED ORDER — IBUPROFEN 600 MG PO TABS: 600.0000 mg | ORAL_TABLET | Freq: Four times a day (QID) | ORAL | 0 refills | Status: DC | PRN

## 2016-10-04 MED ORDER — FENTANYL CITRATE (PF) 100 MCG/2ML IJ SOLN
25.0000 ug | Freq: Once | INTRAMUSCULAR | Status: AC
  Administered 2016-10-04: 25 ug via INTRAVENOUS
  Filled 2016-10-04: qty 2

## 2016-10-04 MED ORDER — OXYCODONE-ACETAMINOPHEN 5-325 MG PO TABS: 1.0000 | ORAL_TABLET | ORAL | 0 refills | Status: DC | PRN

## 2016-10-04 NOTE — ED Notes (Signed)
Patient transported to Ultrasound 

## 2016-10-04 NOTE — Discharge Instructions (Signed)
Take motrin for pain.   Take percocet for severe pain.   See your doctor.   Fill doxycyline only if you have pelvic discharge or fever or severe pain tomorrow. Otherwise, you don't have to take it right now  Return to ER if you have worse pelvic pain, passing out, vomiting, fevers.

## 2016-10-29 ENCOUNTER — Ambulatory Visit (INDEPENDENT_AMBULATORY_CARE_PROVIDER_SITE_OTHER): Payer: BLUE CROSS/BLUE SHIELD | Admitting: Obstetrics and Gynecology

## 2016-10-29 ENCOUNTER — Encounter: Payer: Self-pay | Admitting: Obstetrics and Gynecology

## 2016-10-29 VITALS — BP 108/66 | HR 85 | Wt 131.5 lb

## 2016-10-29 DIAGNOSIS — N83209 Unspecified ovarian cyst, unspecified side: Secondary | ICD-10-CM | POA: Diagnosis not present

## 2016-10-29 NOTE — Progress Notes (Signed)
31 yo presenting today as an ED follow up for a left ovarian cyst. Patient reports improvement in her pain. She is sexually active without contraception. She recently discontinue depo-provera 1 month ago and has not had a period yet. She plans to conceive in the next few months and is not interested in contraception  Past Medical History:  Diagnosis Date  . Bladder infection   . Endometriosis   . Migraine   . UTI (lower urinary tract infection)    Past Surgical History:  Procedure Laterality Date  . UMBILICAL HERNIA REPAIR     Family History  Problem Relation Age of Onset  . Hypertension Maternal Grandmother   . Stroke Maternal Grandmother   . Cancer Maternal Grandmother   . Cancer Paternal Grandmother    Social History  Substance Use Topics  . Smoking status: Never Smoker  . Smokeless tobacco: Never Used  . Alcohol use No   ROS See pertinent in HPI  Blood pressure 108/66, pulse 85, weight 131 lb 8 oz (59.6 kg), last menstrual period 05/13/2015. GENERAL: Well-developed, well-nourished female in no acute distress.  ABDOMEN: Soft, nontender, nondistended. No organomegaly. PELVIC: Not performed. EXTREMITIES: No cyanosis, clubbing, or edema, 2+ distal pulses.  FINDINGS: Uterus  Measurements: 4.4 x 2.2 x 2.8 cm. No fibroids or other mass visualized.  Endometrium  Thickness: 3 mm.  No focal abnormality visualized.  Right ovary  Measurements: 2.4 x 1.9 x 1.7 cm. Normal appearance/no adnexal mass.  Left ovary  Measurements: 2.7 x 1.5 x 1.4 cm. Normal appearance/no adnexal mass. 1.3 x 0.9 x 1.0 cm mother with probable daughter cyst, likely physiologic.  Pulsed Doppler evaluation of both ovaries demonstrates normal low-resistance arterial and venous waveforms.  Other findings  No abnormal free fluid.  IMPRESSION: 1. No acute abnormality identified within the pelvis. 2. 1.3 cm left ovarian cyst, consistent with a normal physiologic cyst. No appreciable  free fluid within the pelvis.   Electronically Signed   By: Rise Mu M.D.   On: 10/04/2016 01:13  A/P 31 yo here for follow up on physiologist cyst - Advised the use of ibuprofen and heating pad for pain - encouraged the patient to start taking prenatal vitamins prior to conception - Also informed the patient that there may be a delay in the onset of her periods following discontinuation of depo-provera - RTC prn

## 2016-11-01 ENCOUNTER — Telehealth: Payer: Self-pay

## 2016-11-01 NOTE — Telephone Encounter (Signed)
Pt called and stated that she has some f/u questions and she wants her STI results.  Contacted pt and pt informed pt of negative STD results.  Pt then requested clarity on the pain that she is sometimes having since the provider has advised her to f/u with PCP because her pain may be related to abdominal issues.  I explained to the pt that I would follow providers recommendation and f/u with her PCP.  Pt stated understanding with no further questions.

## 2017-02-01 ENCOUNTER — Telehealth: Payer: Self-pay | Admitting: General Practice

## 2017-02-01 DIAGNOSIS — N926 Irregular menstruation, unspecified: Secondary | ICD-10-CM

## 2017-02-01 MED ORDER — IBUPROFEN 800 MG PO TABS: 800.0000 mg | ORAL_TABLET | Freq: Three times a day (TID) | ORAL | 2 refills | Status: DC | PRN

## 2017-02-01 NOTE — Telephone Encounter (Signed)
Patient called and left message stating she needs a prescription for ibuprofen 800mg  because her cycle has started back and she is hurting really bad. Per Dr Erin FullingHarraway-Smith, may send in ibuprofen 800mg  disp 30 2 refills. Called patient, no answer- left message stating we are trying to reach you to return your phone call. We did receive your message and got your refill sent to your pharmacy. You may call us back if you have questions

## 2017-02-28 ENCOUNTER — Telehealth: Payer: Self-pay | Admitting: Lab

## 2017-02-28 NOTE — Telephone Encounter (Addendum)
I called Jane [name] and she states she called Friday 02/25/17 because she was having a lot of pain and has a history of endometriosis which she used to get depo for but is not going to be taking anymore. States she was trying to avoid going to the hospital . 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 States she wants to know what steps she needs to take to get stronger medicine. States she has a history of going to the hospital to get morphine or dilaudid injections when pain is severe.  I informed her I would message the doctor and we will get back to her when we hear from the doctor.

## 2017-02-28 NOTE — Telephone Encounter (Signed)
Patient called stating she  need a refill on her Percocet.

## 2017-03-01 NOTE — Telephone Encounter (Signed)
Called patient and asked if she was currently trying to get pregnant & she states she is. Reviewed optimal window for conceiving/when to have intercourse with patient & to take a PNV. Discussed that when she gets pregnant, pain will improve. Discussed continuing ibuprofen for optimal pain relief as we typically do not give out narcotics. Patient verbalized understanding & states that her periods are just so painful and really makes work unbearable if she has a period when she works. patient states she will continue ibuprofen although at times it doesn't help much. Discussed with patient that we can refer her to a pain clinic if she would like. Patient verbalized understanding to all & would like referral. Phone number given to [facility] & told patient I would complete and send off referral today but generally there is a waiting period. Patient verbalized understanding and had no questions. Referral form completed & records to be faxed by front office staff.

## 2017-04-07 ENCOUNTER — Telehealth: Payer: Self-pay | Admitting: *Deleted

## 2017-04-07 NOTE — Telephone Encounter (Signed)
Jane [name] called 04/06/17 pm and left a message she is calling to get an update about a referral to pain center since [facility] is not wanting to prescribe stonger pain medicine.

## 2017-04-12 NOTE — Telephone Encounter (Signed)
Patient left a message on the nurse voicemail today at 1030.  States she would like to know the status of the referral to a pain clinic.  States she is in pain due to endometriosis.  I will check with Dr. Malva Limes to see if he is still working on the endometriosis study.  Maybe patient would be eligible for this.  If not, will work on referral to pain clinic.

## 2017-04-14 ENCOUNTER — Encounter: Payer: Self-pay | Admitting: *Deleted

## 2017-04-14 NOTE — Telephone Encounter (Signed)
No return call from Dr. Ewell Poe office regarding research study.  Called [facility] to see if previously faxed records had been received and what next steps to obtain appointment would be.  Was informed records had not been received.  Obtained fax # [phone].  Records refaxed.  Called and confirmed with office that records were received.  Office states they have someone reviewing the records and they will reach out to the patient within the next couple of days.  Attempted to contact patient via phone.  Unable to reach and unable to leave message.  Will send patient a letter to make her aware.

## 2017-06-22 ENCOUNTER — Other Ambulatory Visit: Payer: Self-pay

## 2017-06-22 DIAGNOSIS — R5381 Other malaise: Secondary | ICD-10-CM

## 2017-06-26 ENCOUNTER — Encounter (HOSPITAL_COMMUNITY): Payer: Self-pay

## 2017-06-26 ENCOUNTER — Other Ambulatory Visit: Payer: Self-pay

## 2017-06-26 ENCOUNTER — Ambulatory Visit (HOSPITAL_COMMUNITY)
Admission: EM | Admit: 2017-06-26 | Discharge: 2017-06-26 | Disposition: A | Discharge status: Home / Self Care | Payer: BLUE CROSS/BLUE SHIELD | Attending: Internal Medicine | Admitting: Internal Medicine

## 2017-06-26 DIAGNOSIS — N6310 Unspecified lump in the right breast, unspecified quadrant: Secondary | ICD-10-CM | POA: Diagnosis not present

## 2017-06-26 NOTE — ED Triage Notes (Signed)
Pt presents today with a breast lump on right breast that she noticed a week ago. Has not increased in size that she is aware of. Did notice a few days before she noticed the lump that she was having some pain in that area.

## 2017-06-26 NOTE — ED Provider Notes (Signed)
[facility]    CSN: [id number] Arrival date & time: 06/26/17  1502     History   Chief Complaint Chief Complaint  Patient presents with  . Breast Mass    HPI Jane [name] is a 31 y.o. female.   31 year old female, with history of migraines, endometriosis, presenting today complaining of right breast mass.  States that she noticed the area about a week ago.  States that just prior to noticing the lump, she had a few days of sharp pain.  She has had no pain since that time.  Patient states that she has [facility] to [facility] as well as the breast center and was told to come here for a referral.  States that she has a family history of breast cancer.  She denies any skin changes to the right breast.   The history is provided by the patient.  Abscess  Location:  Torso Torso abscess location:  R breast Size:  1 cm Abscess quality: not draining, no fluctuance, no induration, no itching, not painful, no redness, no warmth and not weeping   Red streaking: no   Progression:  Unchanged Chronicity:  New Context: not diabetes, not immunosuppression, not injected drug use and not insect bite/sting   Relieved by:  Nothing Worsened by:  Nothing Ineffective treatments:  None tried Associated symptoms: no anorexia, no fatigue, no fever, no headaches, no nausea and no vomiting     Past Medical History:  Diagnosis Date  . Bladder infection   . Endometriosis   . Migraine   . UTI (lower urinary tract infection)     Patient Active Problem List   Diagnosis Date Noted  . Vitamin D deficiency 09/09/2014  . Dental cavities 10/09/2013  . Abdominal bloating 10/09/2013  . Right shoulder pain 10/09/2013  . 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 Other and unspecified ovarian cyst 01/09/2011  . Cocaine abuse, unspecified 01/09/2011    Past Surgical History:  Procedure Laterality Date  . UMBILICAL HERNIA REPAIR      OB History    No data  available       Home Medications    Prior to Admission medications   Medication Sig Start Date End Date Taking? Authorizing Provider  acetaminophen (TYLENOL) 500 MG tablet Take 500 mg by mouth once.   Yes [provider]  ibuprofen (ADVIL,MOTRIN) 800 MG tablet Take 1 tablet (800 mg total) by mouth every 8 (eight) hours as needed. 02/01/17  Yes Willodean RosenthalHarraway-Smith, Carolyn, MD  cephALEXin (KEFLEX) 500 MG capsule Take 1 capsule (500 mg total) by mouth 4 (four) times daily. For total of 5 days. Patient not taking: Reported on 10/03/2016 05/09/15   Myra RudeSchmitz, Jeremy E, MD  fluticasone [facility](FLONASE) 50 MCG/ACT nasal spray Place 2 sprays into both nostrils daily. Patient not taking: Reported on 10/03/2016 08/09/14   Doris CheadleAdvani, Deepak, MD  ondansetron (ZOFRAN ODT) 4 MG disintegrating tablet Take 1 tablet (4 mg total) by mouth every 8 (eight) hours as needed for nausea or vomiting. Patient not taking: Reported on 10/03/2016 06/03/15   Barrett HenleNadeau, Nicole Elizabeth, PA-C  oxyCODONE-acetaminophen (PERCOCET) 5-325 MG tablet Take 1-2 tablets by mouth every 4 (four) hours as needed. Patient not taking: Reported on 10/29/2016 10/04/16   Charlynne PanderYao, David Hsienta, MD  pseudoephedrine-acetaminophen (TYLENOL SINUS) 30-500 MG TABS Take 2 tablets by mouth every 4 (four) hours as needed (pain).    [provider]  sodium chloride (OCEAN) 0.65 % SOLN nasal spray Place 1 spray into  both nostrils as needed for congestion.    [provider]  traMADol (ULTRAM) 50 MG tablet Take 1 tablet (50 mg total) by mouth every 6 (six) hours as needed. Patient not taking: Reported on 10/03/2016 06/03/15   Barrett HenleNadeau, Nicole Elizabeth, PA-C  Vitamin D, Ergocalciferol, (DRISDOL) 50000 UNITS CAPS capsule Take 1 capsule (50,000 Units total) by mouth every 7 (seven) days. For 8 weeks Patient not taking: Reported on 06/26/2015 09/09/14   Dessa PhiFunches, Josalyn, MD    Family History Family History  Problem Relation Age of Onset  . Hypertension  Maternal Grandmother   . Stroke Maternal Grandmother   . Cancer Maternal Grandmother   . Cancer Paternal Grandmother     Social History Social History   Tobacco Use  . Smoking status: Never Smoker  . Smokeless tobacco: Never Used  Substance Use Topics  . Alcohol use: No  . Drug use: No     Allergies   Toradol [ketorolac tromethamine]; Clindamycin/lincomycin; and Vicodin [hydrocodone-acetaminophen]   Review of Systems Review of Systems  Constitutional: Negative for chills, fatigue and fever.  HENT: Negative for ear pain and sore throat.   Eyes: Negative for pain and visual disturbance.  Respiratory: Negative for cough and shortness of breath.   Cardiovascular: Negative for chest pain and palpitations.  Gastrointestinal: Negative for abdominal pain, anorexia, nausea and vomiting.  Genitourinary: Negative for dysuria and hematuria.       Right breast lump   Musculoskeletal: Negative for arthralgias and back pain.  Skin: Negative for color change and rash.  Neurological: Negative for seizures, syncope and headaches.  All other systems reviewed and are negative.    Physical Exam Triage Vital Signs ED Triage Vitals  Enc Vitals Group     BP 06/26/17 1536 107/65     Pulse Rate 06/26/17 1536 83     Resp 06/26/17 1536 16     Temp 06/26/17 1536 98.4 F (36.9 C)     Temp Source 06/26/17 1536 Oral     SpO2 06/26/17 1536 100 %     Weight --      Height --      Head Circumference --      Peak Flow --      Pain Score 06/26/17 1538 0     Pain Loc --      Pain Edu? --      Excl. in GC? --    No data found.  Updated Vital Signs BP 107/65 (BP Location: Left Arm)   Pulse 83   Temp 98.4 F (36.9 C) (Oral)   Resp 16   SpO2 100%   Visual Acuity Right Eye Distance:   Left Eye Distance:   Bilateral Distance:    Right Eye Near:   Left Eye Near:    Bilateral Near:     Physical Exam  Constitutional: She appears well-developed and well-nourished. No distress.  HENT:   Head: Normocephalic and atraumatic.  Eyes: Conjunctivae are normal.  Neck: Neck supple.  Cardiovascular: Normal rate and regular rhythm.  No murmur heard. Pulmonary/Chest: Effort normal and breath sounds normal. No respiratory distress. Right breast exhibits mass.  Small nontender palpable lump noted to the right breast at the 12 o'clock position.  No overlying skin changes    Abdominal: Soft. There is no tenderness.  Musculoskeletal: She exhibits no edema.  Neurological: She is alert.  Skin: Skin is warm and dry.  Psychiatric: She has a normal mood and affect.  Nursing note and vitals reviewed.  UC Treatments / Results  Labs (all labs ordered are listed, but only abnormal results are displayed) Labs Reviewed - No data to display  EKG  EKG Interpretation None       Radiology No results found.  Procedures Procedures (including critical care time)  Medications Ordered in UC Medications - No data to display   Initial Impression / Assessment and Plan / UC Course  I have reviewed the triage vital signs and the nursing notes.  Pertinent labs & imaging results that were available during my care of the patient were reviewed by me and considered in my medical decision making (see chart for details).     Right breast lump - referral to breast center for mammogram   Final Clinical Impressions(s) / UC Diagnoses   Final diagnoses:  Breast mass, right    ED Discharge Orders    None       Controlled Substance Prescriptions [place] Controlled Substance Registry consulted? Not Applicable   Alecia Lemming, [place] 06/26/17 1556

## 2017-06-28 ENCOUNTER — Other Ambulatory Visit: Payer: Self-pay | Admitting: Physician Assistant

## 2017-06-28 DIAGNOSIS — N6315 Unspecified lump in the right breast, overlapping quadrants: Secondary | ICD-10-CM

## 2017-06-28 DIAGNOSIS — N6310 Unspecified lump in the right breast, unspecified quadrant: Principal | ICD-10-CM

## 2017-06-30 ENCOUNTER — Other Ambulatory Visit: Payer: Self-pay | Admitting: Physician Assistant

## 2017-06-30 ENCOUNTER — Ambulatory Visit
Admission: RE | Admit: 2017-06-30 | Discharge: 2017-06-30 | Disposition: A | Discharge status: Home / Self Care | Payer: BLUE CROSS/BLUE SHIELD | Source: Ambulatory Visit | Attending: Physician Assistant | Admitting: Physician Assistant

## 2017-06-30 DIAGNOSIS — N6310 Unspecified lump in the right breast, unspecified quadrant: Secondary | ICD-10-CM

## 2017-06-30 DIAGNOSIS — N6315 Unspecified lump in the right breast, overlapping quadrants: Secondary | ICD-10-CM

## 2017-08-05 ENCOUNTER — Emergency Department (HOSPITAL_COMMUNITY)
Admission: EM | Admit: 2017-08-05 | Discharge: 2017-08-05 | Disposition: A | Discharge status: Home / Self Care | Payer: BLUE CROSS/BLUE SHIELD | Attending: Emergency Medicine | Admitting: Emergency Medicine

## 2017-08-05 ENCOUNTER — Encounter (HOSPITAL_COMMUNITY): Payer: Self-pay | Admitting: Emergency Medicine

## 2017-08-05 DIAGNOSIS — R109 Unspecified abdominal pain: Secondary | ICD-10-CM | POA: Diagnosis present

## 2017-08-05 DIAGNOSIS — Z5321 Procedure and treatment not carried out due to patient leaving prior to being seen by health care provider: Secondary | ICD-10-CM | POA: Insufficient documentation

## 2017-08-05 LAB — COMPREHENSIVE METABOLIC PANEL
ALT: 17 U/L (ref 14–54)
AST: 19 U/L (ref 15–41)
Albumin: 4.2 g/dL (ref 3.5–5.0)
Alkaline Phosphatase: 81 U/L (ref 38–126)
Anion gap: 7 (ref 5–15)
BILIRUBIN TOTAL: 0.6 mg/dL (ref 0.3–1.2)
BUN: 9 mg/dL (ref 6–20)
CHLORIDE: 106 mmol/L (ref 101–111)
CO2: 26 mmol/L (ref 22–32)
CREATININE: 0.62 mg/dL (ref 0.44–1.00)
Calcium: 9.1 mg/dL (ref 8.9–10.3)
Glucose, Bld: 110 mg/dL — ABNORMAL HIGH (ref 65–99)
POTASSIUM: 3.8 mmol/L (ref 3.5–5.1)
Sodium: 139 mmol/L (ref 135–145)
TOTAL PROTEIN: 7.3 g/dL (ref 6.5–8.1)

## 2017-08-05 LAB — CBC
HCT: 38.5 % (ref 36.0–46.0)
Hemoglobin: 12.4 g/dL (ref 12.0–15.0)
MCH: 29.8 pg (ref 26.0–34.0)
MCHC: 32.2 g/dL (ref 30.0–36.0)
MCV: 92.5 fL (ref 78.0–100.0)
PLATELETS: 195 10*3/uL (ref 150–400)
RBC: 4.16 MIL/uL (ref 3.87–5.11)
RDW: 13.1 % (ref 11.5–15.5)
WBC: 7.7 10*3/uL (ref 4.0–10.5)

## 2017-08-05 LAB — I-STAT BETA HCG BLOOD, ED (MC, WL, AP ONLY)

## 2017-08-05 LAB — LIPASE, BLOOD: LIPASE: 38 U/L (ref 11–51)

## 2017-08-05 NOTE — ED Notes (Signed)
Patient was taken home by family

## 2017-08-05 NOTE — ED Notes (Signed)
Pts family is taking her home. Stated to take her off the list

## 2017-08-05 NOTE — ED Notes (Signed)
Patient in bathroom in lobby 

## 2017-08-05 NOTE — ED Triage Notes (Signed)
Per EMS-history of endometriosis-pain every month when cycle started-states this pain is worse than it normally is-patient took 2 Vicodin-last dose was at 3pm-no relief

## 2017-09-15 ENCOUNTER — Ambulatory Visit (INDEPENDENT_AMBULATORY_CARE_PROVIDER_SITE_OTHER): Payer: BLUE CROSS/BLUE SHIELD | Admitting: Obstetrics and Gynecology

## 2017-09-15 ENCOUNTER — Encounter: Payer: Self-pay | Admitting: Obstetrics and Gynecology

## 2017-09-15 VITALS — BP 108/68 | HR 79 | Ht 66.75 in | Wt 122.8 lb

## 2017-09-15 DIAGNOSIS — Z113 Encounter for screening for infections with a predominantly sexual mode of transmission: Secondary | ICD-10-CM | POA: Diagnosis not present

## 2017-09-15 DIAGNOSIS — N979 Female infertility, unspecified: Secondary | ICD-10-CM | POA: Diagnosis not present

## 2017-09-15 DIAGNOSIS — N760 Acute vaginitis: Secondary | ICD-10-CM | POA: Diagnosis not present

## 2017-09-15 LAB — POCT URINALYSIS DIP (DEVICE)
Bilirubin Urine: NEGATIVE
Glucose, UA: NEGATIVE mg/dL
Hgb urine dipstick: NEGATIVE
Ketones, ur: NEGATIVE mg/dL
LEUKOCYTES UA: NEGATIVE
NITRITE: NEGATIVE
PROTEIN: NEGATIVE mg/dL
SPECIFIC GRAVITY, URINE: 1.02 (ref 1.005–1.030)
UROBILINOGEN UA: 0.2 mg/dL (ref 0.0–1.0)
pH: 7.5 (ref 5.0–8.0)

## 2017-09-15 MED ORDER — PANTOPRAZOLE SODIUM 40 MG PO TBEC: 40.0000 mg | DELAYED_RELEASE_TABLET | Freq: Every day | ORAL | 6 refills | Status: DC

## 2017-09-15 NOTE — Progress Notes (Signed)
32 yo P0 here for the evaluation of vaginitis and infertility. Patient reports the presence of a vaginal discharge for the past week. Although she denies an odor or pruritis, she reports some occasional sharp vaginal pain. Patient has a history of endometriosis, previously medically managed with depo-provera. She reports a monthly cycle which is painful. She treats her dysmenorrhea with ibuprofen. She states that she has been actively trying to conceive since July without success. She is not a smoker. Her partner smokes occasionally. He has fathered a child previously.  Past Medical History:  Diagnosis Date  . Bladder infection   . Endometriosis   . Migraine   . UTI (lower urinary tract infection)    Past Surgical History:  Procedure Laterality Date  . UMBILICAL HERNIA REPAIR     Family History  Problem Relation Age of Onset  . Hypertension Maternal Grandmother   . Stroke Maternal Grandmother   . Cancer Maternal Grandmother   . Cancer Paternal Grandmother    Social History   Tobacco Use  . Smoking status: Never Smoker  . Smokeless tobacco: Never Used  Substance Use Topics  . Alcohol use: No  . Drug use: No   ROS See pertinent in HPI  Blood pressure 108/68, pulse 79, height 5' 6.75" (1.695 m), weight 122 lb 12.8 oz (55.7 kg), last menstrual period 08/29/2017. GENERAL: Well-developed, well-nourished female in no acute distress.  ABDOMEN: Soft, nontender, nondistended. No organomegaly. PELVIC: Normal external female genitalia. Vagina is pink and rugated.  Normal discharge. Normal appearing cervix. Uterus is normal in size. No adnexal mass or tenderness. EXTREMITIES: No cyanosis, clubbing, or edema, 2+ distal pulses.  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 - Upon further discussion, we decided a  referral to infertility specialist would better serve her given her history. Patient agrees to this referral  - Patient will be contacted with any abnormal results

## 2017-09-17 LAB — CERVICOVAGINAL ANCILLARY ONLY
Bacterial vaginitis: NEGATIVE
CHLAMYDIA, DNA PROBE: NEGATIVE
Candida vaginitis: NEGATIVE
Neisseria Gonorrhea: NEGATIVE
TRICH (WINDOWPATH): NEGATIVE

## 2017-10-04 ENCOUNTER — Telehealth: Payer: Self-pay | Admitting: *Deleted

## 2017-10-04 NOTE — Telephone Encounter (Signed)
Patient left voice mail. Need a prescription for something to help with her endometriosis pain as she will be going out of town and will be on her cycle. Please return call.

## 2017-10-04 NOTE — Telephone Encounter (Signed)
Returned call. Voice mail left stating I am returning her call.

## 2017-10-06 ENCOUNTER — Other Ambulatory Visit: Payer: Self-pay | Admitting: General Practice

## 2017-10-06 DIAGNOSIS — N809 Endometriosis, unspecified: Secondary | ICD-10-CM

## 2017-10-06 MED ORDER — TRAMADOL HCL 50 MG PO TABS: 50.0000 mg | ORAL_TABLET | Freq: Four times a day (QID) | ORAL | 0 refills | Status: DC | PRN

## 2017-10-06 NOTE — Telephone Encounter (Signed)
Patient came by office and had concerns addressed in person

## 2017-10-06 NOTE — Progress Notes (Signed)
Patient came by office requesting medication for pain for upcoming menstrual cycle. Patient states her cycle is so painful she is in bed for days. Patient states cycle is about to start and she will be on a business trip and needs medication to get through. Per Dr Jolayne Pantheronstant, patient can have Rx for tramadol 50mg  disp 30 no refills. Informed patient. She verbalized understanding & had no questions

## 2018-01-03 ENCOUNTER — Ambulatory Visit
Admission: RE | Admit: 2018-01-03 | Discharge: 2018-01-03 | Disposition: A | Discharge status: Home / Self Care | Payer: BLUE CROSS/BLUE SHIELD | Source: Ambulatory Visit | Attending: Physician Assistant | Admitting: Physician Assistant

## 2018-01-03 ENCOUNTER — Other Ambulatory Visit: Payer: Self-pay | Admitting: Physician Assistant

## 2018-01-03 DIAGNOSIS — N6001 Solitary cyst of right breast: Secondary | ICD-10-CM

## 2018-01-03 DIAGNOSIS — N6310 Unspecified lump in the right breast, unspecified quadrant: Secondary | ICD-10-CM

## 2018-01-09 ENCOUNTER — Ambulatory Visit
Admission: RE | Admit: 2018-01-09 | Discharge: 2018-01-09 | Disposition: A | Discharge status: Home / Self Care | Payer: BLUE CROSS/BLUE SHIELD | Source: Ambulatory Visit | Attending: Physician Assistant | Admitting: Physician Assistant

## 2018-01-09 ENCOUNTER — Other Ambulatory Visit: Payer: Self-pay | Admitting: Physician Assistant

## 2018-01-09 DIAGNOSIS — N6001 Solitary cyst of right breast: Secondary | ICD-10-CM

## 2018-01-09 DIAGNOSIS — N6310 Unspecified lump in the right breast, unspecified quadrant: Secondary | ICD-10-CM

## 2018-02-27 ENCOUNTER — Ambulatory Visit (INDEPENDENT_AMBULATORY_CARE_PROVIDER_SITE_OTHER): Payer: BLUE CROSS/BLUE SHIELD | Admitting: *Deleted

## 2018-02-27 DIAGNOSIS — R350 Frequency of micturition: Secondary | ICD-10-CM

## 2018-02-27 DIAGNOSIS — N898 Other specified noninflammatory disorders of vagina: Secondary | ICD-10-CM

## 2018-02-27 DIAGNOSIS — Z113 Encounter for screening for infections with a predominantly sexual mode of transmission: Secondary | ICD-10-CM

## 2018-02-27 LAB — POCT URINALYSIS DIP (DEVICE)
Bilirubin Urine: NEGATIVE
Glucose, UA: 100 mg/dL — AB
Ketones, ur: NEGATIVE mg/dL
Leukocytes, UA: NEGATIVE
NITRITE: POSITIVE — AB
PH: 5 (ref 5.0–8.0)
PROTEIN: NEGATIVE mg/dL
Specific Gravity, Urine: <=1.005 (ref 1.005–1.030)
Urobilinogen, UA: 1 mg/dL (ref 0.0–1.0)

## 2018-02-27 MED ORDER — SULFAMETHOXAZOLE-TRIMETHOPRIM 800-160 MG PO TABS: 1.0000 | ORAL_TABLET | Freq: Two times a day (BID) | ORAL | 0 refills | Status: DC

## 2018-02-27 NOTE — Progress Notes (Signed)
Patient seen and assessed by nursing staff.  Agree with documentation and plan.  

## 2018-02-27 NOTE — Progress Notes (Signed)
Pt reports frequency and urgency of urination x5-7 days. She denies dysuria. Yesterday she began having some stomach upset and thought she may have UTI.  She started on OTC AZO yesterday. She has also observed vaginal irritation and white discharge w/odor x3 days. CCUA obtained and was indicative of +UTI w/moderate blood and positive Nitrites. Specimen sent to lab for culture and Rx given for Septra DS per standing order. Pt advised to continue OTC AZO if desired/needed. Self swab for GC/Chlamydia, trich, yeast and BV performed.  Pt will be notified of results and any further treatment indicated via MyChart. Pt voiced understanding.

## 2018-02-28 LAB — URINE CULTURE

## 2018-02-28 LAB — CERVICOVAGINAL ANCILLARY ONLY
BACTERIAL VAGINITIS: NEGATIVE
CHLAMYDIA, DNA PROBE: NEGATIVE
Candida vaginitis: NEGATIVE
NEISSERIA GONORRHEA: NEGATIVE
Trichomonas: NEGATIVE

## 2018-05-23 ENCOUNTER — Other Ambulatory Visit: Payer: Self-pay

## 2018-05-23 ENCOUNTER — Emergency Department (HOSPITAL_COMMUNITY)
Admission: EM | Admit: 2018-05-23 | Discharge: 2018-05-23 | Disposition: A | Discharge status: Home / Self Care | Payer: BLUE CROSS/BLUE SHIELD | Attending: Emergency Medicine | Admitting: Emergency Medicine

## 2018-05-23 ENCOUNTER — Emergency Department (HOSPITAL_COMMUNITY): Payer: BLUE CROSS/BLUE SHIELD

## 2018-05-23 ENCOUNTER — Encounter (HOSPITAL_COMMUNITY): Payer: Self-pay | Admitting: Emergency Medicine

## 2018-05-23 DIAGNOSIS — R109 Unspecified abdominal pain: Secondary | ICD-10-CM | POA: Diagnosis present

## 2018-05-23 DIAGNOSIS — R102 Pelvic and perineal pain: Secondary | ICD-10-CM | POA: Diagnosis not present

## 2018-05-23 DIAGNOSIS — F1410 Cocaine abuse, uncomplicated: Secondary | ICD-10-CM | POA: Insufficient documentation

## 2018-05-23 DIAGNOSIS — N809 Endometriosis, unspecified: Secondary | ICD-10-CM

## 2018-05-23 LAB — URINALYSIS, ROUTINE W REFLEX MICROSCOPIC
Bilirubin Urine: NEGATIVE
Glucose, UA: NEGATIVE mg/dL
Ketones, ur: NEGATIVE mg/dL
Leukocytes, UA: NEGATIVE
Nitrite: NEGATIVE
PROTEIN: NEGATIVE mg/dL
SPECIFIC GRAVITY, URINE: 1.017 (ref 1.005–1.030)
pH: 6 (ref 5.0–8.0)

## 2018-05-23 LAB — COMPREHENSIVE METABOLIC PANEL
ALT: 14 U/L (ref 0–44)
AST: 18 U/L (ref 15–41)
Albumin: 4.4 g/dL (ref 3.5–5.0)
Alkaline Phosphatase: 60 U/L (ref 38–126)
Anion gap: 6 (ref 5–15)
BILIRUBIN TOTAL: 0.6 mg/dL (ref 0.3–1.2)
BUN: 11 mg/dL (ref 6–20)
CHLORIDE: 111 mmol/L (ref 98–111)
CO2: 26 mmol/L (ref 22–32)
CREATININE: 0.76 mg/dL (ref 0.44–1.00)
Calcium: 9.4 mg/dL (ref 8.9–10.3)
GFR calc Af Amer: >60 mL/min (ref >60)
Glucose, Bld: 99 mg/dL (ref 70–99)
POTASSIUM: 4.6 mmol/L (ref 3.5–5.1)
Sodium: 143 mmol/L (ref 135–145)
Total Protein: 7.5 g/dL (ref 6.5–8.1)

## 2018-05-23 LAB — CBC
HEMATOCRIT: 42.9 % (ref 36.0–46.0)
HEMOGLOBIN: 13.3 g/dL (ref 12.0–15.0)
MCH: 29.8 pg (ref 26.0–34.0)
MCHC: 31 g/dL (ref 30.0–36.0)
MCV: 96 fL (ref 80.0–100.0)
Platelets: 173 10*3/uL (ref 150–400)
RBC: 4.47 MIL/uL (ref 3.87–5.11)
RDW: 12.6 % (ref 11.5–15.5)
WBC: 6.1 10*3/uL (ref 4.0–10.5)
nRBC: 0 % (ref 0.0–0.2)

## 2018-05-23 LAB — WET PREP, GENITAL
Clue Cells Wet Prep HPF POC: NONE SEEN
Sperm: NONE SEEN
Trich, Wet Prep: NONE SEEN
Yeast Wet Prep HPF POC: NONE SEEN

## 2018-05-23 LAB — I-STAT BETA HCG BLOOD, ED (MC, WL, AP ONLY)

## 2018-05-23 LAB — LIPASE, BLOOD: LIPASE: 37 U/L (ref 11–51)

## 2018-05-23 MED ORDER — TRAMADOL HCL 50 MG PO TABS: 50.0000 mg | ORAL_TABLET | Freq: Four times a day (QID) | ORAL | 0 refills | Status: DC | PRN

## 2018-05-23 MED ORDER — FENTANYL CITRATE (PF) 100 MCG/2ML IJ SOLN
50.0000 ug | Freq: Once | INTRAMUSCULAR | Status: AC
  Administered 2018-05-23: 50 ug via INTRAVENOUS
  Filled 2018-05-23: qty 2

## 2018-05-23 MED ORDER — ONDANSETRON HCL 4 MG/2ML IJ SOLN
4.0000 mg | Freq: Once | INTRAMUSCULAR | Status: AC
  Administered 2018-05-23: 4 mg via INTRAVENOUS
  Filled 2018-05-23: qty 2

## 2018-05-23 MED ORDER — MORPHINE SULFATE (PF) 4 MG/ML IV SOLN
4.0000 mg | Freq: Once | INTRAVENOUS | Status: AC
  Administered 2018-05-23: 4 mg via INTRAVENOUS

## 2018-05-23 MED ORDER — ONDANSETRON HCL 4 MG PO TABS: 4.0000 mg | ORAL_TABLET | Freq: Four times a day (QID) | ORAL | 0 refills | Status: DC

## 2018-05-23 MED ORDER — SODIUM CHLORIDE 0.9 % IV BOLUS
500.0000 mL | Freq: Once | INTRAVENOUS | Status: AC
  Administered 2018-05-23: 500 mL via INTRAVENOUS

## 2018-05-23 MED ORDER — SODIUM CHLORIDE 0.9 % IV BOLUS
1000.0000 mL | Freq: Once | INTRAVENOUS | Status: AC
  Administered 2018-05-23: 1000 mL via INTRAVENOUS

## 2018-05-23 MED ORDER — MORPHINE SULFATE (PF) 4 MG/ML IV SOLN
4.0000 mg | Freq: Once | INTRAVENOUS | Status: DC
  Filled 2018-05-23: qty 1

## 2018-05-23 NOTE — ED Provider Notes (Signed)
[facility]-EMERGENCY DEPT Provider Note   CSN: [id number] Arrival date & time: 05/23/18  0940     History   Chief Complaint Chief Complaint  Patient presents with  . Abdominal Pain    HPI Jane [name] is a 32 y.o. female with a history of endometriosis, frequent UTIs and ovarian cyst who presents emergency department today for abdominal pain.  Patient reports that she frequently has painful menstrual cycles.  She reports that she usually is able to control her pain with ibuprofen and Ultram.  She reports she started her menstrual cycle yesterday, 11/11, and had her typical lower abdominal and pelvic pain that was controlled with ibuprofen and Ultram.  She reports when she woke today however the pain was more severe and was associate with nausea as well as one episode of nonbilious, nonbloody emesis.  She reports she has not been able to take any of her home medications secondary to the nausea and vomiting for which she presented here today.  Patient denies any fever, chills, upper abdominal pain, flank pain, urinary symptoms.  She does have mild vaginal bleeding as she is on her menstrual cycle.  No other discharge.  Patient is sexually active.  She has had a prior umbilical hernia repair.  She reports normal bowel movement since onset of pain.  She is still passing gas.  HPI  Past Medical History:  Diagnosis Date  . Bladder infection   . Endometriosis   . Migraine   . UTI (lower urinary tract infection)     Patient Active Problem List   Diagnosis Date Noted  . Vitamin D deficiency 09/09/2014  . Dental cavities 10/09/2013  . Abdominal bloating 10/09/2013  . Right shoulder pain 10/09/2013  . 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 Other and unspecified ovarian cyst 01/09/2011  . Cocaine abuse, unspecified 01/09/2011    Past Surgical History:  Procedure Laterality Date  . UMBILICAL HERNIA REPAIR       OB History   None       Home Medications    Prior to Admission medications   Medication Sig Start Date End Date Taking? Authorizing Provider  acetaminophen (TYLENOL) 500 MG tablet Take 500 mg by mouth once.    [provider]  fluticasone (FLONASE) 50 MCG/ACT nasal spray Place 2 sprays into both nostrils daily. Patient not taking: Reported on 10/03/2016 08/09/14   Doris CheadleAdvani, Deepak, MD  ibuprofen (ADVIL,MOTRIN) 800 MG tablet Take 1 tablet (800 mg total) by mouth every 8 (eight) hours as needed. 02/01/17   Willodean RosenthalHarraway-Smith, Carolyn, MD  oxyCODONE-acetaminophen (PERCOCET) 5-325 MG tablet Take 1-2 tablets by mouth every 4 (four) hours as needed. Patient not taking: Reported on 10/29/2016 10/04/16   Charlynne PanderYao, David Hsienta, MD  pantoprazole (PROTONIX) 40 MG tablet Take 1 tablet (40 mg total) by mouth daily. Patient not taking: Reported on 02/27/2018 09/15/17   Constant, Peggy, MD  pseudoephedrine-acetaminophen (TYLENOL SINUS) 30-500 MG TABS Take 2 tablets by mouth every 4 (four) hours as needed (pain).    [provider]  sodium chloride (OCEAN) 0.65 % SOLN nasal spray Place 1 spray into both nostrils as needed for congestion.    [provider]  sulfamethoxazole-trimethoprim (BACTRIM DS,SEPTRA DS) 800-160 MG tablet Take 1 tablet by mouth 2 (two) times daily. 02/27/18   Reva BoresPratt, Tanya S, MD  traMADol (ULTRAM) 50 MG tablet Take 1 tablet (50 mg total) by mouth every 6 (six) hours as needed. 10/06/17   Constant, Gigi GinPeggy, MD  Family History Family History  Problem Relation Age of Onset  . Hypertension Maternal Grandmother   . Stroke Maternal Grandmother   . Cancer Maternal Grandmother   . Cancer Paternal Grandmother     Social History Social History   Tobacco Use  . Smoking status: Never Smoker  . Smokeless tobacco: Never Used  Substance Use Topics  . Alcohol use: No  . Drug use: No     Allergies   Toradol [ketorolac tromethamine]; Clindamycin/lincomycin; and Vicodin  [hydrocodone-acetaminophen]   Review of Systems Review of Systems  All other systems reviewed and are negative.    Physical Exam Updated Vital Signs BP 115/66   Pulse 79   Temp 97.7 F (36.5 C) (Oral)   Resp 16   Ht 5\' 6"  (1.676 m)   Wt 54.4 kg   LMP 05/22/2018   SpO2 100%   BMI 19.37 kg/m   Physical Exam  Constitutional: She appears well-developed and well-nourished.  HENT:  Head: Normocephalic and atraumatic.  Right Ear: External ear normal.  Left Ear: External ear normal.  Nose: Nose normal.  Mouth/Throat: Uvula is midline, oropharynx is clear and moist and mucous membranes are normal. No tonsillar exudate.  Eyes: Pupils are equal, round, and reactive to light. Right eye exhibits no discharge. Left eye exhibits no discharge. No scleral icterus.  Neck: Trachea normal. Neck supple. No spinous process tenderness present. No neck rigidity. Normal range of motion present.  Cardiovascular: Normal rate, regular rhythm and intact distal pulses.  No murmur heard. Pulses:      Radial pulses are 2+ on the right side, and 2+ on the left side.       Dorsalis pedis pulses are 2+ on the right side, and 2+ on the left side.       Posterior tibial pulses are 2+ on the right side, and 2+ on the left side.  No lower extremity swelling or edema. Calves symmetric in size bilaterally.  Pulmonary/Chest: Effort normal and breath sounds normal. She exhibits no tenderness.  Abdominal: Soft. Bowel sounds are normal. She exhibits no distension. There is tenderness in the suprapubic area and left lower quadrant. There is no rigidity, no rebound, no guarding, no CVA tenderness, no tenderness at McBurney's point and negative Murphy's sign.  Genitourinary:  Genitourinary Comments: Exam performed by Jacinto Halim, exam chaperoned Pelvic exam: normal external genitalia without evidence of trauma. VULVA: normal appearing vulva with no masses, tenderness or lesion. VAGINA: normal appearing vagina  with normal color and discharge, no lesions. CERVIX: normal appearing cervix without lesions, cervical motion tenderness absent, cervical os closed with out purulent discharge; vaginal discharge - bloody and scant, Wet prep and DNA probe for chlamydia and GC obtained.   ADNEXA: normal adnexa in size and no masses. Tenderness of the adnexa on left UTERUS: uterus is normal size, shape, consistency and nontender.   Musculoskeletal: She exhibits no edema.  Lymphadenopathy:    She has no cervical adenopathy.  Neurological: She is alert.  Skin: Skin is warm and dry. No rash noted. She is not diaphoretic.  Psychiatric: She has a normal mood and affect.  Nursing note and vitals reviewed.    ED Treatments / Results  Labs (all labs ordered are listed, but only abnormal results are displayed) Labs Reviewed  URINALYSIS, ROUTINE W REFLEX MICROSCOPIC - Abnormal; Notable for the following components:      Result Value   Hgb urine dipstick LARGE (*)    Bacteria, UA RARE (*)  All other components within normal limits  WET PREP, GENITAL  LIPASE, BLOOD  COMPREHENSIVE METABOLIC PANEL  CBC  I-STAT BETA HCG BLOOD, ED ([facility], [facility], [facility] ONLY)  GC/CHLAMYDIA PROBE AMP ([place]) NOT AT [facility]    EKG None  Radiology US Transvaginal Non-ob  Result Date: 05/23/2018 CLINICAL DATA:  Pelvic pain. EXAM: TRANSABDOMINAL AND TRANSVAGINAL ULTRASOUND OF PELVIS DOPPLER ULTRASOUND OF OVARIES TECHNIQUE: Both transabdominal and transvaginal ultrasound examinations of the pelvis were performed. Transabdominal technique was performed for global imaging of the pelvis including uterus, ovaries, adnexal regions, and pelvic cul-de-sac. It was necessary to proceed with endovaginal exam following the transabdominal exam to visualize the uterus and ovaries. Color and duplex Doppler ultrasound was utilized to evaluate blood flow to the ovaries. COMPARISON:  No recent. FINDINGS: Uterus Measurements: 6.5 x 2.5 x 3.7 = volume: 31.7 mL.  No focal abnormality identified. Endometrium Thickness: 5.2 mm.  No focal abnormality visualized. Right ovary Measurements: 2.6 x 2.0 x 1.7 cm = volume: 4.6 mL. Tiny simple follicular cyst noted. Left ovary Measurements: 2.8 x 1.2 x 3.3 cm = volume: 5.7 mL. 2.1 cm hypoechoic flattened lesion noted the left ovary most likely complex cyst, possibly a collapsing hemorrhagic cyst. Pregnancy test suggested to exclude ectopic pregnancy. Follow-up exam can be obtained to demonstrate resolution. Pulsed Doppler evaluation of both ovaries demonstrates normal low-resistance arterial and venous waveforms. Other findings No abnormal free fluid. IMPRESSION: 1. 2.1 cm hypoechoic flat lesion noted within the left ovary most likely a complex cyst, possibly a collapsing hemorrhagic cyst. Pregnancy test to exclude ectopic pregnancy suggested. Short-interval follow up ultrasound in 6-12 weeks is recommended, preferably during the week following the patient's normal menses. 2.  Exam otherwise unremarkable. Electronically Signed   By: Maisie Fus  Register   On: 05/23/2018 12:58   US Pelvis Complete  Result Date: 05/23/2018 CLINICAL DATA:  Pelvic pain. EXAM: TRANSABDOMINAL AND TRANSVAGINAL ULTRASOUND OF PELVIS DOPPLER ULTRASOUND OF OVARIES TECHNIQUE: Both transabdominal and transvaginal ultrasound examinations of the pelvis were performed. Transabdominal technique was performed for global imaging of the pelvis including uterus, ovaries, adnexal regions, and pelvic cul-de-sac. It was necessary to proceed with endovaginal exam following the transabdominal exam to visualize the uterus and ovaries. Color and duplex Doppler ultrasound was utilized to evaluate blood flow to the ovaries. COMPARISON:  No recent. FINDINGS: Uterus Measurements: 6.5 x 2.5 x 3.7 = volume: 31.7 mL. No focal abnormality identified. Endometrium Thickness: 5.2 mm.  No focal abnormality visualized. Right ovary Measurements: 2.6 x 2.0 x 1.7 cm = volume: 4.6 mL. Tiny  simple follicular cyst noted. Left ovary Measurements: 2.8 x 1.2 x 3.3 cm = volume: 5.7 mL. 2.1 cm hypoechoic flattened lesion noted the left ovary most likely complex cyst, possibly a collapsing hemorrhagic cyst. Pregnancy test suggested to exclude ectopic pregnancy. Follow-up exam can be obtained to demonstrate resolution. Pulsed Doppler evaluation of both ovaries demonstrates normal low-resistance arterial and venous waveforms. Other findings No abnormal free fluid. IMPRESSION: 1. 2.1 cm hypoechoic flat lesion noted within the left ovary most likely a complex cyst, possibly a collapsing hemorrhagic cyst. Pregnancy test to exclude ectopic pregnancy suggested. Short-interval follow up ultrasound in 6-12 weeks is recommended, preferably during the week following the patient's normal menses. 2.  Exam otherwise unremarkable. Electronically Signed   By: Maisie Fus  Register   On: 05/23/2018 12:58   [place] Art/ven Flow Abd Pelv Doppler  Result Date: 05/23/2018 CLINICAL DATA:  Pelvic pain. EXAM: TRANSABDOMINAL AND TRANSVAGINAL ULTRASOUND OF PELVIS DOPPLER ULTRASOUND OF  OVARIES TECHNIQUE: Both transabdominal and transvaginal ultrasound examinations of the pelvis were performed. Transabdominal technique was performed for global imaging of the pelvis including uterus, ovaries, adnexal regions, and pelvic cul-de-sac. It was necessary to proceed with endovaginal exam following the transabdominal exam to visualize the uterus and ovaries. Color and duplex Doppler ultrasound was utilized to evaluate blood flow to the ovaries. COMPARISON:  No recent. FINDINGS: Uterus Measurements: 6.5 x 2.5 x 3.7 = volume: 31.7 mL. No focal abnormality identified. Endometrium Thickness: 5.2 mm.  No focal abnormality visualized. Right ovary Measurements: 2.6 x 2.0 x 1.7 cm = volume: 4.6 mL. Tiny simple follicular cyst noted. Left ovary Measurements: 2.8 x 1.2 x 3.3 cm = volume: 5.7 mL. 2.1 cm hypoechoic flattened lesion noted the left ovary most  likely complex cyst, possibly a collapsing hemorrhagic cyst. Pregnancy test suggested to exclude ectopic pregnancy. Follow-up exam can be obtained to demonstrate resolution. Pulsed Doppler evaluation of both ovaries demonstrates normal low-resistance arterial and venous waveforms. Other findings No abnormal free fluid. IMPRESSION: 1. 2.1 cm hypoechoic flat lesion noted within the left ovary most likely a complex cyst, possibly a collapsing hemorrhagic cyst. Pregnancy test to exclude ectopic pregnancy suggested. Short-interval follow up ultrasound in 6-12 weeks is recommended, preferably during the week following the patient's normal menses. 2.  Exam otherwise unremarkable. Electronically Signed   By: Maisie Fus  Register   On: 05/23/2018 12:58    Procedures Procedures (including critical care time)  Medications Ordered in ED Medications  sodium chloride 0.9 % bolus 500 mL (500 mLs Intravenous New Bag/Given 05/23/18 1354)  sodium chloride 0.9 % bolus 1,000 mL (0 mLs Intravenous Stopped 05/23/18 1155)  fentaNYL (SUBLIMAZE) injection 50 mcg (50 mcg Intravenous Given 05/23/18 1045)  ondansetron (ZOFRAN) injection 4 mg (4 mg Intravenous Given 05/23/18 1045)  sodium chloride 0.9 % bolus 500 mL (500 mLs Intravenous New Bag/Given 05/23/18 1156)  sodium chloride 0.9 % bolus 500 mL (500 mLs Intravenous New Bag/Given 05/23/18 1238)  fentaNYL (SUBLIMAZE) injection 50 mcg (50 mcg Intravenous Given 05/23/18 1241)  ondansetron (ZOFRAN) injection 4 mg (4 mg Intravenous Given 05/23/18 1400)     Initial Impression / Assessment and Plan / ED Course  I have reviewed the triage vital signs and the nursing notes.  Pertinent labs & imaging results that were available during my care of the patient were reviewed by me and considered in my medical decision making (see chart for details).     32 year old female with a history of endometriosis who presents for abdominal pain.  Patient reports she normally has pelvic pain  during her menstrual cycles that she is controlled with Ultram.  She states however today her pain worsened her left lower quadrant when she developed nausea and vomiting.  She has not been able to take her home medications for this.  Patient vital signs are reassuring on presentation.  She initially had hypertension but the correct blood pressure cuff was placed and positioned & this resolved.  Patient is without any CVA tenderness.  UA without evidence of infection.  Do not suspect pyelonephritis, ureterolithiasis, or UTI.  Patient does have suprapubic and left lower quadrant tenderness.  Patient is without right lower quadrant tenderness palpation.  No rebound.  She is afebrile.  No leukocytosis.  Patient's Alvarado score places patient at low risk.  Do not feel patient needs CT scan to evaluate for appendicitis at this time.  Pregnancy test is negative.  Do not suspect ectopic.  Pelvic exam is with left  adnexal tenderness.  No cervical motion tenderness to make me concern for PID.  Pelvic ultrasound reveals a 2.1 cm hemorrhagic cyst.  On repeat abdominal exam patient is without any abdominal tenderness.  Her pain is improved.  She reports normal bowel movement since onset of her pain.  She is tolerating p.o. fluids.  Do not suspect obstruction.  No indication for CT scan at this time.  Will treat conservatively and have patient follow-up with OB/GYN.  She is to have a repeat ultrasound in 6-12 weeks.  Patient reviewed in [place] controlled substance database without any discrepancies found.  Will provide a short prescription for pain medication.  Patient advised not to travel taking his medications.  Repeat abdominal exam without any peritoneal signs.  Return precautions discussed. This includes but is not limited to fever, N/V despite zofran or pain that localizes to the right lower portion of the abdomen or worsens. Patient appears safe for discharge.   Final Clinical Impressions(s) / ED Diagnoses    Final diagnoses:  Pelvic pain in female    ED Discharge Orders         Ordered    ondansetron (ZOFRAN) 4 MG tablet  Every 6 hours     05/23/18 1411    traMADol (ULTRAM) 50 MG tablet  Every 6 hours PRN     05/23/18 1520           Princella Pellegrini 05/23/18 1620    Shaune Pollack, MD 05/24/18 [phone]

## 2018-05-23 NOTE — ED Notes (Signed)
Patient given discharge teaching and verbalized understanding. Patient was taken out of ED with a wheelchair. Patient was driven home by family member.

## 2018-05-23 NOTE — ED Notes (Signed)
Bed: WA14 Expected date:  Expected time:  Means of arrival:  Comments: ems 

## 2018-05-23 NOTE — ED Triage Notes (Signed)
Patient arrived by EMS from home. Pt c/o lower abdominal pain. Pt has mild vaginal discharge, pt is also menstruating.Pt has hx of endometriosis.   BP 96/84, HR 67, SpO2 100%, RR 22

## 2018-05-23 NOTE — ED Notes (Signed)
Patient made aware of need for urine sample. Patient unable to provide at this time.  

## 2018-05-23 NOTE — ED Notes (Signed)
Patient was up for discharge per MD assessment. Pt stated they were having generalized pain all over their body starting from their chest. Pt wants to be re-evaluated by MD. MD was notified. Pt will be discharged after re-evaluation by MD.

## 2018-05-23 NOTE — ED Notes (Signed)
US is at bedside.

## 2018-05-23 NOTE — Discharge Instructions (Addendum)
Please read and follow all provided instructions You have been seen today for your complaint of:  Your imaging: 2.1 cm hypoechoic flat lesion noted within the left ovary most likely a complex cyst, possibly a collapsing hemorrhagic cyst.  Your blood work was reassuring.  Vital signs: See below  Please see attached handout on Ovarian Cyst. Continue home pain medication. Take zofran as needed for nausea. Follow up with your obgyn. If you develop fever, N/V despite zofran or your pain localizes to the right lower portion of your abdomen, or worsens, please return for further evaluation.   Do not drink while taking ultram.   Abdominal Pain  Your exam might not show the exact reason you have abdominal pain. Since there are many different causes of abdominal pain, another checkup and more tests may be needed. It is very important to follow up for lasting (persistent) or worsening symptoms. A possible cause of abdominal pain in any person who still has his or her appendix is acute appendicitis. Appendicitis is often hard to diagnose. Normal blood tests, urine tests, ultrasound, and CT scans do not completely rule out early appendicitis or other causes of abdominal pain. Sometimes, only the changes that happen over time will allow appendicitis and other causes of abdominal pain to be determined. Other potential problems that may require surgery may also take time to become more apparent. Because of this, it is important that you follow all of the instructions below.   HOME CARE INSTRUCTIONS  Do not take laxatives unless directed by your caregiver. Rest as much as possible.  Do not eat solid food until your pain is gone: A diet of water, weak decaffeinated tea, broth or bouillon, gelatin, oral rehydration solutions (ORS), frozen ice pops, or ice chips may be helpful.  When pain is gone: Start a light diet (dry toast, crackers, applesauce, or white rice). Increase the diet slowly as long as it does not bother  you. Eat no dairy products (including cheese and eggs) and no spicy, fatty, fried, or high-fiber foods.  Use no alcohol, caffeine, or cigarettes.  Take your regular medicines unless your caregiver told you not to.  Take any prescribed medicine as directed.   SEEK IMMEDIATE MEDICAL CARE IF:  The pain does not go away.  You have a fever >101 that does not go down with medication. You keep throwing up (vomiting) or cannot drink liquids.  You have shaking chills (rigors) The pain becomes localized (Pain in the right side could possibly be appendicitis. In an adult, pain in the left lower portion of the abdomen could be colitis or diverticulitis). You pass bloody or black tarry stools.  There is bright red blood in the stool.  There is blood in your vomit. Your bowel movements stop (become blocked) or you cannot pass gas.  The constipation stays for more than 4 days.  You have bloody, frequent, or painful urination.  You have yellow discoloration in the skin or whites of the eyes.  Your stomach becomes bloated or bigger.  You have dizziness or fainting.  You have chest or back pain. You have rectal pain.  You do not seem to be getting better.  You have any questions or concerns.   Your vital signs today were: BP (!) 92/45 (BP Location: Right Arm)    Pulse 79    Temp 97.7 F (36.5 C) (Oral)    Resp 16    Ht 5\' 6"  (1.676 m)    Wt 54.4 kg  LMP 05/22/2018    SpO2 100%    BMI 19.37 kg/m  If your blood pressure (bp) was elevated above 135/85 this visit, please have this repeated by your doctor within one month.

## 2018-05-24 LAB — GC/CHLAMYDIA PROBE AMP (~~LOC~~) NOT AT ARMC
Chlamydia: NEGATIVE
NEISSERIA GONORRHEA: NEGATIVE

## 2018-06-21 ENCOUNTER — Encounter: Payer: Self-pay | Admitting: Obstetrics and Gynecology

## 2018-06-21 ENCOUNTER — Ambulatory Visit (INDEPENDENT_AMBULATORY_CARE_PROVIDER_SITE_OTHER): Payer: BLUE CROSS/BLUE SHIELD | Admitting: Obstetrics and Gynecology

## 2018-06-21 VITALS — BP 112/69 | HR 63 | Wt 120.8 lb

## 2018-06-21 DIAGNOSIS — R1031 Right lower quadrant pain: Secondary | ICD-10-CM

## 2018-06-21 DIAGNOSIS — G8929 Other chronic pain: Secondary | ICD-10-CM

## 2018-06-21 NOTE — Progress Notes (Signed)
32 yo P0 here for evaluation of RLQ pain. Patient states the pain has been present for the past 2 years. The pain is intermittent, not always associated with her cycle. She describes it as a sharp, stabbing pain. There are no alleviating or aggravating factors. Patient was seen by infertility specialist for evaluation of conception in the setting of endometriosis. Patient was not able to afford the recommended HSG. Patient reports recent return of her period which is not yet regular. Patient reports regular bowel movements  Past Medical History:  Diagnosis Date  . Bladder infection   . Endometriosis   . Migraine   . UTI (lower urinary tract infection)    Past Surgical History:  Procedure Laterality Date  . UMBILICAL HERNIA REPAIR     Family History  Problem Relation Age of Onset  . Hypertension Maternal Grandmother   . Stroke Maternal Grandmother   . Cancer Maternal Grandmother   . Cancer Paternal Grandmother    Social History   Tobacco Use  . Smoking status: Never Smoker  . Smokeless tobacco: Never Used  Substance Use Topics  . Alcohol use: No  . Drug use: No   ROS See pertinent in HPI  Blood pressure 112/69, pulse 63, weight 120 lb 12.8 oz (54.8 kg), last menstrual period 05/15/2018. GENERAL: Well-developed, well-nourished female in no acute distress.  ABDOMEN: Soft, nontender, nondistended. No organomegaly. EXTREMITIES: No cyanosis, clubbing, or edema, 2+ distal pulses.  [place] Transvaginal Non-ob  Result Date: 05/23/2018 CLINICAL DATA:  Pelvic pain. EXAM: TRANSABDOMINAL AND TRANSVAGINAL ULTRASOUND OF PELVIS DOPPLER ULTRASOUND OF OVARIES TECHNIQUE: Both transabdominal and transvaginal ultrasound examinations of the pelvis were performed. Transabdominal technique was performed for global imaging of the pelvis including uterus, ovaries, adnexal regions, and pelvic cul-de-sac. It was necessary to proceed with endovaginal exam following the transabdominal exam to visualize the  uterus and ovaries. Color and duplex Doppler ultrasound was utilized to evaluate blood flow to the ovaries. COMPARISON:  No recent. FINDINGS: Uterus Measurements: 6.5 x 2.5 x 3.7 = volume: 31.7 mL. No focal abnormality identified. Endometrium Thickness: 5.2 mm.  No focal abnormality visualized. Right ovary Measurements: 2.6 x 2.0 x 1.7 cm = volume: 4.6 mL. Tiny simple follicular cyst noted. Left ovary Measurements: 2.8 x 1.2 x 3.3 cm = volume: 5.7 mL. 2.1 cm hypoechoic flattened lesion noted the left ovary most likely complex cyst, possibly a collapsing hemorrhagic cyst. Pregnancy test suggested to exclude ectopic pregnancy. Follow-up exam can be obtained to demonstrate resolution. Pulsed Doppler evaluation of both ovaries demonstrates normal low-resistance arterial and venous waveforms. Other findings No abnormal free fluid. IMPRESSION: 1. 2.1 cm hypoechoic flat lesion noted within the left ovary most likely a complex cyst, possibly a collapsing hemorrhagic cyst. Pregnancy test to exclude ectopic pregnancy suggested. Short-interval follow up ultrasound in 6-12 weeks is recommended, preferably during the week following the patient's normal menses. 2.  Exam otherwise unremarkable. Electronically Signed   By: Maisie Fushomas  Register   On: 05/23/2018 12:58   [place] Pelvis Complete  Result Date: 05/23/2018 CLINICAL DATA:  Pelvic pain. EXAM: TRANSABDOMINAL AND TRANSVAGINAL ULTRASOUND OF PELVIS DOPPLER ULTRASOUND OF OVARIES TECHNIQUE: Both transabdominal and transvaginal ultrasound examinations of the pelvis were performed. Transabdominal technique was performed for global imaging of the pelvis including uterus, ovaries, adnexal regions, and pelvic cul-de-sac. It was necessary to proceed with endovaginal exam following the transabdominal exam to visualize the uterus and ovaries. Color and duplex Doppler ultrasound was utilized to evaluate blood flow to the ovaries. COMPARISON:  No recent. FINDINGS: Uterus Measurements: 6.5 x  2.5 x 3.7 = volume: 31.7 mL. No focal abnormality identified. Endometrium Thickness: 5.2 mm.  No focal abnormality visualized. Right ovary Measurements: 2.6 x 2.0 x 1.7 cm = volume: 4.6 mL. Tiny simple follicular cyst noted. Left ovary Measurements: 2.8 x 1.2 x 3.3 cm = volume: 5.7 mL. 2.1 cm hypoechoic flattened lesion noted the left ovary most likely complex cyst, possibly a collapsing hemorrhagic cyst. Pregnancy test suggested to exclude ectopic pregnancy. Follow-up exam can be obtained to demonstrate resolution. Pulsed Doppler evaluation of both ovaries demonstrates normal low-resistance arterial and venous waveforms. Other findings No abnormal free fluid. IMPRESSION: 1. 2.1 cm hypoechoic flat lesion noted within the left ovary most likely a complex cyst, possibly a collapsing hemorrhagic cyst. Pregnancy test to exclude ectopic pregnancy suggested. Short-interval follow up ultrasound in 6-12 weeks is recommended, preferably during the week following the patient's normal menses. 2.  Exam otherwise unremarkable. Electronically Signed   By: Maisie Fus  Register   On: 05/23/2018 12:58   [place] Art/ven Flow Abd Pelv Doppler  Result Date: 05/23/2018 CLINICAL DATA:  Pelvic pain. EXAM: TRANSABDOMINAL AND TRANSVAGINAL ULTRASOUND OF PELVIS DOPPLER ULTRASOUND OF OVARIES TECHNIQUE: Both transabdominal and transvaginal ultrasound examinations of the pelvis were performed. Transabdominal technique was performed for global imaging of the pelvis including uterus, ovaries, adnexal regions, and pelvic cul-de-sac. It was necessary to proceed with endovaginal exam following the transabdominal exam to visualize the uterus and ovaries. Color and duplex Doppler ultrasound was utilized to evaluate blood flow to the ovaries. COMPARISON:  No recent. FINDINGS: Uterus Measurements: 6.5 x 2.5 x 3.7 = volume: 31.7 mL. No focal abnormality identified. Endometrium Thickness: 5.2 mm.  No focal abnormality visualized. Right ovary Measurements: 2.6  x 2.0 x 1.7 cm = volume: 4.6 mL. Tiny simple follicular cyst noted. Left ovary Measurements: 2.8 x 1.2 x 3.3 cm = volume: 5.7 mL. 2.1 cm hypoechoic flattened lesion noted the left ovary most likely complex cyst, possibly a collapsing hemorrhagic cyst. Pregnancy test suggested to exclude ectopic pregnancy. Follow-up exam can be obtained to demonstrate resolution. Pulsed Doppler evaluation of both ovaries demonstrates normal low-resistance arterial and venous waveforms. Other findings No abnormal free fluid. IMPRESSION: 1. 2.1 cm hypoechoic flat lesion noted within the left ovary most likely a complex cyst, possibly a collapsing hemorrhagic cyst. Pregnancy test to exclude ectopic pregnancy suggested. Short-interval follow up ultrasound in 6-12 weeks is recommended, preferably during the week following the patient's normal menses. 2.  Exam otherwise unremarkable. Electronically Signed   By: Maisie Fus  Register   On: 05/23/2018 12:58     A/P 32 yo P0 with RLQ pain - reviewed ultrasound with patient - Discussed possibility of scar tissue/adhesions from endometriosis - Discussed following up with PCP for possible GI evaluation - Discussed following up with [facility] for further eval of pain if negative GI evaluation for possible surgical intervention sparring her infertility - Patient with normal mammogram 06/2017- needs to scheduled for this year

## 2018-07-25 ENCOUNTER — Ambulatory Visit (INDEPENDENT_AMBULATORY_CARE_PROVIDER_SITE_OTHER): Payer: BLUE CROSS/BLUE SHIELD

## 2018-07-25 DIAGNOSIS — R8290 Unspecified abnormal findings in urine: Secondary | ICD-10-CM

## 2018-07-25 LAB — POCT URINALYSIS DIP (DEVICE)
Bilirubin Urine: NEGATIVE
GLUCOSE, UA: 100 mg/dL — AB
Hgb urine dipstick: NEGATIVE
Nitrite: POSITIVE — AB
PH: 5 (ref 5.0–8.0)
PROTEIN: 30 mg/dL — AB
UROBILINOGEN UA: 2 mg/dL — AB (ref 0.0–1.0)

## 2018-07-25 MED ORDER — PHENAZOPYRIDINE HCL 200 MG PO TABS: 200.0000 mg | ORAL_TABLET | Freq: Three times a day (TID) | ORAL | 0 refills | Status: DC | PRN

## 2018-07-25 MED ORDER — NITROFURANTOIN MONOHYD MACRO 100 MG PO CAPS: 100.0000 mg | ORAL_CAPSULE | Freq: Two times a day (BID) | ORAL | 0 refills | Status: DC

## 2018-07-25 NOTE — Progress Notes (Signed)
ATTESTATION OF SUPERVISION OF RN: Evaluation and management procedures were performed by the RN under my supervision and collaboration. I have reviewed the nursing note and chart and agree with the management and plan for this patient.  Samantha Weinhold, CNM  

## 2018-07-25 NOTE — Progress Notes (Signed)
Pt here today for possible UTI.  Pt reports urinary frequency and some mild pain with urination.  Pt reports starting to AZO's for the past couple days- observed pt's urine orange colored.  Pt advised that per protocol I can e-prescribe Macrobid 100 mg po bid for 5 days and Pyridium 200 mg po tid for 2 days and send her urine for culture.  I also informed pt that if her tx needs to change after culture results will call her.  Pt verbalized understanding with no further questions.

## 2018-07-27 ENCOUNTER — Telehealth: Payer: Self-pay | Admitting: General Practice

## 2018-07-27 DIAGNOSIS — N3000 Acute cystitis without hematuria: Secondary | ICD-10-CM

## 2018-07-27 LAB — URINE CULTURE

## 2018-07-27 MED ORDER — SULFAMETHOXAZOLE-TRIMETHOPRIM 800-160 MG PO TABS: 1.0000 | ORAL_TABLET | Freq: Two times a day (BID) | ORAL | 0 refills | Status: AC

## 2018-07-27 NOTE — Telephone Encounter (Signed)
-----   Message from Calvert Cantor, [place] sent at 07/27/2018 11:52 AM EST ----- Patient was given [facility] 07/25/18. Please switch to Bactrim due to susceptibility on urine culture. ----- Message ----- From: Interface, [facility] Lab Results In Sent: 07/27/2018  11:37 AM EST To: Calvert Cantor, CNM

## 2018-07-27 NOTE — Telephone Encounter (Signed)
Called Jane [name] to confirm bactrim BID x 5 days. Called & informed patient of medication change. Patient verbalized understanding & had no questions.

## 2018-08-23 ENCOUNTER — Telehealth: Payer: Self-pay | Admitting: General Practice

## 2018-08-23 DIAGNOSIS — N926 Irregular menstruation, unspecified: Secondary | ICD-10-CM

## 2018-08-23 MED ORDER — IBUPROFEN 800 MG PO TABS: 800.0000 mg | ORAL_TABLET | Freq: Three times a day (TID) | ORAL | 0 refills | Status: DC | PRN

## 2018-08-23 NOTE — Telephone Encounter (Signed)
Patient called and left message on nurse voicemail line stating she needs a refill on ibuprofen 800mg . Per Dr Jolayne Panther, patient can have refill and should ask about patient seeing infertility specialist. Called patient & informed her of refill and asked about seeing the specialist. Patient verbalized understanding & states she has seen him and the next step is the dye test and she is just trying to save up her money and pay for some things before the test is done. Patient had no questions.

## 2018-09-19 ENCOUNTER — Ambulatory Visit (INDEPENDENT_AMBULATORY_CARE_PROVIDER_SITE_OTHER): Payer: BLUE CROSS/BLUE SHIELD

## 2018-09-19 VITALS — Wt 122.3 lb

## 2018-09-19 DIAGNOSIS — Z113 Encounter for screening for infections with a predominantly sexual mode of transmission: Secondary | ICD-10-CM | POA: Diagnosis not present

## 2018-09-19 DIAGNOSIS — N898 Other specified noninflammatory disorders of vagina: Secondary | ICD-10-CM

## 2018-09-19 NOTE — Progress Notes (Signed)
Pt here today for vaginal discharge.  Pt reports having white discharge with some itchiness.  I explained to the pt how to obtain a self swab and that we will call her with abnormal results within 24-48 hours.  Pt stated understanding with no further questions.

## 2018-09-21 LAB — CERVICOVAGINAL ANCILLARY ONLY
BACTERIAL VAGINITIS: NEGATIVE
Candida vaginitis: NEGATIVE
Chlamydia: NEGATIVE
NEISSERIA GONORRHEA: NEGATIVE
TRICH (WINDOWPATH): NEGATIVE

## 2018-09-23 ENCOUNTER — Other Ambulatory Visit: Payer: Self-pay | Admitting: Obstetrics and Gynecology

## 2018-09-23 DIAGNOSIS — N926 Irregular menstruation, unspecified: Secondary | ICD-10-CM

## 2018-09-25 ENCOUNTER — Telehealth: Payer: Self-pay | Admitting: *Deleted

## 2018-09-25 MED ORDER — IBUPROFEN 600 MG PO TABS: 600.0000 mg | ORAL_TABLET | Freq: Four times a day (QID) | ORAL | 3 refills | Status: DC | PRN

## 2018-09-25 NOTE — Telephone Encounter (Signed)
Jane [name] called and left a message she is calling for her results and to get a refill of her Ibuprofen 800mg  because she is almost out and with the pandemic just wants to be prepared./    I called and notified Jane [name] that her wet prep from 09/19/18 was all negagtive. She also asked about her refill and I notified I will check with provider and send in refill. She voices understanding.

## 2018-09-25 NOTE — Addendum Note (Signed)
Addended by: Catalina Antigua on: 09/25/2018 10:54 AM   Modules accepted: Orders

## 2018-10-03 NOTE — Progress Notes (Signed)
Chart reviewed for nurse visit. Agree with plan of care.   Breyana Follansbee Lauren, DO 10/03/2018 4:49 PM  

## 2018-10-15 ENCOUNTER — Other Ambulatory Visit: Payer: Self-pay

## 2018-10-15 ENCOUNTER — Emergency Department (HOSPITAL_COMMUNITY)
Admission: EM | Admit: 2018-10-15 | Discharge: 2018-10-15 | Disposition: A | Discharge status: Home / Self Care | Payer: BLUE CROSS/BLUE SHIELD | Attending: Emergency Medicine | Admitting: Emergency Medicine

## 2018-10-15 DIAGNOSIS — N946 Dysmenorrhea, unspecified: Secondary | ICD-10-CM

## 2018-10-15 DIAGNOSIS — R1030 Lower abdominal pain, unspecified: Secondary | ICD-10-CM | POA: Diagnosis not present

## 2018-10-15 LAB — POC URINE PREG, ED: Preg Test, Ur: NEGATIVE

## 2018-10-15 MED ORDER — ONDANSETRON 4 MG PO TBDP
8.0000 mg | ORAL_TABLET | Freq: Once | ORAL | Status: AC
  Administered 2018-10-15: 8 mg via ORAL
  Filled 2018-10-15: qty 2

## 2018-10-15 MED ORDER — IBUPROFEN 800 MG PO TABS: 800.0000 mg | ORAL_TABLET | Freq: Three times a day (TID) | ORAL | 1 refills | Status: DC

## 2018-10-15 MED ORDER — ONDANSETRON 8 MG PO TBDP: 8.0000 mg | ORAL_TABLET | Freq: Three times a day (TID) | ORAL | 0 refills | Status: DC | PRN

## 2018-10-15 NOTE — ED Provider Notes (Signed)
[facility] EMERGENCY DEPARTMENT Provider Note   CSN: [id number] Arrival date & time: 10/15/18  8502    History   Chief Complaint Chief Complaint  Patient presents with  . Abdominal Pain    HPI Jane [name] is a 33 y.o. female.     HPI   Jane [name] is a 33 y.o. female, with a history of endometriosis, presenting to the ED with suprapubic abdominal pain beginning today.  Pain is cramping, severe, radiating toward the umbilicus.  She states this pain is exactly the same pain she experiences with her endometriosis upon the start of her menstrual cycle, which began today.  She has been prescribed ibuprofen by OB/GYN.  She took a dose of this this morning on an empty stomach, became nauseous, and vomited within 10 minutes; the ibuprofen was noted to be in the vomit.  She also states nausea and vomiting typically accompany her menstrual pain. Patient states she is trying to conceive and is not on any hormonal therapy. Denies fever/chills, chest pain, shortness of breath, abnormal vaginal discharge prior to her menstrual cycle, urinary symptoms, hematochezia/melena, or any other complaints.     Past Medical History:  Diagnosis Date  . Bladder infection   . Endometriosis   . Migraine   . UTI (lower urinary tract infection)     Patient Active Problem List   Diagnosis Date Noted  . Vitamin D deficiency 09/09/2014  . Dental cavities 10/09/2013  . Abdominal bloating 10/09/2013  . Right shoulder pain 10/09/2013  . 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 Other and unspecified ovarian cyst 01/09/2011  . Cocaine abuse, unspecified 01/09/2011    Past Surgical History:  Procedure Laterality Date  . UMBILICAL HERNIA REPAIR       OB History   No obstetric history on file.      Home Medications    Prior to Admission medications   Medication Sig Start Date End Date Taking? Authorizing Provider  ibuprofen  (ADVIL,MOTRIN) 600 MG tablet Take 1 tablet (600 mg total) by mouth every 6 (six) hours as needed. 09/25/18   Constant, Peggy, MD  ibuprofen (ADVIL,MOTRIN) 800 MG tablet Take 1 tablet (800 mg total) by mouth every 8 (eight) hours as needed. 08/23/18   Constant, Peggy, MD  ondansetron (ZOFRAN ODT) 8 MG disintegrating tablet Take 1 tablet (8 mg total) by mouth every 8 (eight) hours as needed for nausea or vomiting. 10/15/18   Eiley Mcginnity C, PA-C  oxyCODONE-acetaminophen (PERCOCET) 5-325 MG tablet Take 1-2 tablets by mouth every 4 (four) hours as needed. 10/04/16   Charlynne Pander, MD  phenazopyridine (PYRIDIUM) 200 MG tablet Take 1 tablet (200 mg total) by mouth 3 (three) times daily as needed for pain. 07/25/18   Calvert Cantor, CNM  pseudoephedrine-acetaminophen (TYLENOL SINUS) 30-500 MG TABS Take 2 tablets by mouth every 4 (four) hours as needed (pain/migraine).     [provider]  sodium chloride (OCEAN) 0.65 % SOLN nasal spray Place 1 spray into both nostrils as needed for congestion.    [provider]    Family History Family History  Problem Relation Age of Onset  . Hypertension Maternal Grandmother   . Stroke Maternal Grandmother   . Cancer Maternal Grandmother   . Cancer Paternal Grandmother     Social History Social History   Tobacco Use  . Smoking status: Never Smoker  . Smokeless tobacco: Never Used  Substance Use Topics  . Alcohol use: No  .  Drug use: No     Allergies   Toradol [ketorolac tromethamine]; Clindamycin/lincomycin; and Vicodin [hydrocodone-acetaminophen]   Review of Systems Review of Systems  Constitutional: Negative for chills and fever.  Respiratory: Negative for shortness of breath.   Cardiovascular: Negative for chest pain.  Gastrointestinal: Positive for abdominal pain, nausea and vomiting. Negative for blood in stool and diarrhea.  Genitourinary: Negative for dysuria, flank pain and vaginal discharge.  Musculoskeletal:  Negative for back pain.  Neurological: Negative for weakness and numbness.  All other systems reviewed and are negative.    Physical Exam Updated Vital Signs BP (!) 97/50 (BP Location: Right Arm)   Pulse 73   Temp 98 F (36.7 C) (Oral)   Resp 14   Ht  (1.676 m)   Wt 54.4 kg   SpO2 99%   BMI 19.37 kg/m   Physical Exam Vitals signs and nursing note reviewed.  Constitutional:      General: She is not in acute distress.    Appearance: She is well-developed. She is not diaphoretic.  HENT:     Head: Normocephalic and atraumatic.     Mouth/Throat:     Mouth: Mucous membranes are moist.     Pharynx: Oropharynx is clear.  Eyes:     Conjunctiva/sclera: Conjunctivae normal.  Neck:     Musculoskeletal: Neck supple.  Cardiovascular:     Rate and Rhythm: Normal rate and regular rhythm.     Pulses: Normal pulses.     Heart sounds: Normal heart sounds.     Comments: Tactile temperature in the extremities appropriate and equal bilaterally. Pulmonary:     Effort: Pulmonary effort is normal. No respiratory distress.     Breath sounds: Normal breath sounds.  Abdominal:     Palpations: Abdomen is soft.     Tenderness: There is abdominal tenderness in the suprapubic area. There is no guarding.  Musculoskeletal:     Right lower leg: No edema.     Left lower leg: No edema.  Lymphadenopathy:     Cervical: No cervical adenopathy.  Skin:    General: Skin is warm and dry.  Neurological:     Mental Status: She is alert.  Psychiatric:        Mood and Affect: Mood and affect normal.        Speech: Speech normal.        Behavior: Behavior normal.      ED Treatments / Results  Labs (all labs ordered are listed, but only abnormal results are displayed) Labs Reviewed  POC URINE PREG, ED    EKG None  Radiology No results found.  Procedures Procedures (including critical care time)  Medications Ordered in ED Medications  ondansetron (ZOFRAN-ODT) disintegrating tablet 8  mg (8 mg Oral Given 10/15/18 1018)     Initial Impression / Assessment and Plan / ED Course  I have reviewed the triage vital signs and the nursing notes.  Pertinent labs & imaging results that were available during my care of the patient were reviewed by me and considered in my medical decision making (see chart for details).  Clinical Course as of Oct 15 1050  Sun Oct 15, 2018  1010 Patient notes this blood pressure reading is normal for her, especially while she is on her menstrual cycle. She denies dizziness, chest pain, shortness of breath, increased vaginal bleeding, or any other related complaints not already discussed.  BP(!): 97/50 [SJ]    Clinical Course User Index [SJ] Siah Steely C,  PA-C       Patient presents with pain in the suprapubic region of the abdomen.  She states this pain is typical for her menstrual cycles in the setting of endometriosis. Patient is nontoxic appearing, afebrile, not tachycardic, not tachypneic, not hypotensive, excellent SPO2 on room air, and is in no apparent distress.  Patient received 100 mcg of fentanyl by EMS prior to arrival.  Recommend OB/GYN follow-up, even if it is via telemedicine or phone visit.  Since this is a chronic issue, I do not think prescribing narcotics is appropriate. The patient was given instructions for home care as well as return precautions. Patient voices understanding of these instructions.    Final Clinical Impressions(s) / ED Diagnoses   Final diagnoses:  Lower abdominal pain  Dysmenorrhea    ED Discharge Orders         Ordered    ondansetron (ZOFRAN ODT) 8 MG disintegrating tablet  Every 8 hours PRN     10/15/18 1012           Anselm Pancoast, PA-C 10/15/18 1052    Margarita Grizzle, MD 10/15/18 1427

## 2018-10-15 NOTE — ED Notes (Signed)
Wants rx for 800 motrin

## 2018-10-15 NOTE — ED Triage Notes (Signed)
PT reports she is here for pain management . Pt reports pain cause by endometriosis . Pt reports taking Advil on empty stomach and vomited. EMS gave Fentanyl IV for pain. Pain started at 10/10 after Fentanyl  Pain was 3/10.

## 2018-10-15 NOTE — Discharge Instructions (Signed)
Pregnancy test was negative today. Antiinflammatory medications: Take 600 mg of ibuprofen every 6 hours (or 800 mg every 8 hours) or 440 mg (over the counter dose) to 500 mg (prescription dose) of naproxen every 12 hours for the next 3 days. After this time, these medications may be used as needed for pain. Take these medications with food to avoid upset stomach. Choose only one of these medications, do not take them together. Acetaminophen (generic for Tylenol): Should you continue to have additional pain while taking the ibuprofen or naproxen, you may add in acetaminophen as needed. Your daily total maximum amount of acetaminophen from all sources should be limited to 4000mg /day for persons without liver problems, or 2000mg /day for those with liver problems. Nausea/vomiting: Use the ondansetron (generic for Zofran) for nausea or vomiting.  This medication may not prevent all vomiting or nausea, but can help facilitate better hydration. Things that can help with nausea/vomiting also include peppermint/menthol candies, vitamin B12, and ginger. Follow-up: Follow-up with OB/GYN for any further management of this issue, even if this means a telemedicine or phone visit. Return: For worsening symptoms, proceed to [facility] at [facility].

## 2018-10-15 NOTE — ED Triage Notes (Signed)
PT reports she does not want any Damn pelvic or [place].

## 2018-12-26 ENCOUNTER — Other Ambulatory Visit: Payer: Self-pay | Admitting: Family Medicine

## 2018-12-29 ENCOUNTER — Other Ambulatory Visit: Payer: Self-pay | Admitting: Family Medicine

## 2018-12-29 DIAGNOSIS — R109 Unspecified abdominal pain: Secondary | ICD-10-CM

## 2019-01-16 ENCOUNTER — Other Ambulatory Visit: Payer: BLUE CROSS/BLUE SHIELD

## 2019-01-23 ENCOUNTER — Ambulatory Visit
Admission: RE | Admit: 2019-01-23 | Discharge: 2019-01-23 | Disposition: A | Discharge status: Home / Self Care | Payer: BC Managed Care – PPO | Source: Ambulatory Visit | Attending: Family Medicine | Admitting: Family Medicine

## 2019-01-23 DIAGNOSIS — R109 Unspecified abdominal pain: Secondary | ICD-10-CM

## 2019-01-23 MED ORDER — IOPAMIDOL (ISOVUE-300) INJECTION 61%
100.0000 mL | Freq: Once | INTRAVENOUS | Status: AC | PRN
  Administered 2019-01-23: 100 mL via INTRAVENOUS

## 2019-02-13 ENCOUNTER — Encounter: Payer: Self-pay | Admitting: Emergency Medicine

## 2019-02-13 NOTE — Progress Notes (Signed)
TELEHEALTH VISIT  Referring Provider: Leilani Ableeese, Betti, MD Primary Care Physician:  Leilani Ableeese, Betti, MD   Tele-visit due to COVID-19 pandemic Patient requested visit virtually, consented to the virtual encounter via video enabled telemedicine application Contact made at: 08:30 02/14/19 Patient verified by name and date of birth Location of patient: Home Location provider: [facility] Names of persons participating: Me, patient, Deneise LeverDesiree Brown CMA Time spent on telehealth visit: 28 minutes I discussed the limitations of evaluation and management by telemedicine. The patient expressed understanding and agreed to proceed.  Reason for Consultation: Abdominal pain   IMPRESSION:  Right lower quadrant abdominal pain Abnormal CT of the colon  Reflux with recent dysphonia and hoarseness Patient concern about endometriosis  3.2 x 1.9 cm soft tissue density need on recent CT scan. Differential diagnosis includes endometriosis, chronic appendicitis, appendiceal neoplasm, and cecal carcinoma.  Reviewed the diagnosis of reflux with LPR causing hoarseness and dysphonia. She is resistant to any medications. Recommend a trial of PPI prior to proceeding with endoscopy.   PLAN: Pantoprazole 40 mg every morning x 10 weeks Avoid all NSAIDs Colonoscopy to evaluate her abdominal pain and the abnormal CT scan  Please see the "Patient Instructions" section for addition details about the plan.  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 Pecola Leisureeese for further evaluation of an abnormal CT scan.  The history is obtained through the patient, review of her electronic health record, and records provided by Dr. Pecola Leisureeese. The patient was not aware of the results of the CT at the time of my consultation.   The patient has a history of gastroesophageal reflux disease and endometriosis diagnosed in 2005 requiring umbilical hernia repair.    She 1 1/2 year history of a sharp pain  in her lower abdomen and pelvis primarily on the right.  There is associated bloating.  Worse during menses. No associated change in bowel habits. No blood or mucous in the stool.  She is concerned that it is endometriosis but evaluation in OB/GYN did not reveal the source. Pelvic ultrasound in November 2019.  They offered low-dose hormone therapy but the patient declined.   Dr. Madilyn Hookees recommended ibuprofen 800 mg 4 times daily for 30 days, omega-3, probiotic, and Reglan.  But treatment provided no relief. Lost 15 pounds in 2018. She has lost a further 15-20 sounds then.   She also reports dysphonia since 2008 worsened over the last few months, also worse around menses.  Heartburn noted in 2004.  Frequent hoarseness.  She is unable to reach certain pitches when singing.  She feels that there octaves that she can no longer reach and the overall sound is different.  She denies sore throat or cough. Nexium prescribed 15 years ago but she was not interested in taking medication. She doesn't feel like it effects her every day.  Under evaluation by ENT.  CT abd/pelvis with contrast 01/23/19 showed an ill-defined soft tissue density is seen the right lower quadrant involving the tip of the cecum. This measures 3.2 x 1.9 cm, and is new since previous study 10/03/16.  Labs from 06/29/2018 show a normal CBC with a white count 6.2, hemoglobin 13.2, MCV 92.4, RDW 12.2, platelets 195.  Mother had an ulcer. Sister with endometriosis. No known family history of colon cancer or polyps. No family history of uterine/endometrial cancer, pancreatic cancer or gastric/stomach cancer.  The patient has been begging for a "laparotomy" because she is concerned this is due to endometriosis. "  I'm ready to cut myself open."  Taking Ibuprofen 800mg  for menstrual cramps that are "debilitating." No other NSAIDs.   Pelvic ultrasound 06/02/18: 2.1 cm hypoechoic flat lesion within the left ovary thought to be a complex cyst, possibly a  callapsing hemorrhagic cyst. Exam otherwise normal.   Past Medical History:  Diagnosis Date  . Bladder infection   . Endometriosis   . GERD (gastroesophageal reflux disease)   . Migraine   . UTI (lower urinary tract infection)     Past Surgical History:  Procedure Laterality Date  . UMBILICAL HERNIA REPAIR      Current Outpatient Medications  Medication Sig Dispense Refill  . ibuprofen (ADVIL,MOTRIN) 800 MG tablet Take 1 tablet (800 mg total) by mouth 3 (three) times daily. 30 tablet 1  . ondansetron (ZOFRAN ODT) 8 MG disintegrating tablet Take 1 tablet (8 mg total) by mouth every 8 (eight) hours as needed for nausea or vomiting. 20 tablet 0   No current facility-administered medications for this visit.     Allergies as of 02/14/2019 - Review Complete 02/14/2019  Allergen Reaction Noted  . Toradol [ketorolac tromethamine] Anxiety 05/10/2012  . Clindamycin/lincomycin Hives 10/03/2016  . Vicodin [hydrocodone-acetaminophen] Nausea And Vomiting 05/10/2012    Family History  Problem Relation Age of Onset  . Hypertension Maternal Grandmother   . Stroke Maternal Grandmother   . Cancer Maternal Grandmother   . Cancer Paternal Grandmother   . Endometriosis Sister     Social History   Socioeconomic History  . Marital status: Married    Spouse name: Not on file  . Number of children: Not on file  . Years of education: Not on file  . Highest education level: Not on file  Occupational History  . Not on file  Social Needs  . Financial resource strain: Not on file  . Food insecurity    Worry: Not on file    Inability: Not on file  . Transportation needs    Medical: Not on file    Non-medical: Not on file  Tobacco Use  . Smoking status: Never Smoker  . Smokeless tobacco: Never Used  Substance and Sexual Activity  . Alcohol use: No  . Drug use: No  . Sexual activity: Yes    Birth control/protection: Injection  Lifestyle  . Physical activity    Days per week: Not on  file    Minutes per session: Not on file  . Stress: Not on file  Relationships  . Social Herbalist on phone: Not on file    Gets together: Not on file    Attends religious service: Not on file    Active member of club or organization: Not on file    Attends meetings of clubs or organizations: Not on file    Relationship status: Not on file  . Intimate partner violence    Fear of current or ex partner: Not on file    Emotionally abused: Not on file    Physically abused: Not on file    Forced sexual activity: Not on file  Other Topics Concern  . Not on file  Social History Narrative  . Not on file    Review of Systems: ALL ROS discussed and all others negative except listed in HPI. Intermittent right shoulder pain.  Physical Exam: Complete physical exam not performed due to the limits inherent in a telehealth encounter.  General: Awake, alert, and oriented, and well communicative. In no acute distress.  HEENT: EOMI,  non-icteric sclera, NCAT, MMM  Neck: Normal movement of head and neck  Pulm: No labored breathing, speaking in full sentences without conversational dyspnea  Derm: No apparent lesions or bruising in visible field  MS: Moves all visible extremities without noticeable abnormality  Psych: Pleasant, cooperative, normal speech, normal affect and normal insight Neuro: Alert and appropriate   Jermia Rigsby L. Orvan FalconerBeavers, MD, MPH [facility] 02/14/2019, 8:43 AM

## 2019-02-14 ENCOUNTER — Encounter: Payer: Self-pay | Admitting: Gastroenterology

## 2019-02-14 ENCOUNTER — Ambulatory Visit (INDEPENDENT_AMBULATORY_CARE_PROVIDER_SITE_OTHER): Payer: BLUE CROSS/BLUE SHIELD | Admitting: Gastroenterology

## 2019-02-14 DIAGNOSIS — K219 Gastro-esophageal reflux disease without esophagitis: Secondary | ICD-10-CM

## 2019-02-14 DIAGNOSIS — R490 Dysphonia: Secondary | ICD-10-CM

## 2019-02-14 DIAGNOSIS — R1031 Right lower quadrant pain: Secondary | ICD-10-CM

## 2019-02-14 DIAGNOSIS — G8929 Other chronic pain: Secondary | ICD-10-CM

## 2019-02-14 DIAGNOSIS — R933 Abnormal findings on diagnostic imaging of other parts of digestive tract: Secondary | ICD-10-CM

## 2019-02-14 MED ORDER — NA SULFATE-K SULFATE-MG SULF 17.5-3.13-1.6 GM/177ML PO SOLN: 1.0000 | ORAL | 0 refills | Status: DC

## 2019-02-14 MED ORDER — PANTOPRAZOLE SODIUM 40 MG PO TBEC: 40.0000 mg | DELAYED_RELEASE_TABLET | Freq: Every day | ORAL | 2 refills | Status: DC

## 2019-02-14 NOTE — Patient Instructions (Addendum)
I recommended that you start taking Protonix 40 mg every morning 30 minutes before your morning meal. Avoid any dietary triggers. Avoid spicy and acidic foods. Limit your intake of coffee, tea, alcohol, and carbonated drinks. Work to maintain a healthy weight. Keep the head of the bed elevated with blocks if you are having any nighttime symptoms. Stay upright for 2 hours after eating. Avoid meals and snacks three to four hours before bedtime.  I have recommended a colonoscopy to evaluate your abdominal pain and abnormal CT scan.  Thank you for your patience with me and our technology today!  I look forward to meeting you in person in the future.

## 2019-02-19 ENCOUNTER — Telehealth: Payer: Self-pay | Admitting: Gastroenterology

## 2019-02-19 NOTE — Telephone Encounter (Signed)
Patient called with concerns re: prep for colonoscopy 02/21/19. Ate several walnuts and had beans both yesterday and today. I encouraged her to follow the full prep tomorrow, drinking even more water than recommended. She will call tomorrow with any concerns if the prep is not working.

## 2019-02-20 ENCOUNTER — Telehealth: Payer: Self-pay | Admitting: Gastroenterology

## 2019-02-20 NOTE — Telephone Encounter (Signed)

## 2019-02-21 ENCOUNTER — Encounter: Payer: Self-pay | Admitting: Gastroenterology

## 2019-02-21 ENCOUNTER — Other Ambulatory Visit: Payer: Self-pay

## 2019-02-21 ENCOUNTER — Ambulatory Visit (AMBULATORY_SURGERY_CENTER): Payer: BC Managed Care – PPO | Admitting: Gastroenterology

## 2019-02-21 VITALS — BP 119/59 | HR 78 | Temp 99.3°F | Resp 16 | Ht 66.0 in | Wt 120.0 lb

## 2019-02-21 DIAGNOSIS — R933 Abnormal findings on diagnostic imaging of other parts of digestive tract: Secondary | ICD-10-CM

## 2019-02-21 MED ORDER — SODIUM CHLORIDE 0.9 % IV SOLN: 500.0000 mL | Freq: Once | INTRAVENOUS | Status: DC

## 2019-02-21 NOTE — Op Note (Signed)
[facility] Patient Name: Jane CohnVanessa Ferguson-fuller Procedure Date: 02/21/2019 11:37 AM MRN: [id number] Endoscopist: Tressia DanasKimberly Kenedi Cilia MD, MD Age: 33 Referring MD:  Date of Birth: [birth year] Gender: Female Account #: [id number] Procedure:                Colonoscopy Indications:              Abnormal CT of the GI tract                           - CT abd/pelvis with contrast 01/23/19 showed an                            ill-defined soft tissue density is seen the right                            lower quadrant involving the tip of the cecum. This                            measures 3.2 x 1.9 cm, and is new since previous                            study 10/03/16.                           - No known family history of colon cancer or polyps. Medicines:                See the Anesthesia note for documentation of the                            administered medications Procedure:                Pre-Anesthesia Assessment:                           - Prior to the procedure, a History and Physical                            was performed, and patient medications and                            allergies were reviewed. The patient's tolerance of                            previous anesthesia was also reviewed. The risks                            and benefits of the procedure and the sedation                            options and risks were discussed with the patient.                            All questions were answered, and informed consent  was obtained. Prior Anticoagulants: The patient has                            taken no previous anticoagulant or antiplatelet                            agents. ASA Grade Assessment: II - A patient with                            mild systemic disease. After reviewing the risks                            and benefits, the patient was deemed in                            satisfactory condition to undergo the procedure.                        After obtaining informed consent, the colonoscope                            was passed under direct vision. Throughout the                            procedure, the patient's blood pressure, pulse, and                            oxygen saturations were monitored continuously. The                            Colonoscope was introduced through the anus and                            advanced to the the terminal ileum, with                            identification of the appendiceal orifice and IC                            valve. A second forward view of the right colon was                            performed. The terminal ileum, ileocecal valve,                            appendiceal orifice, and rectum were photographed. Scope In: 11:48:57 AM Scope Out: 12:03:56 PM Scope Withdrawal Time: 0 hours 11 minutes 34 seconds  Total Procedure Duration: 0 hours 14 minutes 59 seconds  Findings:                 The perianal and digital rectal examinations were                            normal.  The colon (entire examined portion) appeared normal.                           The appendiceal orifice appeared normal. Tunneled                            biopsies were taken with a cold forceps for                            histology. Estimated blood loss was minimal.                           A patchy area of mucosa in the distal ileum was                            mildly erythematous. Biopsies were taken with a                            cold forceps for histology. Estimated blood loss                            was minimal.                           The exam was otherwise without abnormality on                            direct and retroflexion views. Complications:            No immediate complications. Estimated blood loss:                            Minimal. Estimated Blood Loss:     Estimated blood loss was minimal. Impression:               - The  entire examined colon is normal.                           - The appendiceal orifice is normal. Biopsied.                           - Erythematous mucosa in the distal ileum. Biopsied.                           - The examination was otherwise normal on direct                            and retroflexion views. Recommendation:           - Patient has a contact number available for                            emergencies. The signs and symptoms of potential                            delayed complications were discussed  with the                            patient. Return to normal activities tomorrow.                            Written discharge instructions were provided to the                            patient.                           - Resume previous diet today.                           - Continue present medications.                           - Avoid all NSAIDs- as these may contribute to the                            small bowel findings.                           - Await pathology results. May need to consider                            endoscopic ultrasound of the appendiceal orifice if                            the biopsies are non-diagnostic.                           - Start colon cancer screen at age 33. Tressia DanasKimberly Ernesteen Mihalic MD, MD 02/21/2019 12:20:13 PM This report has been signed electronically.

## 2019-02-21 NOTE — Progress Notes (Signed)
Called to room to assist during endoscopic procedure.  Patient ID and intended procedure confirmed with present staff. Received instructions for my participation in the procedure from the performing physician.  

## 2019-02-21 NOTE — Patient Instructions (Signed)
Continue present medications. Await pathology results. Start colon cancer screen at age 33. Avoid all NSAIDs.       YOU HAD AN ENDOSCOPIC PROCEDURE TODAY AT [facility]:   Refer to the procedure report that was given to you for any specific questions about what was found during the examination.  If the procedure report does not answer your questions, please call your gastroenterologist to clarify.  If you requested that your care partner not be given the details of your procedure findings, then the procedure report has been included in a sealed envelope for you to review at your convenience later.  YOU SHOULD EXPECT: Some feelings of bloating in the abdomen. Passage of more gas than usual.  Walking can help get rid of the air that was put into your GI tract during the procedure and reduce the bloating. If you had a lower endoscopy (such as a colonoscopy or flexible sigmoidoscopy) you may notice spotting of blood in your stool or on the toilet paper. If you underwent a bowel prep for your procedure, you may not have a normal bowel movement for a few days.  Please Note:  You might notice some irritation and congestion in your nose or some drainage.  This is from the oxygen used during your procedure.  There is no need for concern and it should clear up in a day or so.  SYMPTOMS TO REPORT IMMEDIATELY:   Following lower endoscopy (colonoscopy or flexible sigmoidoscopy):  Excessive amounts of blood in the stool  Significant tenderness or worsening of abdominal pains  Swelling of the abdomen that is new, acute  Fever of 100F or higher    For urgent or emergent issues, a gastroenterologist can be reached at any hour by calling [phone].   DIET:  We do recommend a small meal at first, but then you may proceed to your regular diet.  Drink plenty of fluids but you should avoid alcoholic beverages for 24 hours.  ACTIVITY:  You should plan to take it easy for the rest of  today and you should NOT DRIVE or use heavy machinery until tomorrow (because of the sedation medicines used during the test).    FOLLOW UP: Our staff will call the number listed on your records 48-72 hours following your procedure to check on you and address any questions or concerns that you may have regarding the information given to you following your procedure. If we do not reach you, we will leave a message.  We will attempt to reach you two times.  During this call, we will ask if you have developed any symptoms of COVID 19. If you develop any symptoms (ie: fever, flu-like symptoms, shortness of breath, cough etc.) before then, please call [phone].  If you test positive for Covid 19 in the 2 weeks post procedure, please call and report this information to [place].    If any biopsies were taken you will be contacted by phone or by letter within the next 1-3 weeks.  Please call us at [phone] if you have not heard about the biopsies in 3 weeks.    SIGNATURES/CONFIDENTIALITY: You and/or your care partner have signed paperwork which will be entered into your electronic medical record.  These signatures attest to the fact that that the information above on your After Visit Summary has been reviewed and is understood.  Full responsibility of the confidentiality of this discharge information lies with you and/or your care-partner.

## 2019-02-21 NOTE — Progress Notes (Signed)
PT taken to PACU. Monitors in place. VSS. Report given to RN. 

## 2019-02-23 ENCOUNTER — Telehealth: Payer: Self-pay | Admitting: *Deleted

## 2019-02-23 NOTE — Telephone Encounter (Signed)
Pt called back--stated that she is still having complications.

## 2019-02-23 NOTE — Telephone Encounter (Signed)
Pt states she is having abd. Cramps with sharp pains that comes and goes, Not consent. Pt is Eating and drinking without any nausea and vomiting. Pt denies fever, pt had BM x2, no blood in stool today. Pain scale abdomen pain at "4", peaks at 6 per pt. Pt has passed gas after procedure, none today. Patient has not taken any medication to help with pain. Encouraged patient to take OTC pain meds. if she can take them or OTC gas x. ,instructed patient=  If pain worsen go to the ER or call our 24 hour number that is on her discharge instructions at any time if needed. Instructed patient to call us back on Monday to let us know how she is doing. Pt verbalizes understanding!

## 2019-02-23 NOTE — Telephone Encounter (Signed)
Pt called back and reported that she is having abdominal pain.

## 2019-02-23 NOTE — Telephone Encounter (Signed)
Left message on f/u call 

## 2019-02-23 NOTE — Telephone Encounter (Signed)
  Follow up Call-  Call back number 02/21/2019  Post procedure Call Back phone  # [phone]  Permission to leave phone message Yes  Some recent data might be hidden     Patient questions:  Do you have a fever, pain , or abdominal swelling? Yes.   Pain Score  4 *  Have you tolerated food without any problems? Yes.    Have you been able to return to your normal activities? No.  Do you have any questions about your discharge instructions: Diet   No. Medications  No. Follow up visit  No.  Do you have questions or concerns about your Care? What can she do for headache and lower abdominal pains.  Actions: * If pain score is 4 or above: No action needed, pain <4.  Patient having some sharp intermittent pains on either side of lower abdomen, also having headaches.  Recommended she continue to push fluids, take Tylenol, try heating pad.  Asked patient to call us back before closing this afternoon if she does not notice any relief.  1. Have you developed a fever since your procedure? no  2.   Have you had an respiratory symptoms (SOB or cough) since your procedure? No  3.   Have you tested positive for COVID 19 since your procedure no  4.   Have you had any family members/close contacts diagnosed with the COVID 19 since your procedure?  no   If yes to any of these questions please route to Joylene John, RN and Alphonsa Gin, Therapist, sports.

## 2019-03-05 ENCOUNTER — Telehealth: Payer: Self-pay | Admitting: Gastroenterology

## 2019-03-05 NOTE — Telephone Encounter (Signed)
Pt would like to know why Dr. Tarri Glenn requested her to stop ibu and take APAP instead.

## 2019-03-05 NOTE — Telephone Encounter (Signed)
Patient states she was given Ibuprofen 800 mg for menstrual cramps and has tried taking tylenol but it does not help as much. Patient states that she knows Dr. Tarri Glenn told her to avoid NSAIDs. She wanted to know if you have any other recommendations for her instead of Tylenol or ibuprofen.

## 2019-03-05 NOTE — Telephone Encounter (Signed)
She should review with the provider who prescribed the ibuprofen. Thank you.

## 2019-03-05 NOTE — Telephone Encounter (Signed)
Patient informed and will follow up with prescriber and let them know she cannot have NSAIDs.

## 2019-03-07 ENCOUNTER — Telehealth: Payer: Self-pay | Admitting: Gastroenterology

## 2019-03-07 NOTE — Telephone Encounter (Signed)
Called [facility] to follow up, was transferred to [place] and left a VM. Will continue to f/u until the patient has been scheduled. Spoke to the patient and reported to the patient that the referral was sent at confirmed on 8/19. Told the patient this RN would continue to follow up until the patient has been scheduled.

## 2019-03-07 NOTE — Telephone Encounter (Signed)
Pt inquired whether referral was sent to surgeon.

## 2019-03-07 NOTE — Telephone Encounter (Signed)
Jane [name] from [facility] followed up, patient scheduled with Dr. Marcello Moores on 03/21/2019. Called patient, patient aware, was notified by Judson Roch from [facility]. No other questions or concerns voiced at the time of the call.

## 2019-03-21 ENCOUNTER — Telehealth: Payer: Self-pay | Admitting: General Practice

## 2019-03-21 NOTE — Telephone Encounter (Signed)
Patient called & left message on nurse voicemail line stating she is a patient of Dr Constant. She says she had a negative CT scan recently on the right side where her pain is. She is calling so Dr Elly Modena will take a look at those results and wants to know about approval of a laprascopy for her endometriosis to help with the pain.

## 2019-03-22 NOTE — Telephone Encounter (Signed)
Per Dr Elly Modena, "Schedule an in person visit for annual exam with pap smear (unless she had that done elsewhere) and we can discuss her desire for laparoscopy. She can be scheduled at [facility] if I am not scheduled at [facility] for a while."  Called & discussed with patient and provided [facility]'s phone number. Patient verbalized understanding & had no questions.

## 2019-03-28 ENCOUNTER — Telehealth: Payer: Self-pay | Admitting: Gastroenterology

## 2019-03-28 NOTE — Telephone Encounter (Signed)
Please obtain the records from the surgeon. Thank you.

## 2019-03-28 NOTE — Telephone Encounter (Signed)
Dr. Tarri Glenn Spoke to the patient who said the [facility] surgeon said the "mass" was too large and in a "complicated area" and that she first needs a biopsy and surgery at a larger facility or university. The patient stated the [facility] surgeon would reach out to you to try and coordinate care. The patient is wanting to know what next steps are. Please advise.

## 2019-03-28 NOTE — Telephone Encounter (Signed)
Pt states that she saw MD at [facility] recently and was referred back to Dr. Tarri Glenn. She would like to know whatthe next step in her treatment is and if MD from [facility] was able to speak with Dr. Tarri Glenn regarding her care.

## 2019-03-29 NOTE — Telephone Encounter (Signed)
Request for records faxed to [facility]. Confirmation receipt received. Requested to be faxed to the ATTN: Dr. Thornton Park.

## 2019-03-30 NOTE — Telephone Encounter (Signed)
Pt said she has already seen [facility] and they said they are unable to do surgery because the mass is too large and the placement of it.  They are wanting her to get a biopsy and was told to call her GI doctor to discuss

## 2019-03-30 NOTE — Telephone Encounter (Signed)
Called [facility] to inquire about the fax sent for medical records. Spoke to [place] who reported she did not receive the fax but a successful transmission receipt was received. Paperwork re-faxed to the ATTN: Vaughan Basta   Will follow up on Monday to make sure the fax was received.

## 2019-04-02 NOTE — Telephone Encounter (Signed)
Fax received from [place], [place] placed paperwork in Dr. Tarri Glenn box for her review when she returns to the office.   Patient called and update, reported to the patient that we have received her paperwork and Dr. Tarri Glenn would review when she returned to the office.

## 2019-04-02 NOTE — Telephone Encounter (Signed)
Dr. Tarri Glenn, please review this patient's office/case notes as they were requested and received and placed in your box to advice the patient of next steps.

## 2019-04-04 ENCOUNTER — Other Ambulatory Visit: Payer: Self-pay | Admitting: *Deleted

## 2019-04-04 DIAGNOSIS — R933 Abnormal findings on diagnostic imaging of other parts of digestive tract: Secondary | ICD-10-CM

## 2019-04-04 NOTE — Telephone Encounter (Signed)
Excellent. Thank you for the follow-up.

## 2019-04-04 NOTE — Telephone Encounter (Signed)
FYI- Orders for CT-guided biopsy of the mass in Epic and under review. Per radiology scheduling, the patient will be reached to schedule an appt if it is deemed that the mass can be biopsied.   Per documentation in Epic, patient reached out to Dr. Mora Bellman (OB-GYN) about scheduling an annual exam a pap smear.

## 2019-04-04 NOTE — Telephone Encounter (Signed)
I have reviewed the records from Dr. Marcello Moores. If she did not already arrange for a CT-guided biopsy of the mass, please schedule this. In addition, please make sure that the patient has a follow-up appointment with her gynecologist. Thank you.

## 2019-04-05 ENCOUNTER — Encounter (HOSPITAL_COMMUNITY): Payer: Self-pay

## 2019-04-05 NOTE — Progress Notes (Unsigned)
  Jane [name] Female, 33 y.o., 07-May-1986 MRN:  [id number] Phone:  [phone] [name]) PCP:  Lin Landsman, MD Coverage:  Sherre Poot [employer]  FW: Biopsy Received: Yesterday Message Contents  Arne Cleveland, MD  Thornton Park, MD  Cc: [name]  [facility] is not approachable for percutaneous imaging-guided biopsy by IR.   Laparoscopic or colonoscopic+EUS would be options to consider   Thanks  Arne Cleveland MD  IR   Previous Messages  ----- Message -----  From: Lenore Cordia  Sent: 04/04/2019  3:22 PM EDT  To: Ir Procedure Requests  Subject: Biopsy                      Procedure Requested: CT Biopsy    Reason for Procedure: Biopsy of bulbous appearing appendiceal orifice    Provider Requesting: Thornton Park, MD  Provider Telephone: [phone]    Other Info:  Rad exams in Epic

## 2019-04-05 NOTE — Progress Notes (Unsigned)
Arne Cleveland, MD  Thornton Park, MD  Cc: Jane [name] E  [facility] is not approachable for percutaneous imaging-guided biopsy by IR.   Laparoscopic or colonoscopic+EUS would be options to consider   Thanks  Arne Cleveland MD  IR   Previous Messages  ----- Message -----  From: Jane [name]  Sent: 04/04/2019  3:22 PM EDT  To: Ir Procedure Requests  Subject: Biopsy                      Procedure Requested: CT Biopsy    Reason for Procedure: Biopsy of bulbous appearing appendiceal orifice    Provider Requesting: Thornton Park, MD  Provider Telephone: [phone]    Other Info:  Rad exams in Epic

## 2019-04-06 ENCOUNTER — Telehealth: Payer: Self-pay | Admitting: Gastroenterology

## 2019-04-06 NOTE — Telephone Encounter (Signed)
Explained to the patient that the order for the CT biopsy was placed on 9/23 but due to the location of the "mass" her case would be reviewed by the interventional radiologist to see if the biopsy was possible and if so, they would reach out to her to schedule. If not, Dr. Tarri Glenn would be notified and she would determine what next steps would be. Patient verbalized understanding. No other comments or concerns voiced by the conclusion of the call.

## 2019-04-09 ENCOUNTER — Telehealth: Payer: Self-pay | Admitting: *Deleted

## 2019-04-09 NOTE — Telephone Encounter (Signed)
FYI- Spoke to patient who still had questions and concerns. The patient was scheduled for f/u with Dr. Tarri Glenn on 10/13 at 1:50 pm due to her multiple questions and clear anxiety about what could be done concerning her mass found during her colonoscopy. The patient will call [facility] back with her GYN appt.

## 2019-04-09 NOTE — Telephone Encounter (Signed)
Appt cancelled, patient will update [facility] on when her appt is they GYN.

## 2019-04-09 NOTE — Telephone Encounter (Signed)
Left message for the patient to call back.

## 2019-04-09 NOTE — Telephone Encounter (Signed)
-----   Message from Thornton Park, MD sent at 04/09/2019 12:26 PM EDT ----- Please call the patient.  I reviewed her CT scans with several physicians including the surgeon that she saw in consultation, Dr. Marcello Moores and the radiologist.  They are unable to access this mass for a biopsy.  Dr. Marcello Moores has suggested follow-up with her gynecologist and noted that the patient was going to arrange this follow-up.  Would you please help me be sure that this has happened and to get any of our records to her gynecologist to assist in decision-making.  Thank you.

## 2019-04-09 NOTE — Telephone Encounter (Signed)
Unfortunately, we are dependent on her GYN determining if there is something that can be done. If the GYN does not feel that there is, she would need to follow-up with the surgeon, Dr. Marcello Moores. Thanks.

## 2019-04-24 ENCOUNTER — Ambulatory Visit: Payer: BC Managed Care – PPO | Admitting: Gastroenterology

## 2019-04-27 ENCOUNTER — Other Ambulatory Visit: Payer: Self-pay

## 2019-04-27 ENCOUNTER — Ambulatory Visit (INDEPENDENT_AMBULATORY_CARE_PROVIDER_SITE_OTHER): Payer: BC Managed Care – PPO | Admitting: Lactation Services

## 2019-04-27 DIAGNOSIS — Z113 Encounter for screening for infections with a predominantly sexual mode of transmission: Secondary | ICD-10-CM

## 2019-04-27 DIAGNOSIS — N898 Other specified noninflammatory disorders of vagina: Secondary | ICD-10-CM

## 2019-04-27 NOTE — Progress Notes (Signed)
Pt in the office for a self swab. Pt noted itching in the last few days with some discharge that was clear. Pt reports she has had previously. Pt reports she is feeling a little better today.   Pt informed results should be back on Monday and will be notified of any abnormal results.

## 2019-04-30 ENCOUNTER — Other Ambulatory Visit: Payer: Self-pay

## 2019-04-30 ENCOUNTER — Ambulatory Visit (INDEPENDENT_AMBULATORY_CARE_PROVIDER_SITE_OTHER): Payer: BC Managed Care – PPO | Admitting: Obstetrics and Gynecology

## 2019-04-30 ENCOUNTER — Encounter: Payer: Self-pay | Admitting: Obstetrics and Gynecology

## 2019-04-30 VITALS — BP 119/74 | HR 72 | Wt 122.8 lb

## 2019-04-30 DIAGNOSIS — N979 Female infertility, unspecified: Secondary | ICD-10-CM | POA: Diagnosis not present

## 2019-04-30 DIAGNOSIS — N809 Endometriosis, unspecified: Secondary | ICD-10-CM

## 2019-04-30 DIAGNOSIS — N806 Endometriosis in cutaneous scar: Secondary | ICD-10-CM | POA: Diagnosis not present

## 2019-04-30 MED ORDER — OXYCODONE-ACETAMINOPHEN 5-325 MG PO TABS: 1.0000 | ORAL_TABLET | Freq: Four times a day (QID) | ORAL | 0 refills | Status: DC | PRN

## 2019-04-30 NOTE — Progress Notes (Signed)
Pt is in the office for follow up after CT scan. Pt states that she is in a lot of pain from cramping today due to menstrual cycle starting last night, rating 7/10.

## 2019-04-30 NOTE — Progress Notes (Addendum)
33 yo P0 here to discuss results from recent CT scan. Patient with long standing history of endometriosis and infertility. Patient reports onset menses yesterday with onset of RLQ pain a few days prior. Pain is not relieved by ibuprofen. She was seen by GI with a normal evaluation. Patient returns today ready for surgical interventions  Past Medical History:  Diagnosis Date  . Bladder infection   . Endometriosis   . GERD (gastroesophageal reflux disease)   . Migraine   . UTI (lower urinary tract infection)    Past Surgical History:  Procedure Laterality Date  . UMBILICAL HERNIA REPAIR     Family History  Problem Relation Age of Onset  . Hypertension Maternal Grandmother   . Stroke Maternal Grandmother   . Cancer Maternal Grandmother   . Cancer Paternal Grandmother   . Endometriosis Sister    Social History   Tobacco Use  . Smoking status: Never Smoker  . Smokeless tobacco: Never Used  Substance Use Topics  . Alcohol use: No  . Drug use: No   ROS See pertinent in HPI  Blood pressure 119/74, pulse 72, weight 122 lb 12.8 oz (55.7 kg), last menstrual period 04/29/2019.  Patient declined pelvic exam due to pain GENERAL: Well-developed, well-nourished female in no acute distress.  EXTREMITIES: No cyanosis, clubbing, or edema, 2+ distal pulses.  01/2019 FINDINGS: Lower Chest: No acute findings.  Hepatobiliary: No hepatic masses identified. Gallbladder is unremarkable.  Pancreas:  No mass or inflammatory changes.  Spleen: Within normal limits in size and appearance.  Adrenals/Urinary Tract: No masses identified. Stable small right renal cyst. No evidence of hydronephrosis.  Stomach/Bowel: A ill-defined soft tissue density is seen the right lower quadrant involving the tip of the cecum. This measures 3.2 x 1.9 cm, and is new since previous study. No surrounding acute inflammatory changes seen. No evidence bowel wall thickening involving the terminal ileum or other  adjacent small bowel loops. No evidence of abscess or free fluid. No evidence of bowel obstruction.  Vascular/Lymphatic: No pathologically enlarged lymph nodes. No abdominal aortic aneurysm.  Reproductive: No mass or other significant abnormality. No evidence of ascites.  Other:  None.  Musculoskeletal:  No suspicious bone lesions identified.  IMPRESSION: 3 cm ill-defined soft tissue density involving the base of the cecum. Differential diagnosis includes endometriosis, chronic appendicitis, appendiceal neoplasm, and cecal carcinoma.   Electronically Signed   By: Marlaine Hind M.D.   On: 01/24/2019 10:28  A/P 33 yo with  - Patient declined pap smear today - Reviewed CT scan finding of a 3 cm mass near cecum. This could represent endometriosis or scar tissue as a result of endometriosis.  - Given location of mass, discussed with the patient that the best surgical strategy is referral to Dr. Kerin Perna for surgical management and fertility evaluation given her endometriosis history. Patient agrees to this referral - Rx percocet provided for pain relief - Patient plans to return for annual exam with pap smear

## 2019-05-01 LAB — CERVICOVAGINAL ANCILLARY ONLY
Bacterial Vaginitis (gardnerella): NEGATIVE
Candida Glabrata: NEGATIVE
Candida Vaginitis: NEGATIVE
Chlamydia: NEGATIVE
Comment: NEGATIVE
Comment: NEGATIVE
Comment: NEGATIVE
Comment: NEGATIVE
Comment: NEGATIVE
Comment: NORMAL
Neisseria Gonorrhea: NEGATIVE
Trichomonas: NEGATIVE

## 2019-05-18 ENCOUNTER — Encounter (HOSPITAL_COMMUNITY): Payer: Self-pay | Admitting: *Deleted

## 2019-05-18 ENCOUNTER — Emergency Department (HOSPITAL_COMMUNITY)
Admission: EM | Admit: 2019-05-18 | Discharge: 2019-05-18 | Disposition: A | Discharge status: Home / Self Care | Payer: BC Managed Care – PPO | Attending: Emergency Medicine | Admitting: Emergency Medicine

## 2019-05-18 DIAGNOSIS — N809 Endometriosis, unspecified: Secondary | ICD-10-CM | POA: Diagnosis not present

## 2019-05-18 DIAGNOSIS — R1084 Generalized abdominal pain: Secondary | ICD-10-CM | POA: Diagnosis present

## 2019-05-18 DIAGNOSIS — Z79899 Other long term (current) drug therapy: Secondary | ICD-10-CM | POA: Diagnosis not present

## 2019-05-18 LAB — CBC WITH DIFFERENTIAL/PLATELET
Abs Immature Granulocytes: 0.02 10*3/uL (ref 0.00–0.07)
Basophils Absolute: 0 10*3/uL (ref 0.0–0.1)
Basophils Relative: 0 %
Eosinophils Absolute: 0 10*3/uL (ref 0.0–0.5)
Eosinophils Relative: 0 %
HCT: 38.2 % (ref 36.0–46.0)
Hemoglobin: 12.1 g/dL (ref 12.0–15.0)
Immature Granulocytes: 0 %
Lymphocytes Relative: 16 %
Lymphs Abs: 1 10*3/uL (ref 0.7–4.0)
MCH: 30.6 pg (ref 26.0–34.0)
MCHC: 31.7 g/dL (ref 30.0–36.0)
MCV: 96.5 fL (ref 80.0–100.0)
Monocytes Absolute: 0.3 10*3/uL (ref 0.1–1.0)
Monocytes Relative: 5 %
Neutro Abs: 5 10*3/uL (ref 1.7–7.7)
Neutrophils Relative %: 79 %
Platelets: 228 10*3/uL (ref 150–400)
RBC: 3.96 MIL/uL (ref 3.87–5.11)
RDW: 13 % (ref 11.5–15.5)
WBC: 6.3 10*3/uL (ref 4.0–10.5)
nRBC: 0 % (ref 0.0–0.2)

## 2019-05-18 LAB — URINALYSIS, ROUTINE W REFLEX MICROSCOPIC
Bilirubin Urine: NEGATIVE
Glucose, UA: NEGATIVE mg/dL
Ketones, ur: NEGATIVE mg/dL
Leukocytes,Ua: NEGATIVE
Nitrite: NEGATIVE
Protein, ur: NEGATIVE mg/dL
Specific Gravity, Urine: 1.011 (ref 1.005–1.030)
pH: 8 (ref 5.0–8.0)

## 2019-05-18 LAB — LIPASE, BLOOD: Lipase: 30 U/L (ref 11–51)

## 2019-05-18 LAB — COMPREHENSIVE METABOLIC PANEL
ALT: 12 U/L (ref 0–44)
AST: 13 U/L — ABNORMAL LOW (ref 15–41)
Albumin: 4.1 g/dL (ref 3.5–5.0)
Alkaline Phosphatase: 54 U/L (ref 38–126)
Anion gap: 6 (ref 5–15)
BUN: 6 mg/dL (ref 6–20)
CO2: 25 mmol/L (ref 22–32)
Calcium: 9 mg/dL (ref 8.9–10.3)
Chloride: 106 mmol/L (ref 98–111)
Creatinine, Ser: 0.71 mg/dL (ref 0.44–1.00)
GFR calc Af Amer: >60 mL/min (ref >60)
GFR calc non Af Amer: >60 mL/min (ref >60)
Glucose, Bld: 94 mg/dL (ref 70–99)
Potassium: 4.3 mmol/L (ref 3.5–5.1)
Sodium: 137 mmol/L (ref 135–145)
Total Bilirubin: 1 mg/dL (ref 0.3–1.2)
Total Protein: 7 g/dL (ref 6.5–8.1)

## 2019-05-18 LAB — POC URINE PREG, ED: Preg Test, Ur: NEGATIVE

## 2019-05-18 MED ORDER — SODIUM CHLORIDE 0.9 % IV BOLUS: 1000.0000 mL | Freq: Once | INTRAVENOUS | Status: DC

## 2019-05-18 MED ORDER — HYDROMORPHONE HCL 1 MG/ML IJ SOLN
1.0000 mg | Freq: Once | INTRAMUSCULAR | Status: AC
  Administered 2019-05-18: 1 mg via INTRAVENOUS
  Filled 2019-05-18: qty 1

## 2019-05-18 MED ORDER — ONDANSETRON HCL 4 MG/2ML IJ SOLN
4.0000 mg | Freq: Once | INTRAMUSCULAR | Status: AC
  Administered 2019-05-18: 4 mg via INTRAVENOUS
  Filled 2019-05-18: qty 2

## 2019-05-18 MED ORDER — ONDANSETRON 4 MG PO TBDP: 4.0000 mg | ORAL_TABLET | Freq: Three times a day (TID) | ORAL | 0 refills | Status: DC | PRN

## 2019-05-18 MED ORDER — FENTANYL CITRATE (PF) 100 MCG/2ML IJ SOLN
50.0000 ug | Freq: Once | INTRAMUSCULAR | Status: AC
  Administered 2019-05-18: 50 ug via INTRAVENOUS
  Filled 2019-05-18: qty 2

## 2019-05-18 NOTE — ED Triage Notes (Signed)
Per EMS, pt w/ hx of endometriosis complains of severe pain, states she is currently on menstrual cycle.   BP 103/54 O2 99 HR 68 CBG 104

## 2019-05-18 NOTE — ED Notes (Signed)
Pt aware urine sample is needed 

## 2019-05-18 NOTE — ED Notes (Signed)
U/A and culture sent to mini-lab for POC, requested specimens be sent to main lab for other ordered analysis. [employer]

## 2019-05-18 NOTE — ED Provider Notes (Signed)
[facility]-EMERGENCY DEPT Provider Note   CSN: [id number] Arrival date & time: 05/18/19  1052     History   Chief Complaint Chief Complaint  Patient presents with  . Abdominal Pain  . Vaginal Bleeding    HPI Jane [name] is a 33 y.o. female.     The history is provided by the patient and medical records. No language interpreter was used.  Abdominal Pain Associated symptoms: vaginal bleeding   Vaginal Bleeding Associated symptoms: abdominal pain      33 year old female with history of endometriosis, ovarian cysts, GERD brought here via EMS for evaluation of abdominal pain.  Patient report for the past 2 days she has been experiencing progressive worsening pain throughout her abdomen and her significant in her epigastric region.  Pain is sharp, throbbing, achy, worsening with movement.  She also endorsed nausea, multiple episodes of nonbloody nonbilious vomiting and some loose stools.  Her pain is very similar to prior endometrial pain that she has been experiencing throughout most of her life.  She is currently on her menstruation.  She normally able to manage her pain at home but due to her nausea and vomiting, she is unable to keep her medication down.  She does have an appointment with specialist in January for laparoscopy.  She mention she has endometrial implantation in her cecum that normally cause pain during menstruation.  She denies any dysuria vaginal discharge and concern for infection.  No recent sick contact with anyone with COVID-19.  Past Medical History:  Diagnosis Date  . Bladder infection   . Endometriosis   . GERD (gastroesophageal reflux disease)   . Migraine   . UTI (lower urinary tract infection)     Patient Active Problem List   Diagnosis Date Noted  . Vitamin D deficiency 09/09/2014  . Dental cavities 10/09/2013  . Abdominal bloating 10/09/2013  . Right shoulder pain 10/09/2013  . Endometriosis of pelvic  peritoneum 01/09/2011  . Other and unspecified ovarian cyst 01/09/2011  . Cocaine abuse, unspecified 01/09/2011    Past Surgical History:  Procedure Laterality Date  . UMBILICAL HERNIA REPAIR       OB History   No obstetric history on file.      Home Medications    Prior to Admission medications   Medication Sig Start Date End Date Taking? Authorizing Provider  acetaminophen (TYLENOL) 500 MG tablet Take 1,000 mg by mouth every 6 (six) hours as needed.    [provider]  ibuprofen (ADVIL,MOTRIN) 800 MG tablet Take 1 tablet (800 mg total) by mouth 3 (three) times daily. 10/15/18   Joy, Shawn C, PA-C  ondansetron (ZOFRAN ODT) 8 MG disintegrating tablet Take 1 tablet (8 mg total) by mouth every 8 (eight) hours as needed for nausea or vomiting. Patient not taking: Reported on 02/21/2019 10/15/18   Anselm PancoastJoy, Shawn C, PA-C  oxyCODONE-acetaminophen (PERCOCET/ROXICET) 5-325 MG tablet Take 1 tablet by mouth every 6 (six) hours as needed. 04/30/19   Constant, Peggy, MD  pantoprazole (PROTONIX) 40 MG tablet Take 1 tablet (40 mg total) by mouth daily. 02/14/19 05/15/19  Tressia DanasBeavers, Kimberly, MD    Family History Family History  Problem Relation Age of Onset  . Hypertension Maternal Grandmother   . Stroke Maternal Grandmother   . Cancer Maternal Grandmother   . Cancer Paternal Grandmother   . Endometriosis Sister     Social History Social History   Tobacco Use  . Smoking status: Never Smoker  . Smokeless tobacco: Never  Used  Substance Use Topics  . Alcohol use: No  . Drug use: No     Allergies   Toradol [ketorolac tromethamine], Clindamycin/lincomycin, and Vicodin [hydrocodone-acetaminophen]   Review of Systems Review of Systems  Gastrointestinal: Positive for abdominal pain.  Genitourinary: Positive for vaginal bleeding.  All other systems reviewed and are negative.    Physical Exam Updated Vital Signs BP (!) 107/59 (BP Location: Right Arm)   Pulse 78   Temp 98.8 F  (37.1 C) (Oral)   Resp 18   LMP 05/17/2019   SpO2 100%   Physical Exam Vitals signs and nursing note reviewed.  Constitutional:      General: She is not in acute distress.    Appearance: She is well-developed.  HENT:     Head: Atraumatic.  Eyes:     Conjunctiva/sclera: Conjunctivae normal.  Neck:     Musculoskeletal: Neck supple.  Cardiovascular:     Rate and Rhythm: Normal rate and regular rhythm.  Pulmonary:     Effort: Pulmonary effort is normal.     Breath sounds: Normal breath sounds.  Abdominal:     General: Abdomen is flat. Bowel sounds are normal.     Palpations: Abdomen is soft.     Tenderness: There is generalized abdominal tenderness. There is no guarding or rebound.  Skin:    General: Skin is warm.     Findings: No rash.  Neurological:     Mental Status: She is alert.  Psychiatric:        Mood and Affect: Mood normal.      ED Treatments / Results  Labs (all labs ordered are listed, but only abnormal results are displayed) Labs Reviewed  COMPREHENSIVE METABOLIC PANEL - Abnormal; Notable for the following components:      Result Value   AST 13 (*)    All other components within normal limits  URINALYSIS, ROUTINE W REFLEX MICROSCOPIC - Abnormal; Notable for the following components:   Hgb urine dipstick MODERATE (*)    Bacteria, UA RARE (*)    All other components within normal limits  CBC WITH DIFFERENTIAL/PLATELET  LIPASE, BLOOD  POC URINE PREG, ED    EKG None  Radiology No results found.  Procedures Procedures (including critical care time)  Medications Ordered in ED Medications  sodium chloride 0.9 % bolus 1,000 mL (1,000 mLs Intravenous New Bag/Given 05/18/19 1149)  fentaNYL (SUBLIMAZE) injection 50 mcg (50 mcg Intravenous Given 05/18/19 1143)  ondansetron (ZOFRAN) injection 4 mg (4 mg Intravenous Given 05/18/19 1143)  HYDROmorphone (DILAUDID) injection 1 mg (1 mg Intravenous Given 05/18/19 1324)     Initial Impression / Assessment and  Plan / ED Course  I have reviewed the triage vital signs and the nursing notes.  Pertinent labs & imaging results that were available during my care of the patient were reviewed by me and considered in my medical decision making (see chart for details).        BP (!) 120/49 (BP Location: Right Arm)   Pulse 74   Temp 98.8 F (37.1 C) (Oral)   Resp 20   LMP 05/17/2019   SpO2 100%    Final Clinical Impressions(s) / ED Diagnoses   Final diagnoses:  Endometriosis    ED Discharge Orders         Ordered    ondansetron (ZOFRAN-ODT) 4 MG disintegrating tablet  Every 8 hours PRN     05/18/19 1439         11:28 AM Patient  here with abdominal pain and associate nausea and vomiting some loose stools.  She attributed pain to her endometrial pain that she has been experiencing throughout her life.  This pain is no different but just more intense.  She is unable to keep home medication down due to her nausea.  Will provide symptomatic treatment, will check labs.  2:07 PM Pregnancy test is negative, labs are reassuring, no anemia, UA shows moderate hemoglobin and urine dipsticks however this is likely secondary to her current menstruation.  No evidence of UTI.  Patient is currently receiving several doses of pain medication for symptomatic management.  2:38 PM Patient felt better with symptomatic treatment.  She is stable for discharge.  Will discharge home with antinausea medication to use as needed.  Return precautions discussed.   Fayrene Helper, PA-C 05/18/19 1440    Terald Sleeper, MD 05/19/19 1155

## 2019-06-11 ENCOUNTER — Encounter: Payer: Self-pay | Admitting: *Deleted

## 2019-06-11 NOTE — Progress Notes (Signed)
Fax received from [facility] for refill request of Pantoprazole 40 mg - 1 tablet po daily. Per chart review, this medication was prescribed on 02/14/19 by Dr. Thornton Park [facility]) for the problem of GERD. Refill denied from this office. Fax returned to [facility] stating reason for denial as well as name of prescribing provider.

## 2019-07-26 ENCOUNTER — Ambulatory Visit: Payer: BC Managed Care – PPO

## 2019-07-27 ENCOUNTER — Inpatient Hospital Stay (HOSPITAL_COMMUNITY)
Admission: EM | Admit: 2019-07-27 | Discharge: 2019-07-29 | DRG: 392 | Disposition: A | Discharge status: Home / Self Care | Payer: BC Managed Care – PPO | Attending: Internal Medicine | Admitting: Internal Medicine

## 2019-07-27 ENCOUNTER — Encounter (HOSPITAL_COMMUNITY): Payer: Self-pay | Admitting: Obstetrics and Gynecology

## 2019-07-27 ENCOUNTER — Other Ambulatory Visit: Payer: Self-pay

## 2019-07-27 DIAGNOSIS — N946 Dysmenorrhea, unspecified: Secondary | ICD-10-CM | POA: Diagnosis present

## 2019-07-27 DIAGNOSIS — R1084 Generalized abdominal pain: Secondary | ICD-10-CM

## 2019-07-27 DIAGNOSIS — Z79899 Other long term (current) drug therapy: Secondary | ICD-10-CM

## 2019-07-27 DIAGNOSIS — R112 Nausea with vomiting, unspecified: Principal | ICD-10-CM | POA: Diagnosis present

## 2019-07-27 DIAGNOSIS — Z823 Family history of stroke: Secondary | ICD-10-CM

## 2019-07-27 DIAGNOSIS — N803 Endometriosis of pelvic peritoneum, unspecified: Secondary | ICD-10-CM | POA: Diagnosis present

## 2019-07-27 DIAGNOSIS — Z885 Allergy status to narcotic agent status: Secondary | ICD-10-CM

## 2019-07-27 DIAGNOSIS — Z20822 Contact with and (suspected) exposure to covid-19: Secondary | ICD-10-CM | POA: Diagnosis present

## 2019-07-27 DIAGNOSIS — Z881 Allergy status to other antibiotic agents status: Secondary | ICD-10-CM

## 2019-07-27 DIAGNOSIS — T39395A Adverse effect of other nonsteroidal anti-inflammatory drugs [NSAID], initial encounter: Secondary | ICD-10-CM | POA: Diagnosis present

## 2019-07-27 DIAGNOSIS — E559 Vitamin D deficiency, unspecified: Secondary | ICD-10-CM | POA: Diagnosis present

## 2019-07-27 DIAGNOSIS — E860 Dehydration: Secondary | ICD-10-CM | POA: Diagnosis present

## 2019-07-27 DIAGNOSIS — K219 Gastro-esophageal reflux disease without esophagitis: Secondary | ICD-10-CM | POA: Diagnosis present

## 2019-07-27 DIAGNOSIS — Z8249 Family history of ischemic heart disease and other diseases of the circulatory system: Secondary | ICD-10-CM

## 2019-07-27 DIAGNOSIS — I959 Hypotension, unspecified: Secondary | ICD-10-CM | POA: Diagnosis present

## 2019-07-27 DIAGNOSIS — K5900 Constipation, unspecified: Secondary | ICD-10-CM | POA: Diagnosis present

## 2019-07-27 LAB — COMPREHENSIVE METABOLIC PANEL
ALT: 8 U/L (ref 0–44)
AST: 15 U/L (ref 15–41)
Albumin: 2.4 g/dL — ABNORMAL LOW (ref 3.5–5.0)
Alkaline Phosphatase: 30 U/L — ABNORMAL LOW (ref 38–126)
Anion gap: 4 — ABNORMAL LOW (ref 5–15)
BUN: 7 mg/dL (ref 6–20)
CO2: 16 mmol/L — ABNORMAL LOW (ref 22–32)
Calcium: 5.8 mg/dL — CL (ref 8.9–10.3)
Chloride: 119 mmol/L — ABNORMAL HIGH (ref 98–111)
Creatinine, Ser: 0.38 mg/dL — ABNORMAL LOW (ref 0.44–1.00)
GFR calc Af Amer: >60 mL/min (ref >60)
GFR calc non Af Amer: >60 mL/min (ref >60)
Glucose, Bld: 68 mg/dL — ABNORMAL LOW (ref 70–99)
Potassium: 3.7 mmol/L (ref 3.5–5.1)
Sodium: 139 mmol/L (ref 135–145)
Total Bilirubin: 0.6 mg/dL (ref 0.3–1.2)
Total Protein: 4.3 g/dL — ABNORMAL LOW (ref 6.5–8.1)

## 2019-07-27 LAB — URINALYSIS, ROUTINE W REFLEX MICROSCOPIC
Bilirubin Urine: NEGATIVE
Glucose, UA: NEGATIVE mg/dL
Ketones, ur: NEGATIVE mg/dL
Leukocytes,Ua: NEGATIVE
Nitrite: NEGATIVE
Protein, ur: NEGATIVE mg/dL
Specific Gravity, Urine: 1.014 (ref 1.005–1.030)
pH: 6 (ref 5.0–8.0)

## 2019-07-27 LAB — CBC WITH DIFFERENTIAL/PLATELET
Abs Immature Granulocytes: 0.01 10*3/uL (ref 0.00–0.07)
Basophils Absolute: 0 10*3/uL (ref 0.0–0.1)
Basophils Relative: 0 %
Eosinophils Absolute: 0 10*3/uL (ref 0.0–0.5)
Eosinophils Relative: 0 %
HCT: 37.1 % (ref 36.0–46.0)
Hemoglobin: 11 g/dL — ABNORMAL LOW (ref 12.0–15.0)
Immature Granulocytes: 0 %
Lymphocytes Relative: 16 %
Lymphs Abs: 0.9 10*3/uL (ref 0.7–4.0)
MCH: 29.6 pg (ref 26.0–34.0)
MCHC: 29.6 g/dL — ABNORMAL LOW (ref 30.0–36.0)
MCV: 100 fL (ref 80.0–100.0)
Monocytes Absolute: 0.4 10*3/uL (ref 0.1–1.0)
Monocytes Relative: 6 %
Neutro Abs: 4.5 10*3/uL (ref 1.7–7.7)
Neutrophils Relative %: 78 %
Platelets: 161 10*3/uL (ref 150–400)
RBC: 3.71 MIL/uL — ABNORMAL LOW (ref 3.87–5.11)
RDW: 12.8 % (ref 11.5–15.5)
WBC: 5.8 10*3/uL (ref 4.0–10.5)
nRBC: 0 % (ref 0.0–0.2)

## 2019-07-27 LAB — RESPIRATORY PANEL BY RT PCR (FLU A&B, COVID)
Influenza A by PCR: NEGATIVE
Influenza B by PCR: NEGATIVE
SARS Coronavirus 2 by RT PCR: NEGATIVE

## 2019-07-27 LAB — POC URINE PREG, ED: Preg Test, Ur: NEGATIVE

## 2019-07-27 LAB — VITAMIN D 25 HYDROXY (VIT D DEFICIENCY, FRACTURES): Vit D, 25-Hydroxy: 9.65 ng/mL — ABNORMAL LOW (ref 30–100)

## 2019-07-27 LAB — MAGNESIUM: Magnesium: 1.6 mg/dL — ABNORMAL LOW (ref 1.7–2.4)

## 2019-07-27 LAB — CALCIUM: Calcium: 8.2 mg/dL — ABNORMAL LOW (ref 8.9–10.3)

## 2019-07-27 LAB — LIPASE, BLOOD: Lipase: 27 U/L (ref 11–51)

## 2019-07-27 MED ORDER — FENTANYL CITRATE (PF) 100 MCG/2ML IJ SOLN
50.0000 ug | Freq: Once | INTRAMUSCULAR | Status: AC
  Administered 2019-07-27: 50 ug via INTRAVENOUS
  Filled 2019-07-27: qty 2

## 2019-07-27 MED ORDER — OXYCODONE-ACETAMINOPHEN 5-325 MG PO TABS: 1.0000 | ORAL_TABLET | Freq: Four times a day (QID) | ORAL | Status: DC | PRN

## 2019-07-27 MED ORDER — SODIUM CHLORIDE 0.9 % IV SOLN: INTRAVENOUS | Status: DC

## 2019-07-27 MED ORDER — CALCIUM GLUCONATE-NACL 1-0.675 GM/50ML-% IV SOLN
1.0000 g | Freq: Once | INTRAVENOUS | Status: AC
  Administered 2019-07-27: 1000 mg via INTRAVENOUS
  Filled 2019-07-27: qty 50

## 2019-07-27 MED ORDER — PANTOPRAZOLE SODIUM 40 MG PO TBEC
40.0000 mg | DELAYED_RELEASE_TABLET | Freq: Every day | ORAL | Status: DC
  Filled 2019-07-27: qty 1

## 2019-07-27 MED ORDER — ONDANSETRON HCL 4 MG/2ML IJ SOLN
4.0000 mg | Freq: Once | INTRAMUSCULAR | Status: AC
  Administered 2019-07-27: 4 mg via INTRAVENOUS
  Filled 2019-07-27: qty 2

## 2019-07-27 MED ORDER — ONDANSETRON HCL 4 MG/2ML IJ SOLN
4.0000 mg | Freq: Four times a day (QID) | INTRAMUSCULAR | Status: DC | PRN
  Administered 2019-07-27: 4 mg via INTRAVENOUS
  Filled 2019-07-27: qty 2

## 2019-07-27 MED ORDER — SODIUM CHLORIDE 0.9 % IV BOLUS
1000.0000 mL | Freq: Once | INTRAVENOUS | Status: AC
  Administered 2019-07-27: 1000 mL via INTRAVENOUS

## 2019-07-27 MED ORDER — SODIUM CHLORIDE 0.9 % IV SOLN: 1.0000 g | Freq: Once | INTRAVENOUS | Status: DC

## 2019-07-27 MED ORDER — SENNOSIDES-DOCUSATE SODIUM 8.6-50 MG PO TABS: 1.0000 | ORAL_TABLET | Freq: Every evening | ORAL | Status: DC | PRN

## 2019-07-27 MED ORDER — HYDROMORPHONE HCL 1 MG/ML IJ SOLN
1.0000 mg | INTRAMUSCULAR | Status: DC | PRN
  Administered 2019-07-27 – 2019-07-28 (×4): 1 mg via INTRAVENOUS
  Filled 2019-07-27 (×4): qty 1

## 2019-07-27 MED ORDER — ENOXAPARIN SODIUM 40 MG/0.4ML ~~LOC~~ SOLN
40.0000 mg | SUBCUTANEOUS | Status: DC
  Administered 2019-07-27: 40 mg via SUBCUTANEOUS
  Filled 2019-07-27 (×2): qty 0.4

## 2019-07-27 NOTE — ED Triage Notes (Signed)
Per EMS: Patient is coming from home for abdominal pain and N/V from reported Endometriosis.  Patient reportedly comes to the ED for pain management. Patient given 4mg  of Zofran  50mg  of Fentanyl  of NS.  Pt's VSS.  100/68 BP O2 100% on RA 67HR No reported fever and no emesis with Ems.

## 2019-07-27 NOTE — H&P (Signed)
History and Physical        Hospital Admission Note Date: 07/27/2019  Patient name: Jane [name] Medical record number: [id number] Date of birth: November 04, 1985 Age: 34 y.o. Gender: female  PCP: Leilani Able, MD    Patient coming from: Home  I have reviewed all records in the [facility]    Chief Complaint:  Abdominal pain with nausea and vomiting since yesterday  HPI: Patient is a 34 year old female with longstanding history of endometriosis, follows outpatient OB/GYN, menstrual cramping, GERD presented with abdominal pain, nausea and vomiting since yesterday.  Patient reported that she started her menstrual cycle yesterday, subsequently she started having generalized abdominal pain, worse in lower quadrants, typical of endometriosis and menstrual cramps.  Around midnight, she took 800 mg of ibuprofen, and then started having nausea and vomiting x1.  Per patient she has not been able to hold anything down due to nausea and dry heaving.  No fevers or chills.  Patient ports that pain was not improving with medications at home. In ED,  BP was noticed to be low 89/49 and in low 90s Lab work showed severely low calcium 5.8, TRH was requested for admission  COVID-19 test pending  ED work-up/course:   BP 102/66, lowest 89/49.  Temp 97.9, respiratory 16, heart rate 77, O2 sats 100% on room air Sodium 139, potassium 3.7, BUN 7, creatinine 0.38, calcium 5.8. WBCs 5.8, hemoglobin 11.0  Review of Systems: Positives marked in 'bold' Constitutional: Denies fever, chills, diaphoresis, poor appetite and fatigue.  HEENT: Denies photophobia, eye pain, redness, hearing loss, ear pain, congestion, sore throat, rhinorrhea, sneezing, mouth sores, trouble swallowing, neck pain, neck stiffness and tinnitus.   Respiratory: Denies SOB, DOE, cough, chest tightness,  and  wheezing.   Cardiovascular: Denies chest pain, palpitations and leg swelling.  Gastrointestinal: Please see HPI Genitourinary: Denies dysuria, urgency, frequency, hematuria, flank pain and difficulty urinating.  Musculoskeletal: Denies myalgias, back pain, joint swelling, arthralgias and gait problem.  Skin: Denies pallor, rash and wound.  Neurological: Feels weak, dizzy, no seizures. Hematological: Denies adenopathy. Easy bruising, personal or family bleeding history  Psychiatric/Behavioral: Denies suicidal ideation, mood changes, confusion, nervousness, sleep disturbance and agitation  Past Medical History: Past Medical History:  Diagnosis Date  . Bladder infection   . Endometriosis   . GERD (gastroesophageal reflux disease)   . Migraine   . UTI (lower urinary tract infection)     Past Surgical History:  Procedure Laterality Date  . UMBILICAL HERNIA REPAIR      Medications: Prior to Admission medications   Medication Sig Start Date End Date Taking? Authorizing Provider  acetaminophen (TYLENOL) 500 MG tablet Take 1,000 mg by mouth every 6 (six) hours as needed for moderate pain (Cramps).    Yes [provider]  ibuprofen (ADVIL,MOTRIN) 800 MG tablet Take 1 tablet (800 mg total) by mouth 3 (three) times daily. Patient taking differently: Take 800 mg by mouth daily as needed (Cramps).  10/15/18  Yes Joy, Shawn C, PA-C  ondansetron (ZOFRAN-ODT) 4 MG disintegrating tablet Take 1 tablet (4 mg total) by mouth every 8 (eight) hours as needed for nausea or vomiting. 05/18/19  Yes Fayrene Helper,  PA-C  oxyCODONE-acetaminophen (PERCOCET/ROXICET) 5-325 MG tablet Take 1 tablet by mouth every 6 (six) hours as needed. Patient taking differently: Take 1 tablet by mouth every 6 (six) hours as needed for moderate pain (Cramps).  04/30/19  Yes Constant, Peggy, MD  pantoprazole (PROTONIX) 40 MG tablet Take 1 tablet (40 mg total) by mouth daily. 02/14/19 07/27/19 Yes Tressia Danas, MD     Allergies:   Allergies  Allergen Reactions  . Toradol [Ketorolac Tromethamine] Anxiety    suicidal  . Clindamycin/Lincomycin Hives  . Vicodin [Hydrocodone-Acetaminophen] Nausea And Vomiting    Social History:  reports that she has never smoked. She has never used smokeless tobacco. She reports that she does not drink alcohol or use drugs.  Family History: Family History  Problem Relation Age of Onset  . Hypertension Maternal Grandmother   . Stroke Maternal Grandmother   . Cancer Maternal Grandmother   . Cancer Paternal Grandmother   . Endometriosis Sister     Physical Exam: Blood pressure 93/63, pulse 78, temperature 97.9 F (36.6 C), temperature source Oral, resp. rate 15, last menstrual period 07/25/2019, SpO2 100 %. General: Alert, awake, oriented x3, in no acute distress. Eyes: pink conjunctiva,anicteric sclera, pupils equal and reactive to light and accomodation, HEENT: normocephalic, atraumatic, oropharynx clear, dry mucosal membranes Neck: supple, no masses or lymphadenopathy, no goiter, no bruits, no JVD CVS: Regular rate and rhythm, without murmurs, rubs or gallops. No lower extremity edema Resp : Clear to auscultation bilaterally, no wheezing, rales or rhonchi. GI : Soft, mild diffuse TTP, no rebound or guarding, ND positive bowel sounds, No hepatomegaly. No hernia.  Musculoskeletal: No clubbing or cyanosis, positive pedal pulses. No contracture. ROM intact  Neuro: Grossly intact, no focal neurological deficits, strength 5/5 upper and lower extremities bilaterally Psych: alert and oriented x 3, normal mood and affect Skin: no rashes or lesions, warm and dry   LABS on Admission: I have personally reviewed all the labs and imagings below    Basic Metabolic Panel: Recent Labs  Lab 07/27/19 1012  NA 139  K 3.7  CL 119*  CO2 16*  GLUCOSE 68*  BUN 7  CREATININE 0.38*  CALCIUM 5.8*   Liver Function Tests: Recent Labs  Lab 07/27/19 1012  AST 15  ALT  8  ALKPHOS 30*  BILITOT 0.6  PROT 4.3*  ALBUMIN 2.4*   Recent Labs  Lab 07/27/19 1012  LIPASE 27   No results for input(s): AMMONIA in the last 168 hours. CBC: Recent Labs  Lab 07/27/19 1012  WBC 5.8  NEUTROABS 4.5  HGB 11.0*  HCT 37.1  MCV 100.0  PLT 161   Cardiac Enzymes: No results for input(s): CKTOTAL, CKMB, CKMBINDEX, TROPONINI in the last 168 hours. BNP: Invalid input(s): POCBNP CBG: No results for input(s): GLUCAP in the last 168 hours.  Radiological Exams on Admission:  No results found.    EKG: Independently reviewed.  Rate 83, normal sinus rhythm, low voltage precordial leads   Assessment/Plan Principal Problem:   Intractable nausea and vomiting -Known longstanding history of endometriosis, nausea and vomiting started after NSAIDs, possibly due to esophagitis/gastritis -Placed on IV fluid hydration, Zofran, IV Dilaudid -Full liquid diet as tolerated -Patient declined imaging in ED  Active Problems: Severe hypocalcemia, known history of vitamin D deficiency -Currently asymptomatic, has known history of severe vitamin D deficiency.  Not on any medications which may decrease calcium absorption.  No acute EKG changes -Vitamin D level was severely low 10 in 2/ 2016 -will  check vitamin D level, PTH, magnesium level, urinary calcium level to assess for hypercalciuria -Patient will will need calcium, vitamin D supplementation once levels available. -Status post IV  calcium gluconate 1 g, recheck calcium level    Endometriosis of pelvic peritoneum -Follow outpatient with OB/GYN, last seen by Dr. Mora Bellman     Dehydration -Placed on IV fluid hydration  DVT prophylaxis: Lovenox  CODE STATUS: Full CODE STATUS  Consults called: None  Family Communication: Admission, patients condition and plan of care including tests being ordered have been discussed with the patient who indicates understanding and agree with the plan and Code Status  Admission  status: Telemetry, observation  The medical decision making on this patient was of high complexity and the patient is at high risk for clinical deterioration, therefore this is a level 3 admission.  Severity of Illness:      The appropriate patient status for this patient is OBSERVATION. Observation status is judged to be reasonable and necessary in order to provide the required intensity of service to ensure the patient's safety. The patient's presenting symptoms, physical exam findings, and initial radiographic and laboratory data in the context of their medical condition is felt to place them at decreased risk for further clinical deterioration. Furthermore, it is anticipated that the patient will be medically stable for discharge from the hospital within 2 midnights of admission. The following factors support the patient status of observation.   " The patient's presenting symptoms include intractable nausea and vomiting, hypocalcemia. " The physical exam findings include dehydration, nausea vomiting " The initial radiographic and laboratory data are severely low calcium level     Time Spent on Admission: 41mins      Keirstyn Aydt M.D. [facility] 07/27/2019, 4:29 PM

## 2019-07-27 NOTE — ED Provider Notes (Signed)
[facility] DEPT Provider Note   CSN: [id number] Arrival date & time: 07/27/19  0857     History Chief Complaint  Patient presents with  . Abdominal Pain    Jane [name] is a 34 y.o. female.  The history is provided by the patient and medical records. No language interpreter was used.  Emesis Severity:  Severe Duration:  2 days Timing:  Constant Quality:  Stomach contents Progression:  Unchanged Chronicity:  Recurrent Recent urination:  Normal Relieved by:  Nothing Worsened by:  Nothing Ineffective treatments:  None tried Associated symptoms: abdominal pain   Associated symptoms: no chills, no cough, no diarrhea, no fever, no headaches, no myalgias, no sore throat and no URI        Past Medical History:  Diagnosis Date  . Bladder infection   . Endometriosis   . GERD (gastroesophageal reflux disease)   . Migraine   . UTI (lower urinary tract infection)     Patient Active Problem List   Diagnosis Date Noted  . Vitamin D deficiency 09/09/2014  . Dental cavities 10/09/2013  . Abdominal bloating 10/09/2013  . Right shoulder pain 10/09/2013  . 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 Other and unspecified ovarian cyst 01/09/2011  . Cocaine abuse, unspecified 01/09/2011    Past Surgical History:  Procedure Laterality Date  . UMBILICAL HERNIA REPAIR       OB History   No obstetric history on file.     Family History  Problem Relation Age of Onset  . Hypertension Maternal Grandmother   . Stroke Maternal Grandmother   . Cancer Maternal Grandmother   . Cancer Paternal Grandmother   . Endometriosis Sister     Social History   Tobacco Use  . Smoking status: Never Smoker  . Smokeless tobacco: Never Used  Substance Use Topics  . Alcohol use: No  . Drug use: No    Home Medications Prior to Admission medications   Medication Sig Start Date End Date Taking? Authorizing Provider  acetaminophen  (TYLENOL) 500 MG tablet Take 1,000 mg by mouth every 6 (six) hours as needed for mild pain or headache.     [provider]  ibuprofen (ADVIL,MOTRIN) 800 MG tablet Take 1 tablet (800 mg total) by mouth 3 (three) times daily. 10/15/18   Joy, Shawn C, PA-C  ondansetron (ZOFRAN-ODT) 4 MG disintegrating tablet Take 1 tablet (4 mg total) by mouth every 8 (eight) hours as needed for nausea or vomiting. 05/18/19   Domenic Moras, PA-C  oxyCODONE-acetaminophen (PERCOCET/ROXICET) 5-325 MG tablet Take 1 tablet by mouth every 6 (six) hours as needed. Patient taking differently: Take 1 tablet by mouth every 6 (six) hours as needed for moderate pain.  04/30/19   Constant, Peggy, MD  pantoprazole (PROTONIX) 40 MG tablet Take 1 tablet (40 mg total) by mouth daily. 02/14/19 05/15/19  Thornton Park, MD    Allergies    Toradol [ketorolac tromethamine], Clindamycin/lincomycin, and Vicodin [hydrocodone-acetaminophen]  Review of Systems   Review of Systems  Constitutional: Positive for fatigue. Negative for chills, diaphoresis and fever.  HENT: Negative for congestion and sore throat.   Eyes: Negative for visual disturbance.  Respiratory: Negative for cough, choking, chest tightness, shortness of breath and wheezing.   Cardiovascular: Negative for chest pain and palpitations.  Gastrointestinal: Positive for abdominal pain, nausea and vomiting. Negative for constipation and diarrhea.  Genitourinary: Positive for vaginal bleeding (menstrual sycle. ). Negative for dysuria, flank pain, frequency, vaginal discharge and vaginal pain.  Musculoskeletal: Negative for back pain, myalgias, neck pain and neck stiffness.  Skin: Negative for rash and wound.  Neurological: Negative for weakness, light-headedness, numbness and headaches.  Psychiatric/Behavioral: Negative for agitation.  All other systems reviewed and are negative.   Physical Exam Updated Vital Signs BP 104/62 (BP Location: Right Arm)   Pulse 76   Temp  97.9 F (36.6 C) (Oral)   Resp 17   LMP 07/25/2019   SpO2 100%   Physical Exam Vitals and nursing note reviewed.  Constitutional:      General: She is not in acute distress.    Appearance: She is well-developed. She is not ill-appearing, toxic-appearing or diaphoretic.  HENT:     Head: Normocephalic and atraumatic.     Right Ear: External ear normal.     Left Ear: External ear normal.     Nose: Nose normal.     Mouth/Throat:     Pharynx: No oropharyngeal exudate.  Eyes:     Conjunctiva/sclera: Conjunctivae normal.     Pupils: Pupils are equal, round, and reactive to light.  Cardiovascular:     Rate and Rhythm: Normal rate.     Heart sounds: Normal heart sounds. No murmur.  Pulmonary:     Effort: Pulmonary effort is normal. No respiratory distress.     Breath sounds: No stridor. No wheezing, rhonchi or rales.  Chest:     Chest wall: No tenderness.  Abdominal:     General: Abdomen is flat. Bowel sounds are normal. There is no distension.     Palpations: Abdomen is soft.     Tenderness: There is generalized abdominal tenderness. There is no right CVA tenderness, left CVA tenderness or rebound.  Musculoskeletal:     Cervical back: Normal range of motion and neck supple.  Skin:    General: Skin is warm.     Capillary Refill: Capillary refill takes less than 2 seconds.     Findings: No erythema or rash.  Neurological:     General: No focal deficit present.     Mental Status: She is alert and oriented to person, place, and time.     Motor: No abnormal muscle tone.     Coordination: Coordination normal.     Deep Tendon Reflexes: Reflexes are normal and symmetric.  Psychiatric:        Mood and Affect: Mood normal.     ED Results / Procedures / Treatments   Labs (all labs ordered are listed, but only abnormal results are displayed) Labs Reviewed  CBC WITH DIFFERENTIAL/PLATELET - Abnormal; Notable for the following components:      Result Value   RBC 3.71 (*)     Hemoglobin 11.0 (*)    MCHC 29.6 (*)    All other components within normal limits  COMPREHENSIVE METABOLIC PANEL - Abnormal; Notable for the following components:   Chloride 119 (*)    CO2 16 (*)    Glucose, Bld 68 (*)    Creatinine, Ser 0.38 (*)    Calcium 5.8 (*)    Total Protein 4.3 (*)    Albumin 2.4 (*)    Alkaline Phosphatase 30 (*)    Anion gap 4 (*)    All other components within normal limits  URINALYSIS, ROUTINE W REFLEX MICROSCOPIC - Abnormal; Notable for the following components:   Hgb urine dipstick LARGE (*)    Bacteria, UA RARE (*)    All other components within normal limits  RESPIRATORY PANEL BY RT PCR (FLU A&B, COVID)  URINE CULTURE  LIPASE, BLOOD  VITAMIN D 25 HYDROXY (VIT D DEFICIENCY, FRACTURES)  MAGNESIUM  CALCIUM / CREATININE RATIO, URINE  CALCIUM  PARATHYROID HORMONE, INTACT (NO CA)  HIV ANTIBODY (ROUTINE TESTING W REFLEX)  CBC  COMPREHENSIVE METABOLIC PANEL  POC URINE PREG, ED    EKG EKG Interpretation  Date/Time:  Friday July 27 2019 11:05:05 EST Ventricular Rate:  83 PR Interval:    QRS Duration: 90 QT Interval:  342 QTC Calculation: 402 R Axis:   19 Text Interpretation: Sinus rhythm Low voltage, precordial leads Abnormal R-wave progression, early transition When compared to prior, no significant changes seen. No STEMI Confirmed by Theda Belfast ([id number]) on 07/27/2019 11:10:41 AM   Radiology No results found.  Procedures Procedures (including critical care time)  Medications Ordered in ED Medications  0.9 %  sodium chloride infusion ( Intravenous New Bag/Given 07/27/19 1706)  ondansetron (ZOFRAN) injection 4 mg (has no administration in time range)  HYDROmorphone (DILAUDID) injection 1 mg (1 mg Intravenous Given 07/27/19 1721)  pantoprazole (PROTONIX) EC tablet 40 mg (has no administration in time range)  oxyCODONE-acetaminophen (PERCOCET/ROXICET) 5-325 MG per tablet 1 tablet (has no administration in time range)  enoxaparin  (LOVENOX) injection 40 mg (has no administration in time range)  senna-docusate (Senokot-S) tablet 1 tablet (has no administration in time range)  sodium chloride 0.9 % bolus 1,000 mL (0 mLs Intravenous Stopped 07/27/19 1224)  ondansetron (ZOFRAN) injection 4 mg (4 mg Intravenous Given 07/27/19 1008)  fentaNYL (SUBLIMAZE) injection 50 mcg (50 mcg Intravenous Given 07/27/19 1011)  calcium gluconate 1 g/ 50 mL sodium chloride IVPB (0 g Intravenous Stopped 07/27/19 1225)  fentaNYL (SUBLIMAZE) injection 50 mcg (50 mcg Intravenous Given 07/27/19 1233)  ondansetron (ZOFRAN) injection 4 mg (4 mg Intravenous Given 07/27/19 1233)  fentaNYL (SUBLIMAZE) injection 50 mcg (50 mcg Intravenous Given 07/27/19 1516)  ondansetron (ZOFRAN) injection 4 mg (4 mg Intravenous Given 07/27/19 1517)  sodium chloride 0.9 % bolus 1,000 mL (1,000 mLs Intravenous New Bag/Given 07/27/19 1517)    ED Course  I have reviewed the triage vital signs and the nursing notes.  Pertinent labs & imaging results that were available during my care of the patient were reviewed by me and considered in my medical decision making (see chart for details).    MDM Rules/Calculators/A&P                      Jane [name] is a 34 y.o. female with a past medical history significant for severe mental endometriosis pain, prior UTIs, and GERD who presents with abdominal pain, nausea, vomiting, intolerance of p.o.  Patient reports that she has known endometriosis who has severe abdominal pain with nausea and vomiting every month when her menstrual cycle begins.  She reports her menstrual cycle started yesterday and that is when her pain began.  She was using home medications including Percocet, Tylenol, and ibuprofen but then overnight started having nausea and vomiting and she would not keep her medicines down.  When this happens, she reports she is to come in to get rehydrated and medications before she is able to go home.  She reports no  new trauma.  She reports this pain is the exact same as her endometriosis pain all over her abdomen and is very severe.  She denies any new pelvic pain, fevers, chills, respiratory symptoms, congestion, cough, or Covid contacts.  She reports she does not have any urinary symptoms but has had UTIs in the  past.  She was given some fluids, fentanyl, and Zofran with EMS with some mild improvement in symptoms.  On exam, patient is still having nausea and some dry heaving.  She has tenderness across her abdomen with normal bowel sounds.  Back and flanks nontender.  Lungs clear.  Chest nontender.  No murmur.  Had a shared decision-making conversation with patient about work-up.  Given her history of the exact same symptoms, we agreed to hold on imaging at this time.  She is agreeable to getting labs, pain medicine, nausea medicine, and fluids.  She also does not feel that a pelvic exam would be as helpful at this time as her menstrual cycle is normal for her and she denies any vaginal discharge.  This was felt to be reasonable at this time.  Patient given fluids, some more fentanyl and Zofran.  She will have some labs to look for electrolyte imbalance or dehydration.  Patient is feeling better, anticipate discharge home and we discussed a new prescription for a suppository type nausea medication such as Phenergan.  Patient will be amenable to considering this to help prevent her from having to come to emergency department if she can keep her pain medicines down.  Anticipate reassessment after rehydration and work-up.  Patient was found to have hypocalcemia with a calcium of 5.8.  This is new.  She was given IV calcium gluconate to see if this will help her symptoms.   2:53 PM  Other than the hypocalcemia, work-up is otherwise reassuring.  Patient continues to have severe abdominal pain nausea and vomiting after work-up.  She is now also slightly hypotensive with pressures in the 90s.  She will given more  fluids as well as pain and nausea medicine.  She received some IV calcium.  As she still feels worse and has a soft pressures, will call for admission for rehydration and further calcium management and work-up as to the etiology of this.  Patient agrees to admission for further management.   Final Clinical Impression(s) / ED Diagnoses Final diagnoses:  Generalized abdominal pain  Hypocalcemia  Nausea and vomiting, intractability of vomiting not specified, unspecified vomiting type    Rx / DC Orders ED Discharge Orders    None     Clinical Impression: 1. Generalized abdominal pain   2. Hypocalcemia   3. Nausea and vomiting, intractability of vomiting not specified, unspecified vomiting type     Disposition: Admit  This note was prepared with assistance of Dragon voice recognition software. Occasional wrong-word or sound-a-like substitutions may have occurred due to the inherent limitations of voice recognition software.     Kaycen Whitworth, Canary Brim, MD 07/27/19 1730

## 2019-07-28 ENCOUNTER — Observation Stay (HOSPITAL_COMMUNITY): Payer: BC Managed Care – PPO

## 2019-07-28 DIAGNOSIS — Z8249 Family history of ischemic heart disease and other diseases of the circulatory system: Secondary | ICD-10-CM | POA: Diagnosis not present

## 2019-07-28 DIAGNOSIS — E559 Vitamin D deficiency, unspecified: Secondary | ICD-10-CM | POA: Diagnosis present

## 2019-07-28 DIAGNOSIS — N803 Endometriosis of pelvic peritoneum: Secondary | ICD-10-CM | POA: Diagnosis present

## 2019-07-28 DIAGNOSIS — I959 Hypotension, unspecified: Secondary | ICD-10-CM | POA: Diagnosis present

## 2019-07-28 DIAGNOSIS — K219 Gastro-esophageal reflux disease without esophagitis: Secondary | ICD-10-CM | POA: Diagnosis present

## 2019-07-28 DIAGNOSIS — E860 Dehydration: Secondary | ICD-10-CM | POA: Diagnosis present

## 2019-07-28 DIAGNOSIS — Z881 Allergy status to other antibiotic agents status: Secondary | ICD-10-CM | POA: Diagnosis not present

## 2019-07-28 DIAGNOSIS — R112 Nausea with vomiting, unspecified: Secondary | ICD-10-CM | POA: Diagnosis present

## 2019-07-28 DIAGNOSIS — Z20822 Contact with and (suspected) exposure to covid-19: Secondary | ICD-10-CM | POA: Diagnosis present

## 2019-07-28 DIAGNOSIS — Z823 Family history of stroke: Secondary | ICD-10-CM | POA: Diagnosis not present

## 2019-07-28 DIAGNOSIS — T39395A Adverse effect of other nonsteroidal anti-inflammatory drugs [NSAID], initial encounter: Secondary | ICD-10-CM | POA: Diagnosis present

## 2019-07-28 DIAGNOSIS — K5900 Constipation, unspecified: Secondary | ICD-10-CM | POA: Diagnosis present

## 2019-07-28 DIAGNOSIS — Z79899 Other long term (current) drug therapy: Secondary | ICD-10-CM | POA: Diagnosis not present

## 2019-07-28 DIAGNOSIS — N946 Dysmenorrhea, unspecified: Secondary | ICD-10-CM | POA: Diagnosis present

## 2019-07-28 DIAGNOSIS — R1084 Generalized abdominal pain: Secondary | ICD-10-CM | POA: Diagnosis not present

## 2019-07-28 DIAGNOSIS — Z885 Allergy status to narcotic agent status: Secondary | ICD-10-CM | POA: Diagnosis not present

## 2019-07-28 LAB — URINE CULTURE: Culture: >=10000 — AB

## 2019-07-28 LAB — COMPREHENSIVE METABOLIC PANEL
ALT: 11 U/L (ref 0–44)
AST: 11 U/L — ABNORMAL LOW (ref 15–41)
Albumin: 3 g/dL — ABNORMAL LOW (ref 3.5–5.0)
Alkaline Phosphatase: 38 U/L (ref 38–126)
Anion gap: 8 (ref 5–15)
BUN: <5 mg/dL — ABNORMAL LOW (ref 6–20)
CO2: 21 mmol/L — ABNORMAL LOW (ref 22–32)
Calcium: 7.9 mg/dL — ABNORMAL LOW (ref 8.9–10.3)
Chloride: 108 mmol/L (ref 98–111)
Creatinine, Ser: 0.54 mg/dL (ref 0.44–1.00)
GFR calc Af Amer: >60 mL/min (ref >60)
GFR calc non Af Amer: >60 mL/min (ref >60)
Glucose, Bld: 79 mg/dL (ref 70–99)
Potassium: 3.5 mmol/L (ref 3.5–5.1)
Sodium: 137 mmol/L (ref 135–145)
Total Bilirubin: 1.3 mg/dL — ABNORMAL HIGH (ref 0.3–1.2)
Total Protein: 5.5 g/dL — ABNORMAL LOW (ref 6.5–8.1)

## 2019-07-28 LAB — CBC
HCT: 31.7 % — ABNORMAL LOW (ref 36.0–46.0)
Hemoglobin: 9.7 g/dL — ABNORMAL LOW (ref 12.0–15.0)
MCH: 29.7 pg (ref 26.0–34.0)
MCHC: 30.6 g/dL (ref 30.0–36.0)
MCV: 96.9 fL (ref 80.0–100.0)
Platelets: 185 10*3/uL (ref 150–400)
RBC: 3.27 MIL/uL — ABNORMAL LOW (ref 3.87–5.11)
RDW: 12.7 % (ref 11.5–15.5)
WBC: 4.7 10*3/uL (ref 4.0–10.5)
nRBC: 0 % (ref 0.0–0.2)

## 2019-07-28 LAB — PARATHYROID HORMONE, INTACT (NO CA): PTH: 29 pg/mL (ref 15–65)

## 2019-07-28 LAB — HIV ANTIBODY (ROUTINE TESTING W REFLEX): HIV Screen 4th Generation wRfx: NONREACTIVE

## 2019-07-28 MED ORDER — LACTULOSE 10 GM/15ML PO SOLN
20.0000 g | ORAL | Status: AC
  Administered 2019-07-28: 20 g via ORAL
  Filled 2019-07-28: qty 30

## 2019-07-28 MED ORDER — MAGNESIUM CITRATE PO SOLN
1.0000 | Freq: Once | ORAL | Status: AC
  Administered 2019-07-28: 1 via ORAL
  Filled 2019-07-28: qty 296

## 2019-07-28 MED ORDER — PROCHLORPERAZINE EDISYLATE 10 MG/2ML IJ SOLN
10.0000 mg | INTRAMUSCULAR | Status: DC | PRN
  Administered 2019-07-28: 10 mg via INTRAVENOUS
  Filled 2019-07-28: qty 2

## 2019-07-28 MED ORDER — MAGNESIUM SULFATE 2 GM/50ML IV SOLN
2.0000 g | Freq: Once | INTRAVENOUS | Status: AC
  Administered 2019-07-28: 2 g via INTRAVENOUS

## 2019-07-28 MED ORDER — BISACODYL 10 MG RE SUPP: 10.0000 mg | Freq: Every day | RECTAL | Status: DC | PRN

## 2019-07-28 NOTE — Progress Notes (Signed)
PROGRESS NOTE    Jane [name]  WVP:[id number] DOB: Jan 07, 1986 DOA: 07/27/2019 PCP: Leilani Able, MD    Brief Narrative:  34 year old female with longstanding history of endometriosis, follows outpatient OB/GYN, menstrual cramping, GERD presented with abdominal pain, nausea and vomiting since yesterday.  Patient reported that she started her menstrual cycle yesterday, subsequently she started having generalized abdominal pain, worse in lower quadrants, typical of endometriosis and menstrual cramps.  Around midnight, she took 800 mg of ibuprofen, and then started having nausea and vomiting x1.  Per patient she has not been able to hold anything down due to nausea and dry heaving.  No fevers or chills.  Patient ports that pain was not improving with medications at home. In ED,  BP was noticed to be low 89/49 and in low 90s Lab work showed severely low calcium 5.8, TRH was requested for admission  Assessment & Plan:   Principal Problem:   Intractable nausea and vomiting Active Problems:   Endometriosis of pelvic peritoneum   Vitamin D deficiency   Hypocalcemia   Dehydration  Principal Problem:   Intractable nausea and vomiting -Known longstanding history of endometriosis, nausea and vomiting started after NSAIDs -No imaging done at time of presentation. Xray abd ordered and personally reviewed. Findings of stool in descending and hepatic flexures -Pt reports long history of constipation for many years -currently on IV fluid hydration, Zofran, IV Dilaudid -Advanced diet as tolerated -Will give trial of lactulose  Active Problems: Severe hypocalcemia, known history of vitamin D deficiency -Currently asymptomatic, has known history of severe vitamin D deficiency.  Not on any medications which may decrease calcium absorption. -Vitamin D level was severely low 10 in 2/ 2016 -will check vitamin D level, PTH, magnesium level, urinary calcium level to assess for  hypercalciuria -Patient will will need calcium, vitamin D supplementation once levels available. -Status post IV  calcium gluconate 1 g, recheck calcium level    Endometriosis of pelvic peritoneum -Follow outpatient with OB/GYN, last seen by Dr. Catalina Antigua -Seems stable at this time     Dehydration -Placed on IV fluid hydration as tolerated  DVT prophylaxis: lovenox subq Code Status: Full Family Communication: Pt in room, family not at bedside Disposition Plan: Possible home in 24hrs  Consultants:     Procedures:     Antimicrobials: Anti-infectives (From admission, onward)   None       Subjective: Eager to advance diet  Objective: Vitals:   07/27/19 1500 07/27/19 1657 07/27/19 2114 07/28/19 0433  BP: 93/63 116/80 110/60 103/64  Pulse: 78 89 79 83  Resp: 15 14 14 15   Temp:  98.1 F (36.7 C) 98.5 F (36.9 C) 98.7 F (37.1 C)  TempSrc:  Oral Oral Oral  SpO2: 100% 100% 100% 99%  Weight:  54.4 kg    Height:  5\' 7"  (1.702 m)      Intake/Output Summary (Last 24 hours) at 07/28/2019 1520 Last data filed at 07/28/2019 0556 Gross per 24 hour  Intake 1604.81 ml  Output 1250 ml  Net 354.81 ml   Filed Weights   07/27/19 1657  Weight: 54.4 kg    Examination:  General exam: Appears calm and comfortable  Respiratory system: Clear to auscultation. Respiratory effort normal. Cardiovascular system: S1 & S2 heard, Regular Gastrointestinal system: mildly distended, decreased BS Central nervous system: Alert and oriented. No focal neurological deficits. Extremities: Symmetric 5 x 5 power. Skin: No rashes, lesions  Psychiatry: Judgement and insight appear normal. Mood & affect appropriate.  Data Reviewed: I have personally reviewed following labs and imaging studies  CBC: Recent Labs  Lab 07/27/19 1012 07/28/19 0538  WBC 5.8 4.7  NEUTROABS 4.5  --   HGB 11.0* 9.7*  HCT 37.1 31.7*  MCV 100.0 96.9  PLT 161 295   Basic Metabolic Panel: Recent Labs   Lab 07/27/19 1012 07/27/19 1711 07/28/19 0538  NA 139  --  137  K 3.7  --  3.5  CL 119*  --  108  CO2 16*  --  21*  GLUCOSE 68*  --  79  BUN 7  --  <5*  CREATININE 0.38*  --  0.54  CALCIUM 5.8* 8.2* 7.9*  MG  --  1.6*  --    GFR: Estimated Creatinine Clearance: 83.5 mL/min (by C-G formula based on SCr of 0.54 mg/dL). Liver Function Tests: Recent Labs  Lab 07/27/19 1012 07/28/19 0538  AST 15 11*  ALT 8 11  ALKPHOS 30* 38  BILITOT 0.6 1.3*  PROT 4.3* 5.5*  ALBUMIN 2.4* 3.0*   Recent Labs  Lab 07/27/19 1012  LIPASE 27   No results for input(s): AMMONIA in the last 168 hours. Coagulation Profile: No results for input(s): INR, PROTIME in the last 168 hours. Cardiac Enzymes: No results for input(s): CKTOTAL, CKMB, CKMBINDEX, TROPONINI in the last 168 hours. BNP (last 3 results) No results for input(s): PROBNP in the last 8760 hours. HbA1C: No results for input(s): HGBA1C in the last 72 hours. CBG: No results for input(s): GLUCAP in the last 168 hours. Lipid Profile: No results for input(s): CHOL, HDL, LDLCALC, TRIG, CHOLHDL, LDLDIRECT in the last 72 hours. Thyroid Function Tests: No results for input(s): TSH, T4TOTAL, FREET4, T3FREE, THYROIDAB in the last 72 hours. Anemia Panel: No results for input(s): VITAMINB12, FOLATE, FERRITIN, TIBC, IRON, RETICCTPCT in the last 72 hours. Sepsis Labs: No results for input(s): PROCALCITON, LATICACIDVEN in the last 168 hours.  Recent Results (from the past 240 hour(s))  Urine culture     Status: Abnormal   Collection Time: 07/27/19 10:40 AM   Specimen: Urine, Clean Catch  Result Value Ref Range Status   Specimen Description   Final    URINE, CLEAN CATCH Performed at [facility], [place] [address] [place], [place] [address]    Special Requests   Final    NONE Performed at [facility], [place] [address] [place], [place] [address]    Culture (A)  Final    <10,000 COLONIES/mL >=100,000  COLONIES/mL Performed at [place] [address] [place], [place] [address]    Report Status 07/28/2019 FINAL  Final  Respiratory Panel by RT PCR (Flu A&B, Covid) - Nasopharyngeal Swab     Status: None   Collection Time: 07/27/19  3:22 PM   Specimen: Nasopharyngeal Swab  Result Value Ref Range Status   SARS Coronavirus 2 by RT PCR NEGATIVE NEGATIVE Final    Comment: (NOTE) SARS-CoV-2 target nucleic acids are NOT DETECTED. The SARS-CoV-2 RNA is generally detectable in upper respiratoy specimens during the acute phase of infection. The lowest concentration of SARS-CoV-2 viral copies this assay can detect is 131 copies/mL. A negative result does not preclude SARS-Cov-2 infection and should not be used as the sole basis for treatment or other patient management decisions. A negative result may occur with  improper specimen collection/handling, submission of specimen other than nasopharyngeal swab, presence of viral mutation(s) within the areas targeted by this assay, and inadequate number of viral  copies (<131 copies/mL). A negative result must be combined with clinical observations, patient history, and epidemiological information. The expected result is Negative. Fact Sheet for Patients:  https://www.moore.com/ Fact Sheet for Healthcare Providers:  https://www.young.biz/ This test is not yet ap proved or cleared by the [place] FDA and  has been authorized for detection and/or diagnosis of SARS-CoV-2 by FDA under an Emergency Use Authorization (EUA). This EUA will remain  in effect (meaning this test can be used) for the duration of the COVID-19 declaration under Section 564(b)(1) of the Act, 21 U.S.C. section 360bbb-3(b)(1), unless the authorization is terminated or revoked sooner.    Influenza A by PCR NEGATIVE NEGATIVE Final   Influenza B by PCR NEGATIVE NEGATIVE Final    Comment: (NOTE) The Xpert Xpress SARS-CoV-2/FLU/RSV  assay is intended as an aid in  the diagnosis of influenza from Nasopharyngeal swab specimens and  should not be used as a sole basis for treatment. Nasal washings and  aspirates are unacceptable for Xpert Xpress SARS-CoV-2/FLU/RSV  testing. Fact Sheet for Patients: https://www.moore.com/ Fact Sheet for Healthcare Providers: https://www.young.biz/ This test is not yet approved or cleared by the [place] FDA and  has been authorized for detection and/or diagnosis of SARS-CoV-2 by  FDA under an Emergency Use Authorization (EUA). This EUA will remain  in effect (meaning this test can be used) for the duration of the  Covid-19 declaration under Section 564(b)(1) of the Act, 21  U.S.C. section 360bbb-3(b)(1), unless the authorization is  terminated or revoked. Performed at [facility], [address], [place], [place] [address]      Radiology Studies: DG Abd 1 View  Result Date: 07/28/2019 CLINICAL DATA:  Abdominal pain and distention. EXAM: ABDOMEN - 1 VIEW COMPARISON:  None. FINDINGS: The bowel gas pattern is normal. No radio-opaque calculi or other significant radiographic abnormality are seen. IMPRESSION: Negative. Electronically Signed   By: Lupita Raider M.D.   On: 07/28/2019 14:14    Scheduled Meds: . enoxaparin (LOVENOX) injection  40 mg Subcutaneous Q24H  . lactulose  20 g Oral NOW  . pantoprazole  40 mg Oral Daily   Continuous Infusions: . sodium chloride 125 mL/hr at 07/28/19 0824     LOS: 0 days   Rickey Barbara, MD [facility] Pager On Amion  If 7PM-7AM, please contact night-coverage 07/28/2019, 3:20 PM

## 2019-07-29 LAB — CBC
HCT: 32.4 % — ABNORMAL LOW (ref 36.0–46.0)
Hemoglobin: 10.2 g/dL — ABNORMAL LOW (ref 12.0–15.0)
MCH: 29.9 pg (ref 26.0–34.0)
MCHC: 31.5 g/dL (ref 30.0–36.0)
MCV: 95 fL (ref 80.0–100.0)
Platelets: 169 10*3/uL (ref 150–400)
RBC: 3.41 MIL/uL — ABNORMAL LOW (ref 3.87–5.11)
RDW: 12.5 % (ref 11.5–15.5)
WBC: 3.6 10*3/uL — ABNORMAL LOW (ref 4.0–10.5)
nRBC: 0 % (ref 0.0–0.2)

## 2019-07-29 LAB — COMPREHENSIVE METABOLIC PANEL
ALT: 12 U/L (ref 0–44)
AST: 14 U/L — ABNORMAL LOW (ref 15–41)
Albumin: 3.2 g/dL — ABNORMAL LOW (ref 3.5–5.0)
Alkaline Phosphatase: 39 U/L (ref 38–126)
Anion gap: 7 (ref 5–15)
BUN: <5 mg/dL — ABNORMAL LOW (ref 6–20)
CO2: 23 mmol/L (ref 22–32)
Calcium: 8.1 mg/dL — ABNORMAL LOW (ref 8.9–10.3)
Chloride: 108 mmol/L (ref 98–111)
Creatinine, Ser: 0.55 mg/dL (ref 0.44–1.00)
GFR calc Af Amer: >60 mL/min (ref >60)
GFR calc non Af Amer: >60 mL/min (ref >60)
Glucose, Bld: 83 mg/dL (ref 70–99)
Potassium: 3.6 mmol/L (ref 3.5–5.1)
Sodium: 138 mmol/L (ref 135–145)
Total Bilirubin: 1.2 mg/dL (ref 0.3–1.2)
Total Protein: 5.9 g/dL — ABNORMAL LOW (ref 6.5–8.1)

## 2019-07-29 LAB — CALCIUM / CREATININE RATIO, URINE
Calcium, Ur: 8.9 mg/dL
Calcium/Creat.Ratio: 118 mg/g creat (ref 29–442)
Creatinine, Urine: 75.3 mg/dL

## 2019-07-29 MED ORDER — DOCUSATE SODIUM 100 MG PO CAPS: 100.0000 mg | ORAL_CAPSULE | Freq: Two times a day (BID) | ORAL | 0 refills | Status: AC

## 2019-07-29 MED ORDER — VITAMIN D 125 MCG (5000 UT) PO CAPS: 1.0000 | ORAL_CAPSULE | Freq: Every day | ORAL | 0 refills | Status: AC

## 2019-07-29 MED ORDER — POLYETHYLENE GLYCOL 3350 17 G PO PACK: 17.0000 g | PACK | Freq: Every day | ORAL | 0 refills | Status: DC

## 2019-07-29 MED ORDER — POTASSIUM CHLORIDE CRYS ER 20 MEQ PO TBCR: 40.0000 meq | EXTENDED_RELEASE_TABLET | Freq: Once | ORAL | Status: DC

## 2019-07-29 MED ORDER — ACETAMINOPHEN 500 MG PO TABS
1000.0000 mg | ORAL_TABLET | Freq: Four times a day (QID) | ORAL | Status: DC | PRN
  Administered 2019-07-29: 1000 mg via ORAL
  Filled 2019-07-29: qty 2

## 2019-07-29 MED ORDER — PROCHLORPERAZINE MALEATE 5 MG PO TABS: 5.0000 mg | ORAL_TABLET | Freq: Four times a day (QID) | ORAL | 0 refills | Status: DC | PRN

## 2019-07-29 MED ORDER — IBUPROFEN 200 MG PO TABS
600.0000 mg | ORAL_TABLET | Freq: Four times a day (QID) | ORAL | Status: DC | PRN
  Administered 2019-07-29: 600 mg via ORAL
  Filled 2019-07-29: qty 3

## 2019-07-29 NOTE — Discharge Summary (Signed)
Physician Discharge Summary  Jane [name] FOY:[id number] DOB: 1986/01/26 DOA: 07/27/2019  PCP: Leilani Able, MD  Admit date: 07/27/2019 Discharge date: 07/29/2019  Admitted From: Home Disposition:  Home  Recommendations for Outpatient Follow-up:  1. Follow up with PCP in 1-2 weeks 2. Follow up with OB/GYN as scheduled  Discharge Condition:Stable CODE STATUS:Full Diet recommendation: Regular as tolerated   Brief/Interim Summary: 34 year old female with longstanding history of endometriosis, follows outpatient OB/GYN, menstrual cramping, GERD presented with abdominal pain, nausea and vomiting since yesterday. Patient reported that she started her menstrual cycle yesterday, subsequently she started having generalized abdominal pain, worse in lower quadrants, typical of endometriosis and menstrual cramps. Around midnight, she took 800 mg of ibuprofen, and then started having nausea and vomiting x1. Per patient she has not been able to hold anything down due to nausea and dry heaving. No fevers or chills. Patient ports that pain was not improving with medications at home. In ED,BP was noticed to be low 89/49 and in low 90s Lab work showed severely low calcium 5.8, TRH was requested for admission  Discharge Diagnoses:  Principal Problem:   Intractable nausea and vomiting Active Problems:   Endometriosis of pelvic peritoneum   Vitamin D deficiency   Hypocalcemia   Dehydration  Principal Problem: Intractable nausea and vomiting -Known longstanding history of endometriosis, nausea and vomiting started after NSAIDs -No imaging done at time of presentation. Xray abd ordered and personally reviewed. Findings of stool in descending and hepatic flexures -Pt reports long history of constipation for many years -results noted after mg citrate -Advanced diet as tolerated  Active Problems: Severe hypocalcemia, known history of vitamin D deficiency -Currently  asymptomatic, has known history of severe vitamin D deficiency. Not on any medications which may decrease calcium absorption. -Vitamin D level was severely low 10in 08/2014 -Status postIVcalcium gluconate 1 g, recheck calcium level -Prescribed vit D supplementation on d/c  Endometriosis of pelvic peritoneum -Follow outpatient with OB/GYN, last seen by Dr. Rudi Coco -Complaining of abd pain and requesting tylenol with motrin -Chart reviewed. Pt has known hx of abd discomfort notably worse around time of menstral cycle -Abd xray per above. Prior CT abd reviewed. Findings of endometriosis noted  Dehydration -Placed on IV fluid hydration as tolerated  Discharge Instructions   Allergies as of 07/29/2019      Reactions   Toradol [ketorolac Tromethamine] Anxiety   suicidal   Clindamycin/lincomycin Hives   Vicodin [hydrocodone-acetaminophen] Nausea And Vomiting      Medication List    TAKE these medications   acetaminophen 500 MG tablet Commonly known as: TYLENOL Take 1,000 mg by mouth every 6 (six) hours as needed for moderate pain (Cramps).   ibuprofen 800 MG tablet Commonly known as: ADVIL Take 1 tablet (800 mg total) by mouth 3 (three) times daily. What changed:   when to take this  reasons to take this   ondansetron 4 MG disintegrating tablet Commonly known as: Zofran ODT Take 1 tablet (4 mg total) by mouth every 8 (eight) hours as needed for nausea or vomiting.   oxyCODONE-acetaminophen 5-325 MG tablet Commonly known as: PERCOCET/ROXICET Take 1 tablet by mouth every 6 (six) hours as needed. What changed: reasons to take this   pantoprazole 40 MG tablet Commonly known as: PROTONIX Take 1 tablet (40 mg total) by mouth daily.   polyethylene glycol 17 g packet Commonly known as: MiraLax Take 17 g by mouth daily.   prochlorperazine 5 MG tablet Commonly known as: COMPAZINE Take 1  tablet (5 mg total) by mouth every 6 (six) hours as needed for nausea  or vomiting.   Vitamin D 125 MCG (5000 UT) Caps Take 1 capsule by mouth daily.      Follow-up Information    Lin Landsman, MD. Schedule an appointment as soon as possible for a visit in 1 week(s).   Specialty: Family Medicine Contact information: [place] [address] [phone]          Allergies  Allergen Reactions  . Toradol [Ketorolac Tromethamine] Anxiety    suicidal  . Clindamycin/Lincomycin Hives  . Vicodin [Hydrocodone-Acetaminophen] Nausea And Vomiting    Procedures/Studies: DG Abd 1 View  Result Date: 07/28/2019 CLINICAL DATA:  Abdominal pain and distention. EXAM: ABDOMEN - 1 VIEW COMPARISON:  None. FINDINGS: The bowel gas pattern is normal. No radio-opaque calculi or other significant radiographic abnormality are seen. IMPRESSION: Negative. Electronically Signed   By: Marijo Conception M.D.   On: 07/28/2019 14:14     Subjective: Tolerating diet  Discharge Exam: Vitals:   07/29/19 0503 07/29/19 1220  BP: (!) 101/56 (!) 105/59  Pulse: 75 79  Resp:  19  Temp: 98.3 F (36.8 C) 98.2 F (36.8 C)  SpO2: 96% 99%   Vitals:   07/28/19 2028 07/29/19 0031 07/29/19 0503 07/29/19 1220  BP: (!) 106/52 106/72 (!) 101/56 (!) 105/59  Pulse: 72  75 79  Resp: 14   19  Temp: 98.7 F (37.1 C)  98.3 F (36.8 C) 98.2 F (36.8 C)  TempSrc: Oral  Oral Oral  SpO2: 98%  96% 99%  Weight:      Height:        General: Pt is alert, awake, not in acute distress Cardiovascular: RRR, S1/S2 +, no rubs, no gallops Respiratory: CTA bilaterally, no wheezing, no rhonchi Abdominal: Soft, NT, ND, bowel sounds + Extremities: no edema, no cyanosis   The results of significant diagnostics from this hospitalization (including imaging, microbiology, ancillary and laboratory) are listed below for reference.     Microbiology: Recent Results (from the past 240 hour(s))  Urine culture     Status: Abnormal   Collection Time: 07/27/19 10:40 AM   Specimen: Urine,  Clean Catch  Result Value Ref Range Status   Specimen Description   Final    URINE, CLEAN CATCH Performed at [facility], [place] [address], [place], [place] [address]    Special Requests   Final    NONE Performed at [facility], [place] [address], [place], [place] [address]    Culture (A)  Final    <10,000 COLONIES/mL >=100,000 COLONIES/mL Performed at [place] [address] [place], [place] [address]    Report Status 07/28/2019 FINAL  Final  Respiratory Panel by RT PCR (Flu A&B, Covid) - Nasopharyngeal Swab     Status: None   Collection Time: 07/27/19  3:22 PM   Specimen: Nasopharyngeal Swab  Result Value Ref Range Status   SARS Coronavirus 2 by RT PCR NEGATIVE NEGATIVE Final    Comment: (NOTE) SARS-CoV-2 target nucleic acids are NOT DETECTED. The SARS-CoV-2 RNA is generally detectable in upper respiratoy specimens during the acute phase of infection. The lowest concentration of SARS-CoV-2 viral copies this assay can detect is 131 copies/mL. A negative result does not preclude SARS-Cov-2 infection and should not be used as the sole basis for treatment or other patient management decisions. A negative result may occur with  improper specimen collection/handling, submission of  specimen other than nasopharyngeal swab, presence of viral mutation(s) within the areas targeted by this assay, and inadequate number of viral copies (<131 copies/mL). A negative result must be combined with clinical observations, patient history, and epidemiological information. The expected result is Negative. Fact Sheet for Patients:  https://www.moore.com/ Fact Sheet for Healthcare Providers:  https://www.young.biz/ This test is not yet ap proved or cleared by the [place] FDA and  has been authorized for detection and/or diagnosis of SARS-CoV-2 by FDA under an Emergency Use Authorization (EUA). This EUA will  remain  in effect (meaning this test can be used) for the duration of the COVID-19 declaration under Section 564(b)(1) of the Act, 21 U.S.C. section 360bbb-3(b)(1), unless the authorization is terminated or revoked sooner.    Influenza A by PCR NEGATIVE NEGATIVE Final   Influenza B by PCR NEGATIVE NEGATIVE Final    Comment: (NOTE) The Xpert Xpress SARS-CoV-2/FLU/RSV assay is intended as an aid in  the diagnosis of influenza from Nasopharyngeal swab specimens and  should not be used as a sole basis for treatment. Nasal washings and  aspirates are unacceptable for Xpert Xpress SARS-CoV-2/FLU/RSV  testing. Fact Sheet for Patients: https://www.moore.com/ Fact Sheet for Healthcare Providers: https://www.young.biz/ This test is not yet approved or cleared by the [place] FDA and  has been authorized for detection and/or diagnosis of SARS-CoV-2 by  FDA under an Emergency Use Authorization (EUA). This EUA will remain  in effect (meaning this test can be used) for the duration of the  Covid-19 declaration under Section 564(b)(1) of the Act, 21  U.S.C. section 360bbb-3(b)(1), unless the authorization is  terminated or revoked. Performed at [facility], [address], [place], [place] [address]      Labs: BNP (last 3 results) No results for input(s): BNP in the last 8760 hours. Basic Metabolic Panel: Recent Labs  Lab 07/27/19 1012 07/27/19 1711 07/28/19 0538 07/29/19 0509  NA 139  --  137 138  K 3.7  --  3.5 3.6  CL 119*  --  108 108  CO2 16*  --  21* 23  GLUCOSE 68*  --  79 83  BUN 7  --  <5* <5*  CREATININE 0.38*  --  0.54 0.55  CALCIUM 5.8* 8.2* 7.9* 8.1*  MG  --  1.6*  --   --    Liver Function Tests: Recent Labs  Lab 07/27/19 1012 07/28/19 0538 07/29/19 0509  AST 15 11* 14*  ALT 8 11 12   ALKPHOS 30* 38 39  BILITOT 0.6 1.3* 1.2  PROT 4.3* 5.5* 5.9*  ALBUMIN 2.4* 3.0* 3.2*   Recent Labs  Lab  07/27/19 1012  LIPASE 27   No results for input(s): AMMONIA in the last 168 hours. CBC: Recent Labs  Lab 07/27/19 1012 07/28/19 0538 07/29/19 0509  WBC 5.8 4.7 3.6*  NEUTROABS 4.5  --   --   HGB 11.0* 9.7* 10.2*  HCT 37.1 31.7* 32.4*  MCV 100.0 96.9 95.0  PLT 161 185 169   Cardiac Enzymes: No results for input(s): CKTOTAL, CKMB, CKMBINDEX, TROPONINI in the last 168 hours. BNP: Invalid input(s): POCBNP CBG: No results for input(s): GLUCAP in the last 168 hours. D-Dimer No results for input(s): DDIMER in the last 72 hours. Hgb A1c No results for input(s): HGBA1C in the last 72 hours. Lipid Profile No results for input(s): CHOL, HDL, LDLCALC, TRIG, CHOLHDL, LDLDIRECT in the last 72 hours. Thyroid function studies No results for input(s): TSH, T4TOTAL, T3FREE, THYROIDAB  in the last 72 hours.  Invalid input(s): FREET3 Anemia work up No results for input(s): VITAMINB12, FOLATE, FERRITIN, TIBC, IRON, RETICCTPCT in the last 72 hours. Urinalysis    Component Value Date/Time   COLORURINE YELLOW 07/27/2019 1040   APPEARANCEUR CLEAR 07/27/2019 1040   LABSPEC 1.014 07/27/2019 1040   PHURINE 6.0 07/27/2019 1040   GLUCOSEU NEGATIVE 07/27/2019 1040   HGBUR LARGE (A) 07/27/2019 1040   BILIRUBINUR NEGATIVE 07/27/2019 1040   KETONESUR NEGATIVE 07/27/2019 1040   PROTEINUR NEGATIVE 07/27/2019 1040   UROBILINOGEN 2.0 (H) 07/25/2018 1453   NITRITE NEGATIVE 07/27/2019 1040   LEUKOCYTESUR NEGATIVE 07/27/2019 1040   Sepsis Labs Invalid input(s): PROCALCITONIN,  WBC,  LACTICIDVEN Microbiology Recent Results (from the past 240 hour(s))  Urine culture     Status: Abnormal   Collection Time: 07/27/19 10:40 AM   Specimen: Urine, Clean Catch  Result Value Ref Range Status   Specimen Description   Final    URINE, CLEAN CATCH Performed at [facility], [address] [place], [place] [address]    Special Requests   Final    NONE Performed at [facility], [address], [place], [place] [address]    Culture (A)  Final    <10,000 COLONIES/mL >=100,000 COLONIES/mL Performed at [facility], [address], [place], [place] [address]    Report Status 07/28/2019 FINAL  Final  Respiratory Panel by RT PCR (Flu A&B, Covid) - Nasopharyngeal Swab     Status: None   Collection Time: 07/27/19  3:22 PM   Specimen: Nasopharyngeal Swab  Result Value Ref Range Status   SARS Coronavirus 2 by RT PCR NEGATIVE NEGATIVE Final    Comment: (NOTE) SARS-CoV-2 target nucleic acids are NOT DETECTED. The SARS-CoV-2 RNA is generally detectable in upper respiratoy specimens during the acute phase of infection. The lowest concentration of SARS-CoV-2 viral copies this assay can detect is 131 copies/mL. A negative result does not preclude SARS-Cov-2 infection and should not be used as the sole basis for treatment or other patient management decisions. A negative result may occur with  improper specimen collection/handling, submission of specimen other than nasopharyngeal swab, presence of viral mutation(s) within the areas targeted by this assay, and inadequate number of viral copies (<131 copies/mL). A negative result must be combined with clinical observations, patient history, and epidemiological information. The expected result is Negative. Fact Sheet for Patients:  https://www.moore.com/ Fact Sheet for Healthcare Providers:  https://www.young.biz/ This test is not yet ap proved or cleared by the [place] FDA and  has been authorized for detection and/or diagnosis of SARS-CoV-2 by FDA under an Emergency Use Authorization (EUA). This EUA will remain  in effect (meaning this test can be used) for the duration of the COVID-19 declaration under Section 564(b)(1) of the Act, 21 U.S.C. section 360bbb-3(b)(1), unless the authorization is terminated or revoked sooner.    Influenza A by PCR NEGATIVE  NEGATIVE Final   Influenza B by PCR NEGATIVE NEGATIVE Final    Comment: (NOTE) The Xpert Xpress SARS-CoV-2/FLU/RSV assay is intended as an aid in  the diagnosis of influenza from Nasopharyngeal swab specimens and  should not be used as a sole basis for treatment. Nasal washings and  aspirates are unacceptable for Xpert Xpress SARS-CoV-2/FLU/RSV  testing. Fact Sheet for Patients: https://www.moore.com/ Fact Sheet for Healthcare Providers: https://www.young.biz/ This test is not yet approved or cleared by the [place] FDA and  has been authorized for detection and/or diagnosis of  SARS-CoV-2 by  FDA under an Emergency Use Authorization (EUA). This EUA will remain  in effect (meaning this test can be used) for the duration of the  Covid-19 declaration under Section 564(b)(1) of the Act, 21  U.S.C. section 360bbb-3(b)(1), unless the authorization is  terminated or revoked. Performed at [facility], [address] [place], [place] [address]    Time spent: 30min  SIGNED:   Rickey BarbaraStephen Lailyn Appelbaum, MD  [facility] 07/29/2019, 12:50 PM  If 7PM-7AM, please contact night-coverage

## 2019-08-20 ENCOUNTER — Other Ambulatory Visit: Payer: Self-pay

## 2019-08-20 ENCOUNTER — Emergency Department (HOSPITAL_COMMUNITY)
Admission: EM | Admit: 2019-08-20 | Discharge: 2019-08-20 | Disposition: A | Discharge status: Home / Self Care | Payer: BC Managed Care – PPO | Attending: Emergency Medicine | Admitting: Emergency Medicine

## 2019-08-20 DIAGNOSIS — Z87440 Personal history of urinary (tract) infections: Secondary | ICD-10-CM | POA: Diagnosis not present

## 2019-08-20 DIAGNOSIS — Z8742 Personal history of other diseases of the female genital tract: Secondary | ICD-10-CM

## 2019-08-20 DIAGNOSIS — R112 Nausea with vomiting, unspecified: Secondary | ICD-10-CM | POA: Diagnosis not present

## 2019-08-20 DIAGNOSIS — K219 Gastro-esophageal reflux disease without esophagitis: Secondary | ICD-10-CM | POA: Diagnosis not present

## 2019-08-20 DIAGNOSIS — R1084 Generalized abdominal pain: Secondary | ICD-10-CM | POA: Insufficient documentation

## 2019-08-20 DIAGNOSIS — N809 Endometriosis, unspecified: Secondary | ICD-10-CM | POA: Insufficient documentation

## 2019-08-20 LAB — COMPREHENSIVE METABOLIC PANEL
ALT: 12 U/L (ref 0–44)
AST: 16 U/L (ref 15–41)
Albumin: 3.7 g/dL (ref 3.5–5.0)
Alkaline Phosphatase: 44 U/L (ref 38–126)
Anion gap: 7 (ref 5–15)
BUN: 15 mg/dL (ref 6–20)
CO2: 23 mmol/L (ref 22–32)
Calcium: 8.5 mg/dL — ABNORMAL LOW (ref 8.9–10.3)
Chloride: 107 mmol/L (ref 98–111)
Creatinine, Ser: 0.76 mg/dL (ref 0.44–1.00)
GFR calc Af Amer: >60 mL/min (ref >60)
GFR calc non Af Amer: >60 mL/min (ref >60)
Glucose, Bld: 92 mg/dL (ref 70–99)
Potassium: 4.6 mmol/L (ref 3.5–5.1)
Sodium: 137 mmol/L (ref 135–145)
Total Bilirubin: 0.9 mg/dL (ref 0.3–1.2)
Total Protein: 6.7 g/dL (ref 6.5–8.1)

## 2019-08-20 LAB — URINALYSIS, ROUTINE W REFLEX MICROSCOPIC
Bilirubin Urine: NEGATIVE
Glucose, UA: NEGATIVE mg/dL
Ketones, ur: 20 mg/dL — AB
Leukocytes,Ua: NEGATIVE
Nitrite: NEGATIVE
Protein, ur: NEGATIVE mg/dL
Specific Gravity, Urine: 1.012 (ref 1.005–1.030)
pH: 6 (ref 5.0–8.0)

## 2019-08-20 LAB — CBC WITH DIFFERENTIAL/PLATELET
Abs Immature Granulocytes: 0.03 10*3/uL (ref 0.00–0.07)
Basophils Absolute: 0 10*3/uL (ref 0.0–0.1)
Basophils Relative: 0 %
Eosinophils Absolute: 0 10*3/uL (ref 0.0–0.5)
Eosinophils Relative: 0 %
HCT: 36.9 % (ref 36.0–46.0)
Hemoglobin: 11.4 g/dL — ABNORMAL LOW (ref 12.0–15.0)
Immature Granulocytes: 0 %
Lymphocytes Relative: 12 %
Lymphs Abs: 0.8 10*3/uL (ref 0.7–4.0)
MCH: 29.5 pg (ref 26.0–34.0)
MCHC: 30.9 g/dL (ref 30.0–36.0)
MCV: 95.6 fL (ref 80.0–100.0)
Monocytes Absolute: 0.4 10*3/uL (ref 0.1–1.0)
Monocytes Relative: 5 %
Neutro Abs: 5.8 10*3/uL (ref 1.7–7.7)
Neutrophils Relative %: 83 %
Platelets: 172 10*3/uL (ref 150–400)
RBC: 3.86 MIL/uL — ABNORMAL LOW (ref 3.87–5.11)
RDW: 12.7 % (ref 11.5–15.5)
WBC: 7 10*3/uL (ref 4.0–10.5)
nRBC: 0 % (ref 0.0–0.2)

## 2019-08-20 LAB — I-STAT BETA HCG BLOOD, ED (MC, WL, AP ONLY): I-stat hCG, quantitative: <5 m[IU]/mL (ref <5)

## 2019-08-20 LAB — LIPASE, BLOOD: Lipase: 30 U/L (ref 11–51)

## 2019-08-20 MED ORDER — HYDROMORPHONE HCL 1 MG/ML IJ SOLN
1.0000 mg | Freq: Once | INTRAMUSCULAR | Status: AC
  Administered 2019-08-20: 1 mg via INTRAVENOUS
  Filled 2019-08-20: qty 1

## 2019-08-20 MED ORDER — PROMETHAZINE HCL 25 MG/ML IJ SOLN
12.5000 mg | Freq: Once | INTRAMUSCULAR | Status: AC
  Administered 2019-08-20: 11:00:00 12.5 mg via INTRAVENOUS
  Filled 2019-08-20: qty 1

## 2019-08-20 MED ORDER — SODIUM CHLORIDE 0.9 % IV BOLUS
500.0000 mL | Freq: Once | INTRAVENOUS | Status: AC
  Administered 2019-08-20: 500 mL via INTRAVENOUS

## 2019-08-20 MED ORDER — SODIUM CHLORIDE 0.9 % IV BOLUS
1000.0000 mL | Freq: Once | INTRAVENOUS | Status: AC
  Administered 2019-08-20: 1000 mL via INTRAVENOUS

## 2019-08-20 NOTE — Discharge Instructions (Addendum)
Continue medications as previously prescribed.  Follow-up with your GYN doctor later this week, and return to the ER if symptoms significantly worsen or change.

## 2019-08-20 NOTE — ED Notes (Signed)
Pharmacy at bedside

## 2019-08-20 NOTE — ED Triage Notes (Signed)
Pt arrives [facility] from home with complaints of abd pain, N/V. Pt reports pain began Thursday and N/V began this am. Pt reports this occurs monthly with menstrual cycle. Pt reports hx of endometriosis. Pt reports she required admission in the past for the same. EMS administered 4mg  Zofran, Fentanyl IV for pain

## 2019-08-20 NOTE — ED Provider Notes (Signed)
[facility] DEPT Provider Note   CSN: [id number] Arrival date & time: 08/20/19  1093     History Chief Complaint  Patient presents with  . Abdominal Pain    Jane [name] is a 34 y.o. female.  Patient is a 35 year old female with history of endometriosis with chronic abdominal pain, GERD, prior UTI, and recent admission for a flareup of endometriosis with hypocalcemia.  She presents today for evaluation of abdominal pain.  She describes generalized abdominal pain that began 4 days ago.  She started with nausea and vomiting this morning and is unable to take her home medications due to this.  She denies fevers or chills.  She denies any bowel or bladder complaints.  The history is provided by the patient.  Abdominal Pain Pain location:  Generalized Pain quality: cramping   Pain radiates to:  Does not radiate Pain severity:  Severe Onset quality:  Sudden Duration:  4 days Timing:  Constant Progression:  Worsening Chronicity:  Recurrent Relieved by:  Nothing Worsened by:  Movement and palpation      Past Medical History:  Diagnosis Date  . Bladder infection   . Endometriosis   . GERD (gastroesophageal reflux disease)   . Migraine   . UTI (lower urinary tract infection)     Patient Active Problem List   Diagnosis Date Noted  . Intractable nausea and vomiting 07/27/2019  . Hypocalcemia 07/27/2019  . Dehydration 07/27/2019  . Vitamin D deficiency 09/09/2014  . Dental cavities 10/09/2013  . Abdominal bloating 10/09/2013  . Right shoulder pain 10/09/2013  . 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 Other and unspecified ovarian cyst 01/09/2011  . Cocaine abuse, unspecified 01/09/2011    Past Surgical History:  Procedure Laterality Date  . UMBILICAL HERNIA REPAIR       OB History   No obstetric history on file.     Family History  Problem Relation Age of Onset  . Hypertension Maternal Grandmother   .  Stroke Maternal Grandmother   . Cancer Maternal Grandmother   . Cancer Paternal Grandmother   . Endometriosis Sister     Social History   Tobacco Use  . Smoking status: Never Smoker  . Smokeless tobacco: Never Used  Substance Use Topics  . Alcohol use: No  . Drug use: No    Home Medications Prior to Admission medications   Medication Sig Start Date End Date Taking? Authorizing Provider  acetaminophen (TYLENOL) 500 MG tablet Take 1,000 mg by mouth every 6 (six) hours as needed for moderate pain (Cramps).     [provider]  Cholecalciferol (VITAMIN D) 125 MCG (5000 UT) CAPS Take 1 capsule by mouth daily. 07/29/19   Donne Hazel, MD  docusate sodium (COLACE) 100 MG capsule Take 1 capsule (100 mg total) by mouth 2 (two) times daily. 07/29/19 08/28/19  Donne Hazel, MD  ibuprofen (ADVIL,MOTRIN) 800 MG tablet Take 1 tablet (800 mg total) by mouth 3 (three) times daily. Patient taking differently: Take 800 mg by mouth daily as needed (Cramps).  10/15/18   Joy, Shawn C, PA-C  ondansetron (ZOFRAN-ODT) 4 MG disintegrating tablet Take 1 tablet (4 mg total) by mouth every 8 (eight) hours as needed for nausea or vomiting. 05/18/19   Domenic Moras, PA-C  oxyCODONE-acetaminophen (PERCOCET/ROXICET) 5-325 MG tablet Take 1 tablet by mouth every 6 (six) hours as needed. Patient taking differently: Take 1 tablet by mouth every 6 (six) hours as needed for moderate pain (Cramps).  04/30/19   Constant, Peggy, MD  pantoprazole (PROTONIX) 40 MG tablet Take 1 tablet (40 mg total) by mouth daily. 02/14/19 07/27/19  Tressia Danas, MD  polyethylene glycol (MIRALAX) 17 g packet Take 17 g by mouth daily. 07/29/19   Jerald Kief, MD  prochlorperazine (COMPAZINE) 5 MG tablet Take 1 tablet (5 mg total) by mouth every 6 (six) hours as needed for nausea or vomiting. 07/29/19   Jerald Kief, MD    Allergies    Toradol [ketorolac tromethamine], Clindamycin/lincomycin, and Vicodin  [hydrocodone-acetaminophen]  Review of Systems   Review of Systems  Gastrointestinal: Positive for abdominal pain.  All other systems reviewed and are negative.   Physical Exam Updated Vital Signs BP 112/66 (BP Location: Left Arm)   Pulse 78   Temp 98.3 F (36.8 C) (Oral)   Resp 18   LMP 07/25/2019   SpO2 100%   Physical Exam Vitals and nursing note reviewed.  Constitutional:      General: She is not in acute distress.    Appearance: She is well-developed. She is not diaphoretic.  HENT:     Head: Normocephalic and atraumatic.  Cardiovascular:     Rate and Rhythm: Normal rate and regular rhythm.     Heart sounds: No murmur. No friction rub. No gallop.   Pulmonary:     Effort: Pulmonary effort is normal. No respiratory distress.     Breath sounds: Normal breath sounds. No wheezing.  Abdominal:     General: Bowel sounds are normal. There is no distension.     Palpations: Abdomen is soft.     Tenderness: There is generalized abdominal tenderness. There is no right CVA tenderness, left CVA tenderness, guarding or rebound.  Musculoskeletal:        General: Normal range of motion.     Cervical back: Normal range of motion and neck supple.  Skin:    General: Skin is warm and dry.  Neurological:     Mental Status: She is alert and oriented to person, place, and time.     ED Results / Procedures / Treatments   Labs (all labs ordered are listed, but only abnormal results are displayed) Labs Reviewed  COMPREHENSIVE METABOLIC PANEL  CBC WITH DIFFERENTIAL/PLATELET  LIPASE, BLOOD  URINALYSIS, ROUTINE W REFLEX MICROSCOPIC  I-STAT BETA HCG BLOOD, ED ([facility], [facility], [facility] ONLY)    EKG None  Radiology No results found.  Procedures Procedures (including critical care time)  Medications Ordered in ED Medications  sodium chloride 0.9 % bolus 1,000 mL (has no administration in time range)  HYDROmorphone (DILAUDID) injection 1 mg (has no administration in time range)  promethazine  (PHENERGAN) injection 12.5 mg (has no administration in time range)    ED Course  I have reviewed the triage vital signs and the nursing notes.  Pertinent labs & imaging results that were available during my care of the patient were reviewed by me and considered in my medical decision making (see chart for details).  MDM Rules/Calculators/A&P  Patient presenting here with complaints of abdominal pain.  She has a history of endometriosis and this feels like a worsening of this.  Patient's laboratory studies are reassuring and she is feeling better after receiving fluids and pain medicine.  At this point, I do not feel as though any imaging studies or admission is indicated.  She is to continue her home medications and follow-up with her GYN.  Final Clinical Impression(s) / ED Diagnoses Final diagnoses:  None  Rx / DC Orders ED Discharge Orders    None       Geoffery Lyons, MD 08/20/19 1439

## 2019-09-12 ENCOUNTER — Emergency Department (HOSPITAL_COMMUNITY)
Admission: EM | Admit: 2019-09-12 | Discharge: 2019-09-12 | Disposition: A | Discharge status: Home / Self Care | Payer: BC Managed Care – PPO | Attending: Emergency Medicine | Admitting: Emergency Medicine

## 2019-09-12 DIAGNOSIS — Z79899 Other long term (current) drug therapy: Secondary | ICD-10-CM | POA: Insufficient documentation

## 2019-09-12 DIAGNOSIS — R109 Unspecified abdominal pain: Secondary | ICD-10-CM

## 2019-09-12 DIAGNOSIS — R1030 Lower abdominal pain, unspecified: Secondary | ICD-10-CM | POA: Insufficient documentation

## 2019-09-12 DIAGNOSIS — R1013 Epigastric pain: Secondary | ICD-10-CM | POA: Insufficient documentation

## 2019-09-12 LAB — COMPREHENSIVE METABOLIC PANEL
ALT: 15 U/L (ref 0–44)
AST: 16 U/L (ref 15–41)
Albumin: 3.8 g/dL (ref 3.5–5.0)
Alkaline Phosphatase: 48 U/L (ref 38–126)
Anion gap: 9 (ref 5–15)
BUN: 9 mg/dL (ref 6–20)
CO2: 23 mmol/L (ref 22–32)
Calcium: 8.8 mg/dL — ABNORMAL LOW (ref 8.9–10.3)
Chloride: 105 mmol/L (ref 98–111)
Creatinine, Ser: 0.69 mg/dL (ref 0.44–1.00)
GFR calc Af Amer: >60 mL/min (ref >60)
GFR calc non Af Amer: >60 mL/min (ref >60)
Glucose, Bld: 96 mg/dL (ref 70–99)
Potassium: 3.9 mmol/L (ref 3.5–5.1)
Sodium: 137 mmol/L (ref 135–145)
Total Bilirubin: 1 mg/dL (ref 0.3–1.2)
Total Protein: 6.9 g/dL (ref 6.5–8.1)

## 2019-09-12 LAB — I-STAT BETA HCG BLOOD, ED (MC, WL, AP ONLY): I-stat hCG, quantitative: <5 m[IU]/mL (ref <5)

## 2019-09-12 LAB — URINALYSIS, ROUTINE W REFLEX MICROSCOPIC
Bilirubin Urine: NEGATIVE
Glucose, UA: NEGATIVE mg/dL
Ketones, ur: 5 mg/dL — AB
Leukocytes,Ua: NEGATIVE
Nitrite: NEGATIVE
Protein, ur: NEGATIVE mg/dL
Specific Gravity, Urine: 1.011 (ref 1.005–1.030)
pH: 7 (ref 5.0–8.0)

## 2019-09-12 LAB — LIPASE, BLOOD: Lipase: 55 U/L — ABNORMAL HIGH (ref 11–51)

## 2019-09-12 LAB — CBC
HCT: 37.2 % (ref 36.0–46.0)
Hemoglobin: 11.8 g/dL — ABNORMAL LOW (ref 12.0–15.0)
MCH: 30.6 pg (ref 26.0–34.0)
MCHC: 31.7 g/dL (ref 30.0–36.0)
MCV: 96.4 fL (ref 80.0–100.0)
Platelets: 151 10*3/uL (ref 150–400)
RBC: 3.86 MIL/uL — ABNORMAL LOW (ref 3.87–5.11)
RDW: 12.8 % (ref 11.5–15.5)
WBC: 5.4 10*3/uL (ref 4.0–10.5)
nRBC: 0 % (ref 0.0–0.2)

## 2019-09-12 MED ORDER — HYDROMORPHONE HCL 1 MG/ML IJ SOLN
1.0000 mg | Freq: Once | INTRAMUSCULAR | Status: AC
  Administered 2019-09-12: 1 mg via INTRAVENOUS
  Filled 2019-09-12: qty 1

## 2019-09-12 MED ORDER — SODIUM CHLORIDE 0.9% FLUSH: 3.0000 mL | Freq: Once | INTRAVENOUS | Status: DC

## 2019-09-12 MED ORDER — HALOPERIDOL LACTATE 5 MG/ML IJ SOLN
2.0000 mg | Freq: Once | INTRAMUSCULAR | Status: AC
  Administered 2019-09-12: 2 mg via INTRAVENOUS
  Filled 2019-09-12: qty 1

## 2019-09-12 MED ORDER — LACTATED RINGERS IV BOLUS
1000.0000 mL | Freq: Once | INTRAVENOUS | Status: AC
  Administered 2019-09-12: 1000 mL via INTRAVENOUS

## 2019-09-12 NOTE — ED Triage Notes (Signed)
Transported by [facility] from home-- onset yesterday of lower abdominal pain. Hx of endometriosis, period is currently present. VSS. EMS administered 4 mg of Zofran PTA.

## 2019-09-12 NOTE — ED Provider Notes (Signed)
[facility]-EMERGENCY DEPT Provider Note   CSN: [id number] Arrival date & time: 09/12/19  1001     History Chief Complaint  Patient presents with  . Abdominal Pain    Jane [name] is a 34 y.o. female.  HPI   34 year old female with abdominal pain.  Onset yesterday.  Pain is lower abdomen.  Does not lateralize.  She has a past history of endometriosis.  Feels like current symptoms are similar.  She has been taking her home medication without much improvement.  Past Medical History:  Diagnosis Date  . Bladder infection   . Endometriosis   . GERD (gastroesophageal reflux disease)   . Migraine   . UTI (lower urinary tract infection)     Patient Active Problem List   Diagnosis Date Noted  . Intractable nausea and vomiting 07/27/2019  . Hypocalcemia 07/27/2019  . Dehydration 07/27/2019  . Vitamin D deficiency 09/09/2014  . Dental cavities 10/09/2013  . Abdominal bloating 10/09/2013  . Right shoulder pain 10/09/2013  . 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 Other and unspecified ovarian cyst 01/09/2011  . Cocaine abuse, unspecified 01/09/2011    Past Surgical History:  Procedure Laterality Date  . UMBILICAL HERNIA REPAIR       OB History   No obstetric history on file.     Family History  Problem Relation Age of Onset  . Hypertension Maternal Grandmother   . Stroke Maternal Grandmother   . Cancer Maternal Grandmother   . Cancer Paternal Grandmother   . Endometriosis Sister     Social History   Tobacco Use  . Smoking status: Never Smoker  . Smokeless tobacco: Never Used  Substance Use Topics  . Alcohol use: No  . Drug use: No    Home Medications Prior to Admission medications   Medication Sig Start Date End Date Taking? Authorizing Provider  acetaminophen (TYLENOL) 500 MG tablet Take 1,000 mg by mouth every 6 (six) hours as needed for moderate pain (Cramps).     [provider]  Cholecalciferol  (VITAMIN D) 125 MCG (5000 UT) CAPS Take 1 capsule by mouth daily. Patient not taking: Reported on 08/20/2019 07/29/19   Jerald Kief, MD  ibuprofen (ADVIL,MOTRIN) 800 MG tablet Take 1 tablet (800 mg total) by mouth 3 (three) times daily. Patient taking differently: Take 800 mg by mouth daily as needed (Cramps).  10/15/18   Joy, Shawn C, PA-C  ondansetron (ZOFRAN-ODT) 4 MG disintegrating tablet Take 1 tablet (4 mg total) by mouth every 8 (eight) hours as needed for nausea or vomiting. Patient not taking: Reported on 08/20/2019 05/18/19   Fayrene Helper, PA-C  oxyCODONE-acetaminophen (PERCOCET/ROXICET) 5-325 MG tablet Take 1 tablet by mouth every 6 (six) hours as needed. Patient taking differently: Take 1 tablet by mouth every 6 (six) hours as needed for moderate pain (Cramps).  04/30/19   Constant, Peggy, MD  pantoprazole (PROTONIX) 40 MG tablet Take 1 tablet (40 mg total) by mouth daily. 02/14/19 08/20/19  Tressia Danas, MD  polyethylene glycol (MIRALAX) 17 g packet Take 17 g by mouth daily. Patient taking differently: Take 17 g by mouth daily as needed for mild constipation or moderate constipation.  07/29/19   Jerald Kief, MD  prochlorperazine (COMPAZINE) 5 MG tablet Take 1 tablet (5 mg total) by mouth every 6 (six) hours as needed for nausea or vomiting. 07/29/19   Jerald Kief, MD  pseudoephedrine-acetaminophen (TYLENOL SINUS) 30-500 MG TABS tablet Take 1 tablet by mouth every  4 (four) hours as needed (migraine).    [provider]  Vitamin D, Ergocalciferol, (DRISDOL) 1.25 MG (50000 UNIT) CAPS capsule Take 50,000 Units by mouth every 7 (seven) days. Karlo.Pain    [provider]    Allergies    Toradol [ketorolac tromethamine], Clindamycin/lincomycin, and Vicodin [hydrocodone-acetaminophen]  Review of Systems   Review of Systems All systems reviewed and negative, other than as noted in HPI.  Physical Exam Updated Vital Signs BP (!) 89/48 (BP Location: Right Arm) Comment: 2nd  reading 85/52 (63)  Pulse 73   Temp 98.6 F (37 C) (Oral)   Resp 18   Ht 5\' 6"  (1.676 m)   Wt 52.6 kg   SpO2 100%   BMI 18.72 kg/m   Physical Exam Vitals and nursing note reviewed.  Constitutional:      General: She is not in acute distress.    Appearance: She is well-developed.  HENT:     Head: Normocephalic and atraumatic.  Eyes:     General:        Right eye: No discharge.        Left eye: No discharge.     Conjunctiva/sclera: Conjunctivae normal.  Cardiovascular:     Rate and Rhythm: Normal rate and regular rhythm.     Heart sounds: Normal heart sounds. No murmur. No friction rub. No gallop.   Pulmonary:     Effort: Pulmonary effort is normal. No respiratory distress.     Breath sounds: Normal breath sounds.  Abdominal:     General: There is no distension.     Palpations: Abdomen is soft.     Tenderness: There is abdominal tenderness in the epigastric area. There is no right CVA tenderness, left CVA tenderness, guarding or rebound. Negative signs include Murphy's sign, Rovsing's sign, McBurney's sign and psoas sign.  Musculoskeletal:        General: No tenderness.     Cervical back: Neck supple.  Skin:    General: Skin is warm and dry.  Neurological:     Mental Status: She is alert.  Psychiatric:        Behavior: Behavior normal.        Thought Content: Thought content normal.     ED Results / Procedures / Treatments   Labs (all labs ordered are listed, but only abnormal results are displayed) Labs Reviewed  CBC - Abnormal; Notable for the following components:      Result Value   RBC 3.86 (*)    Hemoglobin 11.8 (*)    All other components within normal limits  LIPASE, BLOOD  COMPREHENSIVE METABOLIC PANEL  URINALYSIS, ROUTINE W REFLEX MICROSCOPIC  I-STAT BETA HCG BLOOD, ED ([facility], [facility], [facility] ONLY)    EKG None  Radiology No results found.  Procedures Procedures (including critical care time)  Medications Ordered in ED Medications  sodium chloride  flush (NS) 0.9 % injection 3 mL (has no administration in time range)    ED Course  I have reviewed the triage vital signs and the nursing notes.  Pertinent labs & imaging results that were available during my care of the patient were reviewed by me and considered in my medical decision making (see chart for details).    MDM Rules/Calculators/A&P                      Likely acute on chronic pain possibly from endometriosis. Feeling better. HD stable. Nontoxic. I doubt acute surgical process.  Final Clinical Impression(s) /  ED Diagnoses Final diagnoses:  Abdominal pain, unspecified abdominal location    Rx / DC Orders ED Discharge Orders    None       Raeford Razor, MD 09/16/19 1115

## 2019-10-09 ENCOUNTER — Encounter (HOSPITAL_COMMUNITY): Payer: Self-pay

## 2019-10-09 ENCOUNTER — Emergency Department (HOSPITAL_COMMUNITY)
Admission: EM | Admit: 2019-10-09 | Discharge: 2019-10-09 | Discharge status: Against Medical Advice | Payer: BC Managed Care – PPO | Attending: Emergency Medicine | Admitting: Emergency Medicine

## 2019-10-09 ENCOUNTER — Other Ambulatory Visit: Payer: Self-pay

## 2019-10-09 DIAGNOSIS — R109 Unspecified abdominal pain: Secondary | ICD-10-CM | POA: Diagnosis present

## 2019-10-09 DIAGNOSIS — Z5321 Procedure and treatment not carried out due to patient leaving prior to being seen by health care provider: Secondary | ICD-10-CM | POA: Insufficient documentation

## 2019-10-09 MED ORDER — SODIUM CHLORIDE 0.9% FLUSH: 3.0000 mL | Freq: Once | INTRAVENOUS | Status: DC

## 2019-10-09 NOTE — ED Notes (Signed)
Patient requesting one stick with IV and blood draw.

## 2019-10-09 NOTE — ED Triage Notes (Signed)
Per EMS-Patient reports a history of endometriosis. Patient states she started her period is now having abdominal cramping and nausea. Patient told EMS she comes to the ED monthly when she starts her period.

## 2019-11-25 ENCOUNTER — Emergency Department (HOSPITAL_COMMUNITY)
Admission: EM | Admit: 2019-11-25 | Discharge: 2019-11-25 | Disposition: A | Discharge status: Home / Self Care | Payer: BC Managed Care – PPO | Attending: Emergency Medicine | Admitting: Emergency Medicine

## 2019-11-25 ENCOUNTER — Other Ambulatory Visit: Payer: Self-pay

## 2019-11-25 DIAGNOSIS — Z5321 Procedure and treatment not carried out due to patient leaving prior to being seen by health care provider: Secondary | ICD-10-CM | POA: Insufficient documentation

## 2019-11-25 DIAGNOSIS — R109 Unspecified abdominal pain: Secondary | ICD-10-CM | POA: Diagnosis present

## 2019-11-25 NOTE — ED Triage Notes (Signed)
Patient c/o abdominal pain x 2 days. Patient started her period today. Patient reports a history of endometriosis. Patient took 1/2 tab Percocet prior to EMS arrival. Unknown mg.

## 2019-11-25 NOTE — ED Notes (Signed)
Pt was not present in lobby upon assessment. Name called X3.

## 2020-06-16 ENCOUNTER — Other Ambulatory Visit: Payer: Self-pay

## 2020-06-16 ENCOUNTER — Encounter (HOSPITAL_COMMUNITY): Payer: Self-pay

## 2020-06-16 DIAGNOSIS — R110 Nausea: Secondary | ICD-10-CM | POA: Insufficient documentation

## 2020-06-16 DIAGNOSIS — R1084 Generalized abdominal pain: Secondary | ICD-10-CM | POA: Insufficient documentation

## 2020-06-16 DIAGNOSIS — N809 Endometriosis, unspecified: Secondary | ICD-10-CM | POA: Insufficient documentation

## 2020-06-16 DIAGNOSIS — R109 Unspecified abdominal pain: Secondary | ICD-10-CM | POA: Diagnosis present

## 2020-06-16 LAB — CBC
HCT: 36.2 % (ref 36.0–46.0)
Hemoglobin: 11.3 g/dL — ABNORMAL LOW (ref 12.0–15.0)
MCH: 29.1 pg (ref 26.0–34.0)
MCHC: 31.2 g/dL (ref 30.0–36.0)
MCV: 93.3 fL (ref 80.0–100.0)
Platelets: 173 10*3/uL (ref 150–400)
RBC: 3.88 MIL/uL (ref 3.87–5.11)
RDW: 13.2 % (ref 11.5–15.5)
WBC: 6.9 10*3/uL (ref 4.0–10.5)
nRBC: 0 % (ref 0.0–0.2)

## 2020-06-16 LAB — LIPASE, BLOOD: Lipase: 40 U/L (ref 11–51)

## 2020-06-16 LAB — I-STAT BETA HCG BLOOD, ED (MC, WL, AP ONLY): I-stat hCG, quantitative: <5 m[IU]/mL (ref <5)

## 2020-06-16 LAB — COMPREHENSIVE METABOLIC PANEL
ALT: 13 U/L (ref 0–44)
AST: 15 U/L (ref 15–41)
Albumin: 3.7 g/dL (ref 3.5–5.0)
Alkaline Phosphatase: 40 U/L (ref 38–126)
Anion gap: 7 (ref 5–15)
BUN: 11 mg/dL (ref 6–20)
CO2: 25 mmol/L (ref 22–32)
Calcium: 9 mg/dL (ref 8.9–10.3)
Chloride: 106 mmol/L (ref 98–111)
Creatinine, Ser: 0.81 mg/dL (ref 0.44–1.00)
GFR, Estimated: >60 mL/min (ref >60)
Glucose, Bld: 112 mg/dL — ABNORMAL HIGH (ref 70–99)
Potassium: 3.9 mmol/L (ref 3.5–5.1)
Sodium: 138 mmol/L (ref 135–145)
Total Bilirubin: 0.5 mg/dL (ref 0.3–1.2)
Total Protein: 6.7 g/dL (ref 6.5–8.1)

## 2020-06-16 LAB — URINALYSIS, ROUTINE W REFLEX MICROSCOPIC
Bilirubin Urine: NEGATIVE
Glucose, UA: NEGATIVE mg/dL
Hgb urine dipstick: NEGATIVE
Ketones, ur: NEGATIVE mg/dL
Leukocytes,Ua: NEGATIVE
Nitrite: NEGATIVE
Protein, ur: NEGATIVE mg/dL
Specific Gravity, Urine: 1.02 (ref 1.005–1.030)
pH: 7 (ref 5.0–8.0)

## 2020-06-16 NOTE — ED Triage Notes (Signed)
Patient arrived via [facility] with complaints of generalized abdominal pain that started today. Reports history of endometriosis. Complaints of nausea.

## 2020-06-17 ENCOUNTER — Emergency Department (HOSPITAL_COMMUNITY)
Admission: EM | Admit: 2020-06-17 | Discharge: 2020-06-17 | Disposition: A | Discharge status: Home / Self Care | Payer: BC Managed Care – PPO | Attending: Emergency Medicine | Admitting: Emergency Medicine

## 2020-06-17 DIAGNOSIS — Z8742 Personal history of other diseases of the female genital tract: Secondary | ICD-10-CM

## 2020-06-17 DIAGNOSIS — R1084 Generalized abdominal pain: Secondary | ICD-10-CM

## 2020-06-17 MED ORDER — OXYCODONE-ACETAMINOPHEN 5-325 MG PO TABS
1.0000 | ORAL_TABLET | Freq: Once | ORAL | Status: AC
  Administered 2020-06-17: 1 via ORAL
  Filled 2020-06-17: qty 1

## 2020-06-17 MED ORDER — ONDANSETRON 4 MG PO TBDP
4.0000 mg | ORAL_TABLET | Freq: Once | ORAL | Status: AC
  Administered 2020-06-17: 4 mg via ORAL
  Filled 2020-06-17: qty 1

## 2020-06-17 MED ORDER — NAPROXEN 500 MG PO TABS
500.0000 mg | ORAL_TABLET | Freq: Once | ORAL | Status: AC
  Administered 2020-06-17: 500 mg via ORAL
  Filled 2020-06-17: qty 1

## 2020-06-17 NOTE — ED Provider Notes (Signed)
[facility]-EMERGENCY DEPT Provider Note   CSN: [id number] Arrival date & time: 06/16/20  2129     History Chief Complaint  Patient presents with  . Abdominal Pain    Jane [name] is a 34 y.o. female.  HPI     This is a 34 year old female with a history of endometriosis and reflux who presents with abdominal pain.  Patient reports that she is due to start her cycle today or tomorrow.  She states normally at the beginning of her cycle she has crampy abdominal pain.  However, pain is worsened today.  Started higher up and migrated into her lower quadrants.  She took Tylenol at home around 6 PM with minimal relief.  She does have Percocet to take for breakthrough pain given her history of endometriosis; however, did not feel comfortable taking Percocet in addition to Tylenol.  She has previously also taken ibuprofen.  She reports nausea without vomiting.  No urinary symptoms.  No lateralizing pain.  Currently she rates her pain at 8 out of 10.  Denies fever or other systemic symptoms.  Past Medical History:  Diagnosis Date  . Bladder infection   . Endometriosis   . GERD (gastroesophageal reflux disease)   . Migraine   . UTI (lower urinary tract infection)     Patient Active Problem List   Diagnosis Date Noted  . Intractable nausea and vomiting 07/27/2019  . Hypocalcemia 07/27/2019  . Dehydration 07/27/2019  . Vitamin D deficiency 09/09/2014  . Dental cavities 10/09/2013  . Abdominal bloating 10/09/2013  . Right shoulder pain 10/09/2013  . 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 Other and unspecified ovarian cyst 01/09/2011  . Cocaine abuse, unspecified 01/09/2011    Past Surgical History:  Procedure Laterality Date  . UMBILICAL HERNIA REPAIR       OB History   No obstetric history on file.     Family History  Problem Relation Age of Onset  . Hypertension Maternal Grandmother   . Stroke Maternal Grandmother   .  Cancer Maternal Grandmother   . Cancer Paternal Grandmother   . Endometriosis Sister     Social History   Tobacco Use  . Smoking status: Never Smoker  . Smokeless tobacco: Never Used  Vaping Use  . Vaping Use: Never used  Substance Use Topics  . Alcohol use: No  . Drug use: No    Home Medications Prior to Admission medications   Medication Sig Start Date End Date Taking? Authorizing Provider  acetaminophen (TYLENOL) 500 MG tablet Take 1,000 mg by mouth every 6 (six) hours as needed for moderate pain (Cramps).     [provider]  Calcium-Magnesium-Vitamin D (CALCIUM MAGNESIUM PO) Take 1 tablet by mouth daily.    [provider]  Cholecalciferol (VITAMIN D) 125 MCG (5000 UT) CAPS Take 1 capsule by mouth daily. Patient not taking: Reported on 08/20/2019 07/29/19   Jerald Kief, MD  ibuprofen (ADVIL,MOTRIN) 800 MG tablet Take 1 tablet (800 mg total) by mouth 3 (three) times daily. Patient taking differently: Take 800 mg by mouth daily as needed (Cramps).  10/15/18   Joy, Shawn C, PA-C  ondansetron (ZOFRAN-ODT) 4 MG disintegrating tablet Take 1 tablet (4 mg total) by mouth every 8 (eight) hours as needed for nausea or vomiting. Patient not taking: Reported on 08/20/2019 05/18/19   Fayrene Helper, PA-C  oxyCODONE-acetaminophen (PERCOCET/ROXICET) 5-325 MG tablet Take 1 tablet by mouth every 6 (six) hours as needed. Patient taking differently: Take  1 tablet by mouth every 6 (six) hours as needed for moderate pain (Cramps).  04/30/19   Constant, Peggy, MD  pantoprazole (PROTONIX) 40 MG tablet Take 1 tablet (40 mg total) by mouth daily. 02/14/19 08/20/19  Tressia Danas, MD  polyethylene glycol (MIRALAX) 17 g packet Take 17 g by mouth daily. Patient taking differently: Take 17 g by mouth daily as needed for mild constipation or moderate constipation.  07/29/19   Jerald Kief, MD  prochlorperazine (COMPAZINE) 5 MG tablet Take 1 tablet (5 mg total) by mouth every 6 (six) hours as  needed for nausea or vomiting. 07/29/19   Jerald Kief, MD  pseudoephedrine-acetaminophen (TYLENOL SINUS) 30-500 MG TABS tablet Take 1 tablet by mouth every 4 (four) hours as needed (migraine).    [provider]  Vitamin D, Ergocalciferol, (DRISDOL) 1.25 MG (50000 UNIT) CAPS capsule Take 50,000 Units by mouth every 7 (seven) days. Marcelle.Calk    [provider]    Allergies    Toradol [ketorolac tromethamine], Clindamycin/lincomycin, and Vicodin [hydrocodone-acetaminophen]  Review of Systems   Review of Systems  Constitutional: Negative for fever.  Respiratory: Negative for shortness of breath.   Cardiovascular: Negative for chest pain.  Gastrointestinal: Positive for abdominal pain and nausea. Negative for constipation, diarrhea and vomiting.  Genitourinary: Negative for dysuria.  All other systems reviewed and are negative.   Physical Exam Updated Vital Signs BP 119/84 (BP Location: Left Arm)   Pulse 80   Temp 98.6 F (37 C) (Oral)   Resp 12   LMP 06/16/2020   SpO2 100%   Physical Exam Vitals and nursing note reviewed.  Constitutional:      Appearance: She is well-developed.  HENT:     Head: Normocephalic and atraumatic.     Mouth/Throat:     Mouth: Mucous membranes are moist.  Eyes:     Pupils: Pupils are equal, round, and reactive to light.  Cardiovascular:     Rate and Rhythm: Normal rate and regular rhythm.     Heart sounds: Normal heart sounds.  Pulmonary:     Effort: Pulmonary effort is normal. No respiratory distress.     Breath sounds: No wheezing.  Abdominal:     General: Bowel sounds are normal.     Palpations: Abdomen is soft.     Tenderness: There is generalized abdominal tenderness. There is no guarding or rebound.  Musculoskeletal:     Cervical back: Neck supple.  Skin:    General: Skin is warm and dry.  Neurological:     Mental Status: She is alert and oriented to person, place, and time.  Psychiatric:        Mood and Affect:  Mood normal.     ED Results / Procedures / Treatments   Labs (all labs ordered are listed, but only abnormal results are displayed) Labs Reviewed  COMPREHENSIVE METABOLIC PANEL - Abnormal; Notable for the following components:      Result Value   Glucose, Bld 112 (*)    All other components within normal limits  CBC - Abnormal; Notable for the following components:   Hemoglobin 11.3 (*)    All other components within normal limits  URINALYSIS, ROUTINE W REFLEX MICROSCOPIC - Abnormal; Notable for the following components:   APPearance HAZY (*)    All other components within normal limits  LIPASE, BLOOD  I-STAT BETA HCG BLOOD, ED ([facility], [facility], [facility] ONLY)    EKG None  Radiology No results found.  Procedures Procedures (including  critical care time)  Medications Ordered in ED Medications  naproxen (NAPROSYN) tablet 500 mg (500 mg Oral Given 06/17/20 0115)  oxyCODONE-acetaminophen (PERCOCET/ROXICET) 5-325 MG per tablet 1 tablet (1 tablet Oral Given 06/17/20 0115)  ondansetron (ZOFRAN-ODT) disintegrating tablet 4 mg (4 mg Oral Given 06/17/20 0141)    ED Course  I have reviewed the triage vital signs and the nursing notes.  Pertinent labs & imaging results that were available during my care of the patient were reviewed by me and considered in my medical decision making (see chart for details).    MDM Rules/Calculators/A&P                          Patient presents with abdominal pain.  She is overall nontoxic and vital signs are reassuring.  Reports that she classically gets pain from her endometriosis around this time in her cycle although today pain was more intense.  She has some generalized tenderness on exam but no signs of peritonitis.  No lateralizing symptoms suggestive of appendicitis, gallbladder pathology, ovarian pathology.  Lab work reviewed from triage.  She is not pregnant.  Lipase normal.  No significant metabolic derangement.  No leukocytosis.  We will plan for pain  control as patient symptoms are classic for her endometriosis pain.  Will defer imaging at this time given low suspicion for intraabdominal emergent pathology.  Patient was given Percocet and naproxen.  On recheck, she states she is feeling somewhat better.  Counseled the patient that she may need to take Percocet for breakthrough pain as previously prescribed.  She needs to monitor her total Tylenol dosage per day to less than 3 to 4 g.  I also encourage scheduled anti-inflammatories.  Patient stated understanding  After history, exam, and medical workup I feel the patient has been appropriately medically screened and is safe for discharge home. Pertinent diagnoses were discussed with the patient. Patient was given return precautions.  Final Clinical Impression(s) / ED Diagnoses Final diagnoses:  Generalized abdominal pain  History of endometriosis    Rx / DC Orders ED Discharge Orders    None       Amazin Pincock, Mayer Masker, MD 06/17/20 [phone]

## 2020-06-17 NOTE — Discharge Instructions (Addendum)
You were seen today for abdominal pain.  Your lab work is reassuring.  Make sure that you are taking ibuprofen every 6 hours.  You may take Percocet for breakthrough pain as previously prescribed.

## 2020-06-30 ENCOUNTER — Other Ambulatory Visit: Payer: Self-pay | Admitting: Gastroenterology

## 2020-06-30 DIAGNOSIS — R933 Abnormal findings on diagnostic imaging of other parts of digestive tract: Secondary | ICD-10-CM

## 2020-11-18 ENCOUNTER — Ambulatory Visit: Payer: BC Managed Care – PPO | Admitting: Gastroenterology

## 2020-11-18 ENCOUNTER — Encounter: Payer: Self-pay | Admitting: Gastroenterology

## 2020-11-18 VITALS — BP 100/64 | HR 108 | Ht 66.75 in | Wt 126.0 lb

## 2020-11-18 DIAGNOSIS — K219 Gastro-esophageal reflux disease without esophagitis: Secondary | ICD-10-CM | POA: Diagnosis not present

## 2020-11-18 DIAGNOSIS — R1031 Right lower quadrant pain: Secondary | ICD-10-CM | POA: Diagnosis not present

## 2020-11-18 MED ORDER — FAMOTIDINE 20 MG PO TABS: 20.0000 mg | ORAL_TABLET | Freq: Two times a day (BID) | ORAL | 2 refills | Status: DC

## 2020-11-18 MED ORDER — FAMOTIDINE 20 MG PO TABS: 20.0000 mg | ORAL_TABLET | Freq: Two times a day (BID) | ORAL | 2 refills | Status: AC

## 2020-11-18 MED ORDER — OMEPRAZOLE 40 MG PO CPDR: 1.0000 | DELAYED_RELEASE_CAPSULE | Freq: Every day | ORAL | 2 refills | Status: DC

## 2020-11-18 NOTE — Progress Notes (Signed)
Referring Provider: Leilani Able, MD Primary Care Physician:  Leilani Able, MD  Chief complaint: Abdominal pain   IMPRESSION:  Epigastric abdominal pain in the setting of recent NSAIDs Right lower quadrant abdominal pain with abnormal CT of the colon 2020 New anemia without overt blood loss    - labs to follow-up on anemia History of reflux with dysphonia  Patient concern about endometriosis   Epigastric abdominal pain: May be NSAID-related PUD, gastritis, H pylori, esophagitis less likely malignancy.  Continue PPI BID and Carafate. Increase famotidine 20 mg BID. Proceed with EGD for further evaluation.   3.2 x 1.9 cm soft tissue density need on recent CT scan 2020. Differential diagnosis includes endometriosis, chronic appendicitis, appendiceal neoplasm not seen on colonoscopy, and cecal carcinoma. Plan repeat CT scan to clarify change since 2020. Patient reports surgical evaluation in 2020, however, [facility] has no records of an office visit.   History of reflux with LPR causing hoarseness and dysphonia. She is resistant to any medications. Recommend a trial of PPI prior to proceeding with endoscopy.   New anemia: No overt or occult GI blood loss. Hemoglobin 13.2 down to 11.3 over the course of 3 years.   PLAN: - Continue Carafate, omeprazole, and increase famotidine 20 mg BID - Ferritin, iron, hemoglobin - CT abd/pelvis with contrast - EGD with biopsies (may place on cancellation list)   Please see the "Patient Instructions" section for addition details about the plan.  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 Pecola Leisure for further evaluation of an abnormal CT scan. My one prior visit with her occurred 02/14/2019.  The interval history is obtained through the patient and review of her electronic health record. She has a history of gastroesophageal reflux disease since 2004, dysphonia since 2008, and endometriosis  diagnosed in 2005 requiring umbilical hernia repair.   During the evaluation of predominantly lower abdominal and pelvis pain with associated bloating and unintentional weight loss in 2020, a CT scan 01/23/19 showed an ill-defined soft tissue density in the right lower quadrant involving the tip of the cecum measuring 3.2 x 1.9 cm. It was new compared to CT from 10/03/16. Prior to our consultation in 2020, Jane [name] felt that her symptoms were due to endometriosis and requested a lapartomy. She was seen by GYN who did not feel endometriosis was the source. They offered low-dose hormone therapy but the patient declined.   Dr. Madilyn Hook recommended ibuprofen 800 mg 4 times daily for 30 days, omega-3, probiotic, and Reglan.  But treatment provided no relief.  Colonoscopy 02/21/2019 showed no cecal abnormality but did show some mild ileitis, although the biopsies were normal. This was attributed to NSAIDs. Cecal biopsies at the appendiceal orifice were also normal. I referred her to Dr. Maisie Fus at [facility]. Christena understood that the surgery would require removal of the surgeons and multiple surgeons.  She followed up with a fertility specialist since that time. He thought the CT findings it might be endometriosis.   Recent complaints of upper abdominal pain and vocal pain. Seen at an Urgent Care in [place] while traveling for work. Prescribed Carafate in addition to her omeprazole. Told she needed to have an EGD and made Vonzella very concerned about risks of undiagnosed diagnoses.  Follow-up with ENT when she returned home resulting in her starting famotidine QHS.   As soon as she returned home, she called to make an appointment with me.  When she couldn't get  percocet for her endometriosis pain, she continued to use the ibuprofen for menstral pains. Last used ibuprofen last month.   There has been no follow-up abdominal imaging except for normal abdominal x-ray 07/28/19.   The most recent labs  available to me are from 06/16/20 and show a hemoglobin of 11.3 and glucose of 112, otherwise normal CMP and CBC.   Labs from 06/29/2018 show a normal CBC including a hemoglobin of 13.2, MCV 92.4, RDW 12.2, platelets 195.  Mother had an ulcer. Sister with endometriosis. No known family history of colon cancer or polyps. No family history of uterine/endometrial cancer, pancreatic cancer or gastric/stomach cancer.  The patient has been begging for a "laparotomy" because she is concerned this is due to endometriosis. "I'm ready to cut myself open."  Taking Ibuprofen 800mg  for menstrual cramps that are "debilitating." No other NSAIDs.   Pelvic ultrasound 06/02/18: 2.1 cm hypoechoic flat lesion within the left ovary thought to be a complex cyst, possibly a callapsing hemorrhagic cyst. Exam otherwise normal.   Past Medical History:  Diagnosis Date  . Bladder infection   . Endometriosis   . GERD (gastroesophageal reflux disease)   . Migraine   . UTI (lower urinary tract infection)     Past Surgical History:  Procedure Laterality Date  . UMBILICAL HERNIA REPAIR      Current Outpatient Medications  Medication Sig Dispense Refill  . acetaminophen (TYLENOL) 500 MG tablet Take 1,000 mg by mouth every 6 (six) hours as needed for moderate pain (Cramps).     . Calcium-Magnesium-Vitamin D (CALCIUM MAGNESIUM PO) Take 1 tablet by mouth daily.    . Cholecalciferol (VITAMIN D) 125 MCG (5000 UT) CAPS Take 1 capsule by mouth daily. 30 capsule 0  . famotidine (PEPCID) 20 MG tablet Take 20 mg by mouth at bedtime.    . medroxyPROGESTERone Acetate 150 MG/ML SUSY Inject 1 mL into the muscle every 3 (three) months.    06/04/18 omeprazole (PRILOSEC) 40 MG capsule Take 1 capsule by mouth daily.    . ondansetron (ZOFRAN-ODT) 4 MG disintegrating tablet Take 1 tablet (4 mg total) by mouth every 8 (eight) hours as needed for nausea or vomiting. 15 tablet 0  . oxyCODONE-acetaminophen (PERCOCET/ROXICET) 5-325 MG tablet Take  1 tablet by mouth every 6 (six) hours as needed. (Patient taking differently: Take 1 tablet by mouth every 6 (six) hours as needed for moderate pain (Cramps).) 20 tablet 0  . pseudoephedrine-acetaminophen (TYLENOL SINUS) 30-500 MG TABS tablet Take 1 tablet by mouth every 4 (four) hours as needed (migraine).    . sucralfate (CARAFATE) 1 g tablet Take 1 g by mouth 2 (two) times daily.     Current Facility-Administered Medications  Medication Dose Route Frequency Provider Last Rate Last Admin  . 0.9 %  sodium chloride infusion  500 mL Intravenous Once Marland Kitchen, MD        Allergies as of 11/18/2020 - Review Complete 11/18/2020  Allergen Reaction Noted  . Toradol [ketorolac tromethamine] Anxiety 05/10/2012  . Clindamycin/lincomycin Hives 10/03/2016  . Vicodin [hydrocodone-acetaminophen] Nausea And Vomiting 05/10/2012    Family History  Problem Relation Age of Onset  . Hypertension Maternal Grandmother   . Stroke Maternal Grandmother   . Cancer Maternal Grandmother   . Cancer Paternal Grandmother   . Endometriosis Sister      Physical Exam: General:   Alert,  well-nourished, pleasant and cooperative in NAD.  Eyes are open. Head:  Normocephalic and atraumatic. Eyes:  Sclera clear, no icterus.  Conjunctiva pink. Abdomen:  Soft, thin, nontender, nondistended, normal bowel sounds, no rebound or guarding. No hepatosplenomegaly.  No obvious ascites.  No abdominal wall hernias.  No succession splash. Rectal:  Deferred  Msk:  Symmetrical. No boney deformities LAD: No inguinal or umbilical LAD Extremities:  No clubbing or edema. Neurologic:  Alert and  oriented x4;  grossly nonfocal Skin:  Intact without significant lesions or rashes. Psych:  Alert and cooperative. Normal mood and affect.   Jane Leonardo L. Orvan Falconer, MD, MPH [facility] 11/18/2020, 2:41 PM

## 2020-11-18 NOTE — Progress Notes (Signed)
Following message sent:  Deon Pilling, LPN  Neomia Dear, April H RADIOLOGY [facility] REQUEST   [facility]  Phone: [phone]  Fax: [phone]   Imaging Ordered: CT A/P   Diagnosis: RLQ abd pain   Ordering Provider: Dr. Orvan Falconer   Is a Prior Authorization needed? We are in the process of obtaining it now   Is the patient Diabetic? No   Does the patient have Hypertension? No   Does the patient have any implanted devices or hardware? No   Date of last BUN/Creat, if needed? N/A   Patient Weight? 126#   Is the patient able to get on the table? Yes   Has the patient been diagnosed with COVID? No   Is the patient waiting on COVID testing results? No   Thank you for your assistance!  [facility]

## 2020-11-18 NOTE — Patient Instructions (Addendum)
It was a pleasure to see you today. Based on our discussion, I am providing you with my recommendations below:  RECOMMENDATION(S):   To better evaluate your symptoms, I am recommending an endoscopy  Please continue your Carafate and Omeprazole. I am recommending to increase your Famotidine to 20mg  2 times daily  PRESCRIPTION MEDICATION(S):   We have sent the following medication(s) to your pharmacy:  . Famotidine . At your request, we also sent in a new prescription for Omeprazole  NOTE: If your medication(s) requires a PRIOR AUTHORIZATION, we will receive notification from your pharmacy. Once received, the process to submit for approval may take up to 7-10 business days. You will be contacted about any denials we have received from your insurance company as well as alternatives recommended by your provider.  I am also recommending a CT scan of your abdomen.  IMAGING:  . You will be contacted by [facility] Scheduling (Your caller ID will indicate phone # [phone]) within the next business 7-10 business days to schedule your CT. If you have not heard from them within 7-10 business days, please call [facility] Scheduling at [phone] to follow up on the status of your appointment.    ENDOSCOPY:   . You have been scheduled for an endoscopy. Please follow written instructions given to you at your visit today.  INHALERS:   . If you use inhalers (even only as needed), please bring them with you on the day of your procedure.  FOLLOW UP:  . After your procedure, you will receive a call from my office staff regarding my recommendation for follow up.  BMI:  . If you are age 44 or younger, your body mass index should be between 19-25. Your Body mass index is 19.88 kg/m. If this is out of the aformentioned range listed, please consider follow up with your Primary Care Provider.   Thank you for trusting me with your gastrointestinal care!    08-14-1971,  MD, MPH

## 2020-11-27 ENCOUNTER — Ambulatory Visit (HOSPITAL_COMMUNITY)
Admission: RE | Admit: 2020-11-27 | Discharge: 2020-11-27 | Disposition: A | Discharge status: Home / Self Care | Payer: BC Managed Care – PPO | Source: Ambulatory Visit | Attending: Gastroenterology | Admitting: Gastroenterology

## 2020-11-27 ENCOUNTER — Other Ambulatory Visit: Payer: Self-pay

## 2020-11-27 DIAGNOSIS — R1031 Right lower quadrant pain: Secondary | ICD-10-CM | POA: Diagnosis not present

## 2020-12-04 ENCOUNTER — Telehealth: Payer: Self-pay

## 2020-12-04 NOTE — Telephone Encounter (Signed)
-----   Message from Tressia Danas, MD sent at 12/04/2020  9:42 AM EDT ----- Patient is on call list for cancellations. Please see if she'd like to have her EGD tomorrow instead of in July. I have some cancellations tomorrow.    Thanks.    KLB

## 2020-12-04 NOTE — Telephone Encounter (Signed)
Called and left message on patient's voice mail to please call back today, if possible. Attempting to change her EGD to tomorrow if she is able

## 2020-12-09 NOTE — Telephone Encounter (Signed)
Called patient back and let her know the reason I had called on 12/04/20 was to see if she could do her EGD the next day. We will keep her on the scheduled 01/09/21 date and keep her on the cancellation list

## 2020-12-15 ENCOUNTER — Encounter: Payer: Self-pay | Admitting: Gastroenterology

## 2020-12-15 ENCOUNTER — Telehealth: Payer: Self-pay

## 2020-12-15 NOTE — Telephone Encounter (Signed)
Dr Orvan Falconer,  This pt has called and scheduled a colon on 6-24 per the radiologist report from her CT scan.  Your addendum states -   I reviewed your recent CT scan results. I recommend that you see one of the general surgeons to consider resection of this area. Given the results of your colonoscopy in August 2020, I think another colonoscopy is unlikely to give [place] additional information that would change this recommendation. I am going to ask my staff to work on the referral for you next week when the office is open. KLB  Does pt need a colon 6-24 as scheduled?  I spoke to pt this pm and she states she did schedule the colon as per the radiologist recommendation.   She has an EGD 01-09-2021 scheduled as well  Thanks

## 2020-12-18 ENCOUNTER — Encounter: Payer: Self-pay | Admitting: *Deleted

## 2020-12-18 ENCOUNTER — Ambulatory Visit (AMBULATORY_SURGERY_CENTER): Payer: BC Managed Care – PPO | Admitting: *Deleted

## 2020-12-18 ENCOUNTER — Other Ambulatory Visit: Payer: Self-pay

## 2020-12-18 VITALS — Ht 66.75 in | Wt 127.0 lb

## 2020-12-18 DIAGNOSIS — R1031 Right lower quadrant pain: Secondary | ICD-10-CM

## 2020-12-18 DIAGNOSIS — D649 Anemia, unspecified: Secondary | ICD-10-CM | POA: Insufficient documentation

## 2020-12-18 DIAGNOSIS — R1013 Epigastric pain: Secondary | ICD-10-CM

## 2020-12-18 MED ORDER — SUPREP BOWEL PREP KIT 17.5-3.13-1.6 GM/177ML PO SOLN: 1.0000 | Freq: Once | ORAL | 0 refills | Status: AC

## 2020-12-18 NOTE — Progress Notes (Signed)
Pt's previsit is done over the phone and all paperwork (prep instructions, blank consent form to just read over) sent to patient.  Pt's name and DOB verified at the beginning of the previsit.  Pt denies any difficulty with ambulating.    No trouble with anesthesia, denies being told they were difficult to intubate, or hx/fam hx of malignant hyperthermia per pt   No egg or soy allergy  No home oxygen use   No medications for weight loss taken  emmi information given  Pt denies constipation issues  Pt informed that we do not do prior authorizations for prep  Pt is aware that we awaiting the ok to proceed with colonoscopy from Dr. Orvan Falconer.  She is aware EGD and Colonoscopy are scheduled for different days.  Instructions for both colonoscopy for 01-02-21 and endoscopy for 01-09-21 mailed to pt.  Ambulatory GI referrals put in for both

## 2020-12-24 NOTE — Telephone Encounter (Signed)
Contacted pt with the choice of proceeding with colonoscopy for reassurance or following Dr. Orvan Falconer opinion that it was not needed at this time based on her study.  Pt stated she was ok with Dr. Orvan Falconer recommendation.  Colonoscopy was canceled.     EGD left as scheduled.

## 2021-01-02 ENCOUNTER — Encounter: Payer: BC Managed Care – PPO | Admitting: Gastroenterology

## 2021-01-09 ENCOUNTER — Encounter: Payer: Self-pay | Admitting: Gastroenterology

## 2021-01-09 ENCOUNTER — Other Ambulatory Visit: Payer: Self-pay

## 2021-01-09 ENCOUNTER — Ambulatory Visit (AMBULATORY_SURGERY_CENTER): Payer: BC Managed Care – PPO | Admitting: Gastroenterology

## 2021-01-09 VITALS — BP 122/74 | HR 73 | Temp 97.8°F | Resp 17 | Ht 66.75 in | Wt 127.0 lb

## 2021-01-09 DIAGNOSIS — K3189 Other diseases of stomach and duodenum: Secondary | ICD-10-CM | POA: Diagnosis not present

## 2021-01-09 DIAGNOSIS — R1013 Epigastric pain: Secondary | ICD-10-CM | POA: Diagnosis not present

## 2021-01-09 DIAGNOSIS — R1031 Right lower quadrant pain: Secondary | ICD-10-CM

## 2021-01-09 DIAGNOSIS — K2970 Gastritis, unspecified, without bleeding: Secondary | ICD-10-CM

## 2021-01-09 DIAGNOSIS — K2950 Unspecified chronic gastritis without bleeding: Secondary | ICD-10-CM | POA: Diagnosis not present

## 2021-01-09 MED ORDER — SODIUM CHLORIDE 0.9 % IV SOLN: 500.0000 mL | Freq: Once | INTRAVENOUS | Status: AC

## 2021-01-09 NOTE — Progress Notes (Signed)
Called to room to assist during endoscopic procedure.  Patient ID and intended procedure confirmed with present staff. Received instructions for my participation in the procedure from the performing physician.  

## 2021-01-09 NOTE — Progress Notes (Signed)
Care partner took a picture of pt.  Photo deleted per my direction.  Pt. And care partner concerned that pt. 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 Record.  Pt.'s mother with the same name was the drug abuser.  Told pt. And care partner to pursue official deletion of that from pt.'s PCP, but I would note their request.

## 2021-01-09 NOTE — Op Note (Signed)
[facility] Patient Name: Jane CohnVanessa Ferguson-fuller Procedure Date: 01/09/2021 9:58 AM MRN: [id number] Endoscopist: Tressia DanasKimberly Ishaaq Penna MD, MD Age: 35 Referring MD:  Date of Birth: 01-06-1986 Gender: Female Account #: [id number] Procedure:                Upper GI endoscopy Indications:              Epigastric abdominal pain Medicines:                Monitored Anesthesia Care Procedure:                Pre-Anesthesia Assessment:                           - Prior to the procedure, a History and Physical                            was performed, and patient medications and                            allergies were reviewed. The patient's tolerance of                            previous anesthesia was also reviewed. The risks                            and benefits of the procedure and the sedation                            options and risks were discussed with the patient.                            All questions were answered, and informed consent                            was obtained. Prior Anticoagulants: The patient has                            taken no previous anticoagulant or antiplatelet                            agents. ASA Grade Assessment: II - A patient with                            mild systemic disease. After reviewing the risks                            and benefits, the patient was deemed in                            satisfactory condition to undergo the procedure.                           After obtaining informed consent, the endoscope was  passed under direct vision. Throughout the                            procedure, the patient's blood pressure, pulse, and                            oxygen saturations were monitored continuously. The                            GIF [id number] #[id number] was introduced through the                            mouth, and advanced to the third part of duodenum.                            The upper GI  endoscopy was accomplished without                            difficulty. The patient tolerated the procedure                            well. Scope In: Scope Out: Findings:                 The esophagus was normal.                           Diffuse mildly erythematous mucosa without bleeding                            was found in the gastric body. Biopsies were taken                            from the antrum, body, and fundus with a cold                            forceps for histology. Estimated blood loss was                            minimal.                           Localized mildly erythematous mucosa without active                            bleeding and with no stigmata of bleeding was found                            in the duodenal bulb. Biopsies were taken with a                            cold forceps for histology. Estimated blood loss                            was minimal.  The cardia and gastric fundus were normal on                            retroflexion.                           The exam was otherwise without abnormality. Complications:            No immediate complications. Estimated blood loss:                            Minimal. Estimated Blood Loss:     Estimated blood loss was minimal. Impression:               - Normal esophagus.                           - Erythematous mucosa in the gastric body. Biopsied.                           - Erythematous duodenopathy. Biopsied.                           - The examination was otherwise normal. Recommendation:           - Patient has a contact number available for                            emergencies. The signs and symptoms of potential                            delayed complications were discussed with the                            patient. Return to normal activities tomorrow.                            Written discharge instructions were provided to the                             patient.                           - Resume previous diet.                           - Continue present medications.                           - Await pathology results.                           - No aspirin, ibuprofen, naproxen, or other                            non-steroidal anti-inflammatory drugs.                           -  Follow-up with surgery to consider resection of                            cecal/TI abnormality that is likely endometriosis. Tressia Danas MD, MD 01/09/2021 10:16:28 AM This report has been signed electronically.

## 2021-01-09 NOTE — Progress Notes (Signed)
Medical history reviewed with no changes noted since PV. VS assessed by C.W 

## 2021-01-09 NOTE — Progress Notes (Signed)
A and O x3. Report to RN. Tolerated MAC anesthesia well.Teeth unchanged after procedure. 

## 2021-01-09 NOTE — Patient Instructions (Signed)
Impression/Recommendations:  Resume previous diet. Continue present medications. Await pathology results.   No aspirin, ibuprofen, naproxen, or other NSAID drugs.  Follow-up with surgery to consider resection of cecal/TI abnormality that is likely endometriosis.  YOU HAD AN ENDOSCOPIC PROCEDURE TODAY AT THE [facility]:   Refer to the procedure report that was given to you for any specific questions about what was found during the examination.  If the procedure report does not answer your questions, please call your gastroenterologist to clarify.  If you requested that your care partner not be given the details of your procedure findings, then the procedure report has been included in a sealed envelope for you to review at your convenience later.  YOU SHOULD EXPECT: Some feelings of bloating in the abdomen. Passage of more gas than usual.  Walking can help get rid of the air that was put into your GI tract during the procedure and reduce the bloating. If you had a lower endoscopy (such as a colonoscopy or flexible sigmoidoscopy) you may notice spotting of blood in your stool or on the toilet paper. If you underwent a bowel prep for your procedure, you may not have a normal bowel movement for a few days.  Please Note:  You might notice some irritation and congestion in your nose or some drainage.  This is from the oxygen used during your procedure.  There is no need for concern and it should clear up in a day or so.  SYMPTOMS TO REPORT IMMEDIATELY: Following upper endoscopy (EGD)  Vomiting of blood or coffee ground material  New chest pain or pain under the shoulder blades  Painful or persistently difficult swallowing  New shortness of breath  Fever of 100F or higher  Black, tarry-looking stools  For urgent or emergent issues, a gastroenterologist can be reached at any hour by calling [phone]. Do not use MyChart messaging for urgent concerns.    DIET:  We do recommend  a small meal at first, but then you may proceed to your regular diet.  Drink plenty of fluids but you should avoid alcoholic beverages for 24 hours.  ACTIVITY:  You should plan to take it easy for the rest of today and you should NOT DRIVE or use heavy machinery until tomorrow (because of the sedation medicines used during the test).    FOLLOW UP: Our staff will call the number listed on your records 48-72 hours following your procedure to check on you and address any questions or concerns that you may have regarding the information given to you following your procedure. If we do not reach you, we will leave a message.  We will attempt to reach you two times.  During this call, we will ask if you have developed any symptoms of COVID 19. If you develop any symptoms (ie: fever, flu-like symptoms, shortness of breath, cough etc.) before then, please call [phone].  If you test positive for Covid 19 in the 2 weeks post procedure, please call and report this information to [place].    If any biopsies were taken you will be contacted by phone or by letter within the next 1-3 weeks.  Please call us at [phone] if you have not heard about the biopsies in 3 weeks.    SIGNATURES/CONFIDENTIALITY: You and/or your care partner have signed paperwork which will be entered into your electronic medical record.  These signatures attest to the fact that that the information above on your After Visit Summary has been  reviewed and is understood.  Full responsibility of the confidentiality of this discharge information lies with you and/or your care-partner.

## 2021-01-14 ENCOUNTER — Telehealth: Payer: Self-pay

## 2021-01-14 NOTE — Telephone Encounter (Signed)
  Follow up Call-  Call back number 01/09/2021 02/21/2019  Post procedure Call Back phone  # [phone]  Permission to leave phone message Yes Yes  Some recent data might be hidden     Patient questions:  Do you have a fever, pain , or abdominal swelling? No. Pain Score  0 *  Have you tolerated food without any problems? Yes.    Have you been able to return to your normal activities? Yes.    Do you have any questions about your discharge instructions: Diet   No. Medications  No. Follow up visit  No.  Do you have questions or concerns about your Care? No.  Actions: * If pain score is 4 or above: No action needed, pain <4.

## 2021-01-27 ENCOUNTER — Encounter: Payer: Self-pay | Admitting: Gastroenterology

## 2021-02-17 ENCOUNTER — Other Ambulatory Visit (HOSPITAL_COMMUNITY): Payer: Self-pay | Admitting: General Surgery

## 2021-02-17 DIAGNOSIS — R1903 Right lower quadrant abdominal swelling, mass and lump: Secondary | ICD-10-CM

## 2021-02-24 ENCOUNTER — Ambulatory Visit: Payer: Self-pay | Admitting: General Surgery

## 2021-03-12 NOTE — Patient Instructions (Signed)
DUE TO COVID-19 ONLY ONE VISITOR IS ALLOWED TO COME WITH YOU AND STAY IN THE WAITING ROOM ONLY DURING PRE OP AND PROCEDURE.   **NO VISITORS ARE ALLOWED IN THE SHORT STAY AREA OR RECOVERY ROOM!!**  IF YOU WILL BE ADMITTED INTO THE HOSPITAL YOU ARE ALLOWED ONLY TWO SUPPORT PEOPLE DURING VISITATION HOURS ONLY (10AM -8PM)   The support person(s) may change daily. The support person(s) must pass our screening, gel in and out, and wear a mask at all times, including in the patient's room. Patients must also wear a mask when staff or their support person are in the room.  No visitors under the age of 2. Any visitor under the age of 77 must be accompanied by an adult.        Your procedure is scheduled on: 02/17/21   Report to [facility] Main  Entrance    Report to admitting at: 1:15 PM   Call this number if you have problems the morning of surgery [phone]   Do not eat food :After Midnight.   May have liquids until: 12:30 PM.    day of surgery  CLEAR LIQUID DIET  Foods Allowed                                                                     Foods Excluded  Water, Black Coffee and tea, regular and decaf                             liquids that you cannot  Plain Jell-O in any flavor  (No red)                                           see through such as: Fruit ices (not with fruit pulp)                                     milk, soups, orange juice              Iced Popsicles (No red)                                    All solid food                                   Apple juices Sports drinks like Gatorade (No red) Lightly seasoned clear broth or consume(fat free) Sugar  Sample Menu Breakfast                                Lunch                                     Supper Cranberry juice  Beef broth                            Chicken broth Jell-O                                     Grape juice                           Apple juice Coffee or tea                         Jell-O                                      Popsicle                                                Coffee or tea                        Coffee or tea      Oral Hygiene is also important to reduce your risk of infection.                                    Remember - BRUSH YOUR TEETH THE MORNING OF SURGERY WITH YOUR REGULAR TOOTHPASTE   Do NOT smoke after Midnight   Take these medicines the morning of surgery with A SIP OF WATER:   DO NOT TAKE ANY ORAL DIABETIC MEDICATIONS DAY OF YOUR SURGERY                              You may not have any metal on your body including hair pins, jewelry, and body piercing             Do not wear make-up, lotions, powders, perfumes/cologne, or deodorant  Do not wear nail polish including gel and S&S, artificial/acrylic nails, or any other type of covering on natural nails including finger and toenails. If you have artificial nails, gel coating, etc. that needs to be removed by a nail salon please have this removed prior to surgery or surgery may need to be canceled/ delayed if the surgeon/ anesthesia feels like they are unable to be safely monitored.   Do not shave  48 hours prior to surgery.    Do not bring valuables to the hospital. [facility] IS NOT             RESPONSIBLE   FOR VALUABLES.   Contacts, dentures or bridgework may not be worn into surgery.   Bring small overnight bag day of surgery.    Patients discharged the day of surgery will not be allowed to drive home.   Special Instructions: Bring a copy of your healthcare power of attorney and living will documents         the day of surgery if you haven't scanned them in before.  Please read over the following fact sheets you were given: IF YOU HAVE QUESTIONS ABOUT YOUR PRE OP INSTRUCTIONS PLEASE CALL [phone]   [facility] - Preparing for Surgery Before surgery, you can play an important role.  Because skin is not sterile, your skin needs to be as free of  germs as possible.  You can reduce the number of germs on your skin by washing with CHG (chlorahexidine gluconate) soap before surgery.  CHG is an antiseptic cleaner which kills germs and bonds with the skin to continue killing germs even after washing. Please DO NOT use if you have an allergy to CHG or antibacterial soaps.  If your skin becomes reddened/irritated stop using the CHG and inform your nurse when you arrive at Short Stay. Do not shave (including legs and underarms) for at least 48 hours prior to the first CHG shower.  You may shave your face/neck. Please follow these instructions carefully:  1.  Shower with CHG Soap the night before surgery and the  morning of Surgery.  2.  If you choose to wash your hair, wash your hair first as usual with your  normal  shampoo.  3.  After you shampoo, rinse your hair and body thoroughly to remove the  shampoo.                           4.  Use CHG as you would any other liquid soap.  You can apply chg directly  to the skin and wash                       Gently with a scrungie or clean washcloth.  5.  Apply the CHG Soap to your body ONLY FROM THE NECK DOWN.   Do not use on face/ open                           Wound or open sores. Avoid contact with eyes, ears mouth and genitals (private parts).                       Wash face,  Genitals (private parts) with your normal soap.             6.  Wash thoroughly, paying special attention to the area where your surgery  will be performed.  7.  Thoroughly rinse your body with warm water from the neck down.  8.  DO NOT shower/wash with your normal soap after using and rinsing off  the CHG Soap.                9.  Pat yourself dry with a clean towel.            10.  Wear clean pajamas.            11.  Place clean sheets on your bed the night of your first shower and do not  sleep with pets. Day of Surgery : Do not apply any lotions/deodorants the morning of surgery.  Please wear clean clothes to the  hospital/surgery center.  FAILURE TO FOLLOW THESE INSTRUCTIONS MAY RESULT IN THE CANCELLATION OF YOUR SURGERY PATIENT SIGNATURE_________________________________  NURSE SIGNATURE__________________________________  ________________________________________________________________________

## 2021-03-13 ENCOUNTER — Encounter (HOSPITAL_COMMUNITY)
Admission: RE | Admit: 2021-03-13 | Discharge: 2021-03-13 | Disposition: A | Discharge status: Home / Self Care | Payer: BC Managed Care – PPO | Source: Ambulatory Visit | Attending: General Surgery | Admitting: General Surgery

## 2021-03-13 ENCOUNTER — Other Ambulatory Visit: Payer: Self-pay

## 2021-03-13 ENCOUNTER — Encounter (HOSPITAL_COMMUNITY): Payer: Self-pay

## 2021-03-13 DIAGNOSIS — Z01812 Encounter for preprocedural laboratory examination: Secondary | ICD-10-CM | POA: Diagnosis not present

## 2021-03-13 HISTORY — DX: Anxiety disorder, unspecified: F41.9

## 2021-03-13 LAB — CBC
HCT: 41.2 % (ref 36.0–46.0)
Hemoglobin: 13.2 g/dL (ref 12.0–15.0)
MCH: 29.1 pg (ref 26.0–34.0)
MCHC: 32 g/dL (ref 30.0–36.0)
MCV: 90.7 fL (ref 80.0–100.0)
Platelets: 187 10*3/uL (ref 150–400)
RBC: 4.54 MIL/uL (ref 3.87–5.11)
RDW: 13.6 % (ref 11.5–15.5)
WBC: 4 10*3/uL (ref 4.0–10.5)
nRBC: 0 % (ref 0.0–0.2)

## 2021-03-13 NOTE — Progress Notes (Signed)
COVID Vaccine Completed: Yes Date COVID Vaccine completed: 07/2020. X 2 COVID vaccine manufacturer: Pfizer     Covid Test: NO  PCP - Dr. Catalina Antigua. Cardiologist -   Chest x-ray -  EKG -  Stress Test -  ECHO -  Cardiac Cath -  Pacemaker/ICD device last checked:  Sleep Study -  CPAP -   Fasting Blood Sugar -  Checks Blood Sugar _____ times a day  Blood Thinner Instructions: Aspirin Instructions: Last Dose:  Anesthesia review:   Patient denies shortness of breath, fever, cough and chest pain at PAT appointment   Patient verbalized understanding of instructions that were given to them at the PAT appointment. Patient was also instructed that they will need to review over the PAT instructions again at home before surgery.

## 2021-03-20 ENCOUNTER — Ambulatory Visit (HOSPITAL_COMMUNITY)
Admission: RE | Admit: 2021-03-20 | Discharge: 2021-03-20 | Disposition: A | Discharge status: Home / Self Care | Payer: BC Managed Care – PPO | Attending: General Surgery | Admitting: General Surgery

## 2021-03-20 ENCOUNTER — Encounter (HOSPITAL_COMMUNITY)
Admission: RE | Disposition: A | Discharge status: Home / Self Care | Payer: Self-pay | Source: Home / Self Care | Attending: General Surgery

## 2021-03-20 ENCOUNTER — Ambulatory Visit (HOSPITAL_COMMUNITY): Payer: BC Managed Care – PPO | Admitting: Certified Registered Nurse Anesthetist

## 2021-03-20 ENCOUNTER — Encounter (HOSPITAL_COMMUNITY): Payer: Self-pay | Admitting: General Surgery

## 2021-03-20 DIAGNOSIS — Z885 Allergy status to narcotic agent status: Secondary | ICD-10-CM | POA: Diagnosis not present

## 2021-03-20 DIAGNOSIS — Z79899 Other long term (current) drug therapy: Secondary | ICD-10-CM | POA: Insufficient documentation

## 2021-03-20 DIAGNOSIS — Z809 Family history of malignant neoplasm, unspecified: Secondary | ICD-10-CM | POA: Diagnosis not present

## 2021-03-20 DIAGNOSIS — G43909 Migraine, unspecified, not intractable, without status migrainosus: Secondary | ICD-10-CM | POA: Diagnosis not present

## 2021-03-20 DIAGNOSIS — K6389 Other specified diseases of intestine: Secondary | ICD-10-CM | POA: Diagnosis not present

## 2021-03-20 DIAGNOSIS — K219 Gastro-esophageal reflux disease without esophagitis: Secondary | ICD-10-CM | POA: Diagnosis not present

## 2021-03-20 DIAGNOSIS — Z823 Family history of stroke: Secondary | ICD-10-CM | POA: Diagnosis not present

## 2021-03-20 DIAGNOSIS — Z881 Allergy status to other antibiotic agents status: Secondary | ICD-10-CM | POA: Diagnosis not present

## 2021-03-20 DIAGNOSIS — E559 Vitamin D deficiency, unspecified: Secondary | ICD-10-CM | POA: Diagnosis not present

## 2021-03-20 DIAGNOSIS — R1900 Intra-abdominal and pelvic swelling, mass and lump, unspecified site: Secondary | ICD-10-CM | POA: Diagnosis not present

## 2021-03-20 DIAGNOSIS — Z8249 Family history of ischemic heart disease and other diseases of the circulatory system: Secondary | ICD-10-CM | POA: Insufficient documentation

## 2021-03-20 DIAGNOSIS — K660 Peritoneal adhesions (postprocedural) (postinfection): Secondary | ICD-10-CM | POA: Insufficient documentation

## 2021-03-20 DIAGNOSIS — Z800 Family history of malignant neoplasm of digestive organs: Secondary | ICD-10-CM | POA: Insufficient documentation

## 2021-03-20 HISTORY — PX: LAPAROSCOPY: SHX197

## 2021-03-20 LAB — PREGNANCY, URINE: Preg Test, Ur: NEGATIVE

## 2021-03-20 SURGERY — LAPAROSCOPY, DIAGNOSTIC
Anesthesia: General | Site: Abdomen

## 2021-03-20 MED ORDER — FENTANYL CITRATE PF 50 MCG/ML IJ SOSY
PREFILLED_SYRINGE | INTRAMUSCULAR | Status: AC
  Filled 2021-03-20: qty 1

## 2021-03-20 MED ORDER — ROCURONIUM BROMIDE 10 MG/ML (PF) SYRINGE
PREFILLED_SYRINGE | INTRAVENOUS | Status: AC
  Filled 2021-03-20: qty 10

## 2021-03-20 MED ORDER — MIDAZOLAM HCL 5 MG/5ML IJ SOLN
INTRAMUSCULAR | Status: DC | PRN
  Administered 2021-03-20: 2 mg via INTRAVENOUS

## 2021-03-20 MED ORDER — FENTANYL CITRATE (PF) 100 MCG/2ML IJ SOLN
INTRAMUSCULAR | Status: DC | PRN
  Administered 2021-03-20: 50 ug via INTRAVENOUS
  Administered 2021-03-20: 100 ug via INTRAVENOUS
  Administered 2021-03-20: 50 ug via INTRAVENOUS
  Administered 2021-03-20: 100 ug via INTRAVENOUS

## 2021-03-20 MED ORDER — PROMETHAZINE HCL 25 MG/ML IJ SOLN: 6.2500 mg | INTRAMUSCULAR | Status: DC | PRN

## 2021-03-20 MED ORDER — CEFAZOLIN SODIUM-DEXTROSE 2-4 GM/100ML-% IV SOLN
2.0000 g | INTRAVENOUS | Status: AC
  Administered 2021-03-20: 2 g via INTRAVENOUS
  Filled 2021-03-20: qty 100

## 2021-03-20 MED ORDER — FENTANYL CITRATE PF 50 MCG/ML IJ SOSY
25.0000 ug | PREFILLED_SYRINGE | INTRAMUSCULAR | Status: DC | PRN
  Administered 2021-03-20 (×2): 50 ug via INTRAVENOUS

## 2021-03-20 MED ORDER — LIDOCAINE 2% (20 MG/ML) 5 ML SYRINGE
INTRAMUSCULAR | Status: DC | PRN
  Administered 2021-03-20: 50 mg via INTRAVENOUS

## 2021-03-20 MED ORDER — DEXAMETHASONE SODIUM PHOSPHATE 4 MG/ML IJ SOLN
INTRAMUSCULAR | Status: DC | PRN
  Administered 2021-03-20: 10 mg via INTRAVENOUS

## 2021-03-20 MED ORDER — LACTATED RINGERS IR SOLN
Status: DC | PRN
  Administered 2021-03-20: 1000 mL

## 2021-03-20 MED ORDER — ROCURONIUM BROMIDE 10 MG/ML (PF) SYRINGE
PREFILLED_SYRINGE | INTRAVENOUS | Status: DC | PRN
  Administered 2021-03-20: 50 mg via INTRAVENOUS
  Administered 2021-03-20: 10 mg via INTRAVENOUS

## 2021-03-20 MED ORDER — BUPIVACAINE-EPINEPHRINE (PF) 0.25% -1:200000 IJ SOLN
INTRAMUSCULAR | Status: AC
  Filled 2021-03-20: qty 30

## 2021-03-20 MED ORDER — SUGAMMADEX SODIUM 200 MG/2ML IV SOLN
INTRAVENOUS | Status: DC | PRN
  Administered 2021-03-20: 200 mg via INTRAVENOUS

## 2021-03-20 MED ORDER — ONDANSETRON HCL 4 MG/2ML IJ SOLN
INTRAMUSCULAR | Status: DC | PRN
  Administered 2021-03-20: 4 mg via INTRAVENOUS

## 2021-03-20 MED ORDER — LACTATED RINGERS IV SOLN: INTRAVENOUS | Status: DC

## 2021-03-20 MED ORDER — FENTANYL CITRATE (PF) 100 MCG/2ML IJ SOLN
INTRAMUSCULAR | Status: AC
  Filled 2021-03-20: qty 2

## 2021-03-20 MED ORDER — SCOPOLAMINE 1 MG/3DAYS TD PT72
1.0000 | MEDICATED_PATCH | Freq: Once | TRANSDERMAL | Status: DC
  Administered 2021-03-20: 1.5 mg via TRANSDERMAL
  Filled 2021-03-20: qty 1

## 2021-03-20 MED ORDER — TRAMADOL HCL 50 MG PO TABS: 50.0000 mg | ORAL_TABLET | Freq: Four times a day (QID) | ORAL | 0 refills | Status: DC | PRN

## 2021-03-20 MED ORDER — 0.9 % SODIUM CHLORIDE (POUR BTL) OPTIME
TOPICAL | Status: DC | PRN
  Administered 2021-03-20: 1000 mL

## 2021-03-20 MED ORDER — PROPOFOL 10 MG/ML IV BOLUS
INTRAVENOUS | Status: AC
  Filled 2021-03-20: qty 20

## 2021-03-20 MED ORDER — ORAL CARE MOUTH RINSE
15.0000 mL | Freq: Once | OROMUCOSAL | Status: AC
Start: 2021-03-20 — End: 2021-03-20

## 2021-03-20 MED ORDER — PROPOFOL 10 MG/ML IV BOLUS
INTRAVENOUS | Status: DC | PRN
  Administered 2021-03-20: 150 mg via INTRAVENOUS

## 2021-03-20 MED ORDER — MIDAZOLAM HCL 2 MG/2ML IJ SOLN
INTRAMUSCULAR | Status: AC
  Filled 2021-03-20: qty 2

## 2021-03-20 MED ORDER — LIDOCAINE 2% (20 MG/ML) 5 ML SYRINGE
INTRAMUSCULAR | Status: AC
  Filled 2021-03-20: qty 5

## 2021-03-20 MED ORDER — ONDANSETRON HCL 4 MG/2ML IJ SOLN
INTRAMUSCULAR | Status: AC
  Filled 2021-03-20: qty 2

## 2021-03-20 MED ORDER — ACETAMINOPHEN 500 MG PO TABS
1000.0000 mg | ORAL_TABLET | Freq: Once | ORAL | Status: AC
  Administered 2021-03-20: 1000 mg via ORAL
  Filled 2021-03-20: qty 2

## 2021-03-20 MED ORDER — SODIUM CHLORIDE 0.9% FLUSH: 3.0000 mL | Freq: Two times a day (BID) | INTRAVENOUS | Status: DC

## 2021-03-20 MED ORDER — CHLORHEXIDINE GLUCONATE 0.12 % MT SOLN
15.0000 mL | Freq: Once | OROMUCOSAL | Status: AC
  Administered 2021-03-20: 15 mL via OROMUCOSAL

## 2021-03-20 MED ORDER — DEXAMETHASONE SODIUM PHOSPHATE 10 MG/ML IJ SOLN
INTRAMUSCULAR | Status: AC
  Filled 2021-03-20: qty 1

## 2021-03-20 SURGICAL SUPPLY — 52 items
ADH SKN CLS APL DERMABOND .7 (GAUZE/BANDAGES/DRESSINGS) ×1
APPLIER CLIP 5 13 M/L LIGAMAX5 (MISCELLANEOUS)
APPLIER CLIP ROT 10 11.4 M/L (STAPLE)
APR CLP MED LRG 11.4X10 (STAPLE)
APR CLP MED LRG 5 ANG JAW (MISCELLANEOUS)
BAG COUNTER SPONGE SURGICOUNT (BAG) IMPLANT
BAG SPNG CNTER NS LX DISP (BAG)
BAG SURGICOUNT SPONGE COUNTING (BAG)
BLADE EXTENDED COATED 6.5IN (ELECTRODE) IMPLANT
CLIP APPLIE 5 13 M/L LIGAMAX5 (MISCELLANEOUS) IMPLANT
CLIP APPLIE ROT 10 11.4 M/L (STAPLE) IMPLANT
COVER MAYO STAND STRL (DRAPES) ×3 IMPLANT
DECANTER SPIKE VIAL GLASS SM (MISCELLANEOUS) IMPLANT
DERMABOND ADVANCED (GAUZE/BANDAGES/DRESSINGS) ×2
DERMABOND ADVANCED .7 DNX12 (GAUZE/BANDAGES/DRESSINGS) ×1 IMPLANT
ELECT REM PT RETURN 15FT ADLT (MISCELLANEOUS) ×3 IMPLANT
GAUZE SPONGE 4X4 12PLY STRL (GAUZE/BANDAGES/DRESSINGS) ×3 IMPLANT
GLOVE SURG ENC MOIS LTX SZ6.5 (GLOVE) ×6 IMPLANT
GLOVE SURG UNDER POLY LF SZ7 (GLOVE) ×3 IMPLANT
GOWN STRL REUS W/TWL XL LVL3 (GOWN DISPOSABLE) ×9 IMPLANT
HANDLE SUCTION POOLE (INSTRUMENTS) IMPLANT
IRRIG SUCT STRYKERFLOW 2 WTIP (MISCELLANEOUS) ×3
IRRIGATION SUCT STRKRFLW 2 WTP (MISCELLANEOUS) ×1 IMPLANT
KIT TURNOVER KIT A (KITS) ×3 IMPLANT
PACK COLON (CUSTOM PROCEDURE TRAY) ×3 IMPLANT
SEALER TISSUE G2 STRG ARTC 35C (ENDOMECHANICALS) IMPLANT
SET TUBE SMOKE EVAC HIGH FLOW (TUBING) ×3 IMPLANT
SLEEVE XCEL OPT CAN 5 100 (ENDOMECHANICALS) ×6 IMPLANT
SPONGE T-LAP 18X18 ~~LOC~~+RFID (SPONGE) ×3 IMPLANT
STAPLER VISISTAT 35W (STAPLE) IMPLANT
SUCTION POOLE HANDLE (INSTRUMENTS)
SUT NOVA 1 T20/GS 25DT (SUTURE) IMPLANT
SUT PDS AB 1 CT1 27 (SUTURE) IMPLANT
SUT PDS AB 1 TP1 96 (SUTURE) IMPLANT
SUT PROLENE 2 0 KS (SUTURE) IMPLANT
SUT PROLENE 2 0 SH DA (SUTURE) IMPLANT
SUT SILK 2 0 (SUTURE) ×3
SUT SILK 2 0 SH CR/8 (SUTURE) ×3 IMPLANT
SUT SILK 2-0 18XBRD TIE 12 (SUTURE) ×1 IMPLANT
SUT SILK 3 0 (SUTURE) ×3
SUT SILK 3 0 SH CR/8 (SUTURE) ×3 IMPLANT
SUT SILK 3-0 18XBRD TIE 12 (SUTURE) ×1 IMPLANT
SUT V-LOC BARB 180 2/0GR6 GS22 (SUTURE) ×3
SUTURE V-LC BRB 180 2/0GR6GS22 (SUTURE) ×1 IMPLANT
TOWEL OR 17X26 10 PK STRL BLUE (TOWEL DISPOSABLE) ×3 IMPLANT
TOWEL OR NON WOVEN STRL DISP B (DISPOSABLE) ×3 IMPLANT
TRAY FOLEY MTR SLVR 16FR STAT (SET/KITS/TRAYS/PACK) ×3 IMPLANT
TROCAR BLADELESS OPT 5 100 (ENDOMECHANICALS) ×3 IMPLANT
TROCAR XCEL BLUNT TIP 100MML (ENDOMECHANICALS) IMPLANT
TROCAR XCEL NON-BLD 11X100MML (ENDOMECHANICALS) IMPLANT
TUBING CONNECTING 10 (TUBING) IMPLANT
TUBING CONNECTING 10' (TUBING)

## 2021-03-20 NOTE — Anesthesia Preprocedure Evaluation (Addendum)
Anesthesia Evaluation  Patient identified by MRN, date of birth, ID band Patient awake    Reviewed: Allergy & Precautions, NPO status , Patient's Chart, lab work & pertinent test results  Airway Mallampati: I  TM Distance: >3 FB Neck ROM: Full    Dental  (+) Teeth Intact, Dental Advisory Given   Pulmonary neg pulmonary ROS,    Pulmonary exam normal breath sounds clear to auscultation       Cardiovascular negative cardio ROS Normal cardiovascular exam Rhythm:Regular Rate:Normal     Neuro/Psych  Headaches, PSYCHIATRIC DISORDERS Anxiety    GI/Hepatic Neg liver ROS, GERD  Medicated,Abdominal mass   Endo/Other  negative endocrine ROS  Renal/GU negative Renal ROS Bladder dysfunction      Musculoskeletal negative musculoskeletal ROS (+)   Abdominal   Peds  Hematology negative hematology ROS (+)   Anesthesia Other Findings Day of surgery medications reviewed with the patient.  Reproductive/Obstetrics                            Anesthesia Physical Anesthesia Plan  ASA: 2  Anesthesia Plan: General   Post-op Pain Management:    Induction: Intravenous  PONV Risk Score and Plan: 4 or greater and Midazolam, Dexamethasone and Ondansetron  Airway Management Planned: Oral ETT  Additional Equipment:   Intra-op Plan:   Post-operative Plan: Extubation in OR  Informed Consent: I have reviewed the patients History and Physical, chart, labs and discussed the procedure including the risks, benefits and alternatives for the proposed anesthesia with the patient or authorized representative who has indicated his/her understanding and acceptance.     Dental advisory given  Plan Discussed with: CRNA  Anesthesia Plan Comments:         Anesthesia Quick Evaluation

## 2021-03-20 NOTE — Discharge Instructions (Addendum)
LAPAROSCOPIC SURGERY: POST OP INSTRUCTIONS  DIET: Follow a light bland diet the first 24 hours after arrival home, such as soup, liquids, crackers, etc.  Be sure to include lots of fluids daily.  Avoid fast food or heavy meals as your are more likely to get nauseated.  Eat a low fat the next few days after surgery.   Take your usually prescribed home medications unless otherwise directed. PAIN CONTROL: Pain is best controlled by a usual combination of three different methods TOGETHER: Ice/Heat Over the counter pain medication Prescription pain medication Most patients will experience some swelling and bruising around the incisions.  Ice packs or heating pads (30-60 minutes up to 6 times a day) will help. Use ice for the first few days to help decrease swelling and bruising, then switch to heat to help relax tight/sore spots and speed recovery.  Some people prefer to use ice alone, heat alone, alternating between ice & heat.  Experiment to what works for you.  Swelling and bruising can take several weeks to resolve.   It is helpful to take an over-the-counter pain medication regularly for the first few weeks.  Choose one of the following that works best for you: Naproxen (Aleve, etc)  Two 220mg tabs twice a day Ibuprofen (Advil, etc) Three 200mg tabs four times a day (every meal & bedtime) A  prescription for pain medication (such as percocet, vicodin, oxycodone, hydrocodone, etc) should be given to you upon discharge.  Take your pain medication as prescribed.  If you are having problems/concerns with the prescription medicine (does not control pain, nausea, vomiting, rash, itching, etc), please call us [phone] to see if we need to switch you to a different pain medicine that will work better for you and/or control your side effect better. If you need a refill on your pain medication, please contact your pharmacy.  They will contact our office to request authorization. Prescriptions will not be  filled after 5 pm or on week-ends.   Avoid getting constipated.  Between the surgery and the pain medications, it is common to experience some constipation.  Increasing fluid intake and taking a fiber supplement (such as Metamucil, Citrucel, FiberCon, MiraLax, etc) 1-2 times a day regularly will usually help prevent this problem from occurring.  A mild laxative (prune juice, Milk of Magnesia, MiraLax, etc) should be taken according to package directions if there are no bowel movements after 48 hours.   Watch out for diarrhea.  If you have many loose bowel movements, simplify your diet to bland foods & liquids for a few days.  Stop any stool softeners and decrease your fiber supplement.  Switching to mild anti-diarrheal medications (Kayopectate, Pepto Bismol) can help.  If this worsens or does not improve, please call us. Wash / shower every day.  You may shower over the dressings as they are waterproof.  Continue to shower over incision(s) after the dressing is off. Remove your waterproof bandages 5 days after surgery.  You may leave the incision open to air.  You may replace a dressing/Band-Aid to cover the incision for comfort if you wish.  ACTIVITIES as tolerated:   You may resume regular (light) daily activities beginning the next day--such as daily self-care, walking, climbing stairs--gradually increasing activities as tolerated.  If you can walk 30 minutes without difficulty, it is safe to try more intense activity such as jogging, treadmill, bicycling, low-impact aerobics, swimming, etc. Save the most intensive and strenuous activity for last such as sit-ups, heavy   lifting, contact sports, etc  Refrain from any heavy lifting or straining until you are off narcotics for pain control.   DO NOT PUSH THROUGH PAIN.  Let pain be your guide: If it hurts to do something, don't do it.  Pain is your body warning you to avoid that activity for another week until the pain goes down. You may drive when you are  no longer taking prescription pain medication, you can comfortably wear a seatbelt, and you can safely maneuver your car and apply brakes. You may have sexual intercourse when it is comfortable.  FOLLOW UP in our office Please call [facility] at [phone] to set up an appointment to see your surgeon in the office for a follow-up appointment approximately 2-3 weeks after your surgery. Make sure that you call for this appointment the day you arrive home to insure a convenient appointment time. 10. IF YOU HAVE DISABILITY OR FAMILY LEAVE FORMS, BRING THEM TO THE OFFICE FOR PROCESSING.  DO NOT GIVE THEM TO YOUR DOCTOR.   WHEN TO CALL US [phone]: Poor pain control Reactions / problems with new medications (rash/itching, nausea, etc)  Fever over 101.5 F (38.5 C) Inability to urinate Nausea and/or vomiting Worsening swelling or bruising Continued bleeding from incision. Increased pain, redness, or drainage from the incision   The clinic staff is available to answer your questions during regular business hours (8:30am-5pm).  Please don't hesitate to call and ask to speak to one of our nurses for clinical concerns.   If you have a medical emergency, go to the nearest emergency room or call 911.  A surgeon from [facility] is always on call at the hospitals   [facility] [address], [address], [place], [place]  [address] ? MAIN: [phone] ? TOLL FREE: [phone] ?  FAX [phone] [email or web address]   

## 2021-03-20 NOTE — Transfer of Care (Signed)
Immediate Anesthesia Transfer of Care Note  Patient: Jane [name]  Procedure(s) Performed: DIAGNOSTIC LAPAROSCOPY AND BIOPSY (Abdomen)  Patient Location: PACU  Anesthesia Type:General  Level of Consciousness: drowsy  Airway & Oxygen Therapy: Patient Spontanous Breathing and Patient connected to face mask  Post-op Assessment: Report given to RN and Post -op Vital signs reviewed and stable  Post vital signs: Reviewed and stable  Last Vitals:  Vitals Value Taken Time  BP 144/85 03/20/21 1552  Temp    Pulse 105 03/20/21 1553  Resp 18 03/20/21 1553  SpO2 100 % 03/20/21 1553  Vitals shown include unvalidated device data.  Last Pain:  Vitals:   03/20/21 1406  TempSrc:   PainSc: 0-No pain      Patients Stated Pain Goal: 3 (03/20/21 1406)  Complications: No notable events documented.

## 2021-03-20 NOTE — Op Note (Signed)
03/20/2021  3:43 PM  PATIENT:  Jane [name]  35 y.o. female  Patient Care Team: Leilani Able, MD as PCP - General (Family Medicine) Constant, Gigi Gin, MD as Consulting Physician (Obstetrics and Gynecology)  PRE-OPERATIVE DIAGNOSIS:  ABDOMINAL MASS  POST-OPERATIVE DIAGNOSIS:  INFLAMMATORY ABDOMINAL MASS  PROCEDURE:   DIAGNOSTIC LAPAROSCOPY AND BIOPSY  SURGEON:  Surgeon(s): Romie Levee, MD  ASSISTANT: none   ANESTHESIA:   local and general  EBL: 10 ml  Total I/O In: 160 [I.V.:60; IV Piggyback:100] Out: -   DRAINS: none   SPECIMEN:  Source of Specimen:  inflammatory mass of ileocecal region  DISPOSITION OF SPECIMEN:  PATHOLOGY  COUNTS:  YES  PLAN OF CARE: Discharge to home after PACU  PATIENT DISPOSITION:  PACU - hemodynamically stable.  INDICATION: 35 y.o. F with stable RLQ mass   OR FINDINGS: Inflammatory appearing process involving the terminal ileum and cecum  DESCRIPTION: the patient was identified in the preoperative holding area and taken to the OR where they were laid supine on the operating room table.  General anesthesia was induced without difficulty. SCDs were also noted to be in place prior to the initiation of anesthesia.  The patient was then prepped and draped in the usual sterile fashion.   A surgical timeout was performed indicating the correct patient, procedure, positioning and need for preoperative antibiotics.   I began by making a small left upper quadrant incision and inserting a varies needle for abdominal insufflation.  Insertion was clean.  Abdomen was insufflated to approximately 15 mmHg and a 5 mm port was placed.  Area of insertion was inspected.  There was no active bleeding or injury to surrounding structures.  I placed 2 other 5 mm ports in the left side of the abdomen.  I began a four-quadrant inspection of the abdomen.  The right upper quadrant and left upper quadrants appeared normal.  The left lower quadrant also  appeared normal.  The pelvis showed some scarring.  There was a mass adherent to the right lower quadrant.  This involved the terminal ileum and cecum.  The omentum was carefully dissected away.  The terminal ileum appeared adherent but without obstruction to the cecum and the appendix was not visualized.  There were several blue cystic structures surrounding the area.  There were also several areas of inflammation on the terminal ileum.  I attempted to biopsy the cystic areas.  I was unable to get a decent specimen so I decided to biopsy a cecal wall specimen using laparoscopic scissors.  The biopsy site was then closed using a 2-0 V-Loc suture laparoscopically.  Once this was completed hemostasis was good.  The abdomen was irrigated.  There was no concerning inflammation around the inguinal region.  The retroperitoneum appeared normal as well.  At this point the abdomen was desufflated.  Ports were removed.  5 mm port sites were closed using interrupted 4-0 Vicryl sutures and Dermabond.  Patient was then awakened from anesthesia and sent to the postanesthesia care unit in stable condition.  All counts were correct per operating room staff.

## 2021-03-20 NOTE — Anesthesia Procedure Notes (Signed)
Procedure Name: Intubation Date/Time: 03/20/2021 2:48 PM Performed by: Senya Bellaire, CRNA Pre-anesthesia Checklist: Patient identified, Emergency Drugs available, Suction available and Patient being monitored Patient Re-evaluated:Patient Re-evaluated prior to induction Oxygen Delivery Method: Circle system utilized Preoxygenation: Pre-oxygenation with 100% oxygen Induction Type: IV induction Ventilation: Mask ventilation without difficulty Laryngoscope Size: 2 and Miller Grade View: Grade I Tube type: Oral Tube size: 7.0 mm Number of attempts: 1 Airway Equipment and Method: Stylet Placement Confirmation: ETT inserted through vocal cords under direct vision, positive ETCO2 and breath sounds checked- equal and bilateral Secured at: 22 cm Tube secured with: Tape Dental Injury: Teeth and Oropharynx as per pre-operative assessment

## 2021-03-20 NOTE — Anesthesia Postprocedure Evaluation (Signed)
Anesthesia Post Note  Patient: Jane [name]  Procedure(s) Performed: DIAGNOSTIC LAPAROSCOPY AND BIOPSY (Abdomen)     Patient location during evaluation: PACU Anesthesia Type: General Level of consciousness: awake and alert Pain management: pain level controlled Vital Signs Assessment: post-procedure vital signs reviewed and stable Respiratory status: spontaneous breathing, nonlabored ventilation, respiratory function stable and patient connected to nasal cannula oxygen Cardiovascular status: blood pressure returned to baseline and stable Postop Assessment: no apparent nausea or vomiting Anesthetic complications: no   No notable events documented.  Last Vitals:  Vitals:   03/20/21 1552 03/20/21 1600  BP: (!) 144/85 139/76  Pulse: (!) 105 99  Resp: 18 17  Temp: 36.6 C   SpO2: 100% 100%    Last Pain:  Vitals:   03/20/21 1600  TempSrc:   PainSc: Asleep                 Cecile Hearing

## 2021-03-20 NOTE — H&P (Signed)
History of Present Illness: Jane [name] is a 35 y.o. female who is here today for f/u of RLQ pain.  CT shows a stable mass for the past couple years.  On previous exams, this has been encasing her femoral artery and ureter.  It appears she saw her gynecologist and was placed on Depo for this but reports no change in symptoms.  She describes pain in her RLQ that radiates to her right leg     Past Medical History:  Diagnosis Date   Anemia    childhood   Anxiety    Bladder infection    Endometriosis    GERD (gastroesophageal reflux disease)    Migraine    UTI (lower urinary tract infection)    Past Surgical History:  Procedure Laterality Date   COLONOSCOPY     2020   ESOPHAGOGASTRODUODENOSCOPY ENDOSCOPY     UMBILICAL HERNIA REPAIR     Social History   Socioeconomic History   Marital status: Legally Separated    Spouse name: Not on file   Number of children: Not on file   Years of education: Not on file   Highest education level: Not on file  Occupational History   Not on file  Tobacco Use   Smoking status: Never   Smokeless tobacco: Never  Vaping Use   Vaping Use: Never used  Substance and Sexual Activity   Alcohol use: Yes    Comment: [facility].   Drug use: No   Sexual activity: Yes    Birth control/protection: Injection  Other Topics Concern   Not on file  Social History Narrative   Not on file   Social Determinants of Health   Financial Resource Strain: Not on file  Food Insecurity: Not on file  Transportation Needs: Not on file  Physical Activity: Not on file  Stress: Not on file  Social Connections: Not on file  Intimate Partner Violence: Not on file   Family History  Problem Relation Age of Onset   Endometriosis Sister    Hypertension Maternal Grandmother    Stroke Maternal Grandmother    Cancer Maternal Grandmother    Cancer Paternal Grandmother    Stomach cancer Other    Rectal cancer Other    Esophageal cancer Other    Colon cancer Other     Current Facility-Administered Medications for the 03/20/21 encounter [facility] Encounter)  Medication   0.9 %  sodium chloride infusion   Current Meds  Medication Sig   acetaminophen (TYLENOL) 500 MG tablet Take 1,000 mg by mouth every 6 (six) hours as needed for moderate pain (Cramps).   Cholecalciferol (VITAMIN D) 125 MCG (5000 UT) CAPS Take 1 capsule by mouth daily.   famotidine (PEPCID) 20 MG tablet Take 1 tablet (20 mg total) by mouth 2 (two) times daily.   omeprazole (PRILOSEC) 40 MG capsule Take 1 capsule (40 mg total) by mouth daily. (Patient taking differently: Take 1 capsule by mouth in the morning and at bedtime.)   pseudoephedrine-acetaminophen (TYLENOL SINUS) 30-500 MG TABS tablet Take 1 tablet by mouth every 4 (four) hours as needed (migraine).   sucralfate (CARAFATE) 1 g tablet Take 1 g by mouth 2 (two) times daily.    Allergies  Allergen Reactions   Toradol [Ketorolac Tromethamine] Anxiety    suicidal   Clindamycin/Lincomycin Hives   Vicodin [Hydrocodone-Acetaminophen] Nausea And Vomiting   Review of Systems - General ROS: negative Respiratory ROS: no cough, shortness of breath, or wheezing Cardiovascular ROS: no chest pain or  dyspnea on exertion Gastrointestinal ROS: no abdominal pain, change in bowel habits, or black or bloody stools Genito-Urinary ROS: no dysuria, trouble voiding, or hematuria      Objective:      BP 131/87   Pulse 90   Temp 99.1 F (37.3 C) (Oral)   Resp 18   Ht 5' 6.75" (1.695 m)   Wt 60.3 kg   LMP  (LMP Unknown)   SpO2 100%   BMI 20.99 kg/m     General appearance - alert, well appearing, and in no distress CV: RRR Lungs: CTA Abdomen - mild tenderness noted RLQ     Labs, Imaging and Diagnostic Testing:   CT reviewed: limited study due to no IV constrast   Assessment and Plan:  Diagnoses and all orders for this visit:      This appears most consistent with a Gyn issue and endometreosis but should be biopsied, given its  persistence.  Interventional radiology does not feel this can be reached via a biopsy needle.  We have decided to perform a diagnostic laparoscopy with possible biopsy.  Risks include bleeding, infection, damage to adjacent structures and persistent symptoms.  I believe she understands this and wishes to proceed with surgery.

## 2021-03-21 ENCOUNTER — Encounter (HOSPITAL_COMMUNITY): Payer: Self-pay | Admitting: General Surgery

## 2021-03-24 LAB — SURGICAL PATHOLOGY

## 2021-04-01 DIAGNOSIS — R1011 Right upper quadrant pain: Secondary | ICD-10-CM | POA: Diagnosis not present

## 2021-04-01 DIAGNOSIS — R0689 Other abnormalities of breathing: Secondary | ICD-10-CM | POA: Diagnosis not present

## 2021-04-01 DIAGNOSIS — R000 Tachycardia, unspecified: Secondary | ICD-10-CM | POA: Diagnosis not present

## 2021-04-16 ENCOUNTER — Other Ambulatory Visit: Payer: Self-pay

## 2021-04-16 ENCOUNTER — Emergency Department (HOSPITAL_COMMUNITY): Payer: BC Managed Care – PPO

## 2021-04-16 ENCOUNTER — Emergency Department (HOSPITAL_COMMUNITY)
Admission: EM | Admit: 2021-04-16 | Discharge: 2021-04-17 | Disposition: A | Discharge status: Home / Self Care | Payer: BC Managed Care – PPO | Attending: Emergency Medicine | Admitting: Emergency Medicine

## 2021-04-16 ENCOUNTER — Encounter (HOSPITAL_COMMUNITY): Payer: Self-pay

## 2021-04-16 DIAGNOSIS — R112 Nausea with vomiting, unspecified: Secondary | ICD-10-CM | POA: Insufficient documentation

## 2021-04-16 DIAGNOSIS — K219 Gastro-esophageal reflux disease without esophagitis: Secondary | ICD-10-CM | POA: Diagnosis not present

## 2021-04-16 DIAGNOSIS — N809 Endometriosis, unspecified: Secondary | ICD-10-CM | POA: Diagnosis not present

## 2021-04-16 DIAGNOSIS — R1084 Generalized abdominal pain: Secondary | ICD-10-CM | POA: Diagnosis not present

## 2021-04-16 DIAGNOSIS — R109 Unspecified abdominal pain: Secondary | ICD-10-CM | POA: Diagnosis not present

## 2021-04-16 LAB — URINALYSIS, ROUTINE W REFLEX MICROSCOPIC
Bilirubin Urine: NEGATIVE
Glucose, UA: NEGATIVE mg/dL
Hgb urine dipstick: NEGATIVE
Ketones, ur: NEGATIVE mg/dL
Leukocytes,Ua: NEGATIVE
Nitrite: NEGATIVE
Protein, ur: NEGATIVE mg/dL
Specific Gravity, Urine: 1.01 (ref 1.005–1.030)
pH: 7 (ref 5.0–8.0)

## 2021-04-16 LAB — COMPREHENSIVE METABOLIC PANEL
ALT: 15 U/L (ref 0–44)
AST: 18 U/L (ref 15–41)
Albumin: 4.3 g/dL (ref 3.5–5.0)
Alkaline Phosphatase: 47 U/L (ref 38–126)
Anion gap: 10 (ref 5–15)
BUN: 5 mg/dL — ABNORMAL LOW (ref 6–20)
CO2: 22 mmol/L (ref 22–32)
Calcium: 9.6 mg/dL (ref 8.9–10.3)
Chloride: 107 mmol/L (ref 98–111)
Creatinine, Ser: 0.68 mg/dL (ref 0.44–1.00)
GFR, Estimated: >60 mL/min (ref >60)
Glucose, Bld: 91 mg/dL (ref 70–99)
Potassium: 4.3 mmol/L (ref 3.5–5.1)
Sodium: 139 mmol/L (ref 135–145)
Total Bilirubin: 0.9 mg/dL (ref 0.3–1.2)
Total Protein: 7.6 g/dL (ref 6.5–8.1)

## 2021-04-16 LAB — CBC
HCT: 39.2 % (ref 36.0–46.0)
Hemoglobin: 12.5 g/dL (ref 12.0–15.0)
MCH: 29.2 pg (ref 26.0–34.0)
MCHC: 31.9 g/dL (ref 30.0–36.0)
MCV: 91.6 fL (ref 80.0–100.0)
Platelets: 184 10*3/uL (ref 150–400)
RBC: 4.28 MIL/uL (ref 3.87–5.11)
RDW: 13.5 % (ref 11.5–15.5)
WBC: 4.7 10*3/uL (ref 4.0–10.5)
nRBC: 0 % (ref 0.0–0.2)

## 2021-04-16 LAB — I-STAT BETA HCG BLOOD, ED (MC, WL, AP ONLY): I-stat hCG, quantitative: <5 m[IU]/mL (ref <5)

## 2021-04-16 LAB — LIPASE, BLOOD: Lipase: 49 U/L (ref 11–51)

## 2021-04-16 MED ORDER — OXYCODONE-ACETAMINOPHEN 5-325 MG PO TABS: 1.0000 | ORAL_TABLET | Freq: Four times a day (QID) | ORAL | 0 refills | Status: AC | PRN

## 2021-04-16 MED ORDER — PROMETHAZINE HCL 25 MG PO TABS: 25.0000 mg | ORAL_TABLET | Freq: Four times a day (QID) | ORAL | 0 refills | Status: DC | PRN

## 2021-04-16 MED ORDER — MORPHINE SULFATE (PF) 4 MG/ML IV SOLN
4.0000 mg | Freq: Once | INTRAVENOUS | Status: AC
Start: 2021-04-16 — End: 2021-04-16
  Administered 2021-04-16: 4 mg via INTRAMUSCULAR
  Filled 2021-04-16: qty 1

## 2021-04-16 NOTE — Discharge Instructions (Addendum)
Please follow-up with your general surgeon.  Please use Reglan instead of Zofran for nausea you may take 25 mg of Benadryl with this.  Drink plenty of water.  I given you a tablets of Percocet.  Please only use as needed.  You may take a total of 1000 mg of Tylenol every 6 hours the Percocet includes 325 mg of Tylenol.  Plenty water follow-up with your surgeon return to the ER for any new or concerning symptoms.

## 2021-04-16 NOTE — ED Triage Notes (Signed)
Per EMS, pt complains of abdominal pain radiating to back x 2 days. Hx of endometriosis. No vomiting, some nausea.   BP 110/78 HR 90 SpO2 99 Temp 97

## 2021-04-16 NOTE — ED Provider Notes (Signed)
Emergency Medicine Provider Triage Evaluation Note  Jane [name] , a 35 y.o. female  was evaluated in triage.  Pt complains of abdominal pain, onset yesterday, took percocet with some relief. 03/20/21 laparoscopy for bx, mass on cecum, endometriosis.  Admitted to [facility] for same 1 week after surgery with similar symptoms, questioned SBO. NG tube, NPO, obs, pain improved. Dc after 4 days, pain returned, readmitted and dc last Sunday.  Review of Systems  Positive: Abdominal pain, nausea Negative: Vomiting (has with previous episodes)  Physical Exam  BP (!) 130/92 (BP Location: Left Arm)   Pulse 79   Temp 99.2 F (37.3 C) (Oral)   Resp 17   LMP  (LMP Unknown)   SpO2 100%  Gen:   Awake, appears uncomfortable Resp:  Normal effort  MSK:   Moves extremities without difficulty  Other:    Medical Decision Making  Medically screening exam initiated at 11:47 AM.  Appropriate orders placed.  Jane [name] was informed that the remainder of the evaluation will be completed by another provider, this initial triage assessment does not replace that evaluation, and the importance of remaining in the ED until their evaluation is complete.     Jeannie Fend, PA-C 04/16/21 1149    Terrilee Files, MD 04/16/21 [id number]

## 2021-04-16 NOTE — ED Provider Notes (Signed)
[facility]-EMERGENCY DEPT Provider Note   CSN: [id number] Arrival date & time: 04/16/21  1121     History Chief Complaint  Patient presents with   Abdominal Pain    Jane [name] is a 35 y.o. female.  HPI Jane [name] is a 35 year old with a history of severe endometriosis presenting to the Emergency Department with pain and nausea s/p exploratory laparotomy 09/09 for cecal mass; pathology revealed mass was endometriosis.   She reports she has been having intractable pain since her laparotomy in September, that is cyclical, and when at it's worst feels like "knives stabbing from stomach to back". The pain does not radiate elsewhere. She has been admitted twice since the laparotomy for SBO treated with NG decompression, most recently being discharged 9/21. Since that discharge, she reports continued pain, but yesterday morning she was woken from sleep d/t severe abdominal pain. She took a Microbiologist with some relief. The pain returned again this morning, she took another percocet and did not have any relief so she came in to be evaluated. She also endorses having nausea which was relieved with Zofran and gas-x. At this time, she rates her pain at a 6/10 and is not nauseous. Her last bowel movement was 2 days ago, and reports she often goes 2-3 days without a bowel movement and then has 2+ in a day. She has not had a normal bowel movement since her surgery, reporting soft, narrow, low volume stools despite good appetite/food intake. She denies fever, sick contacts, chest pain, shortness of breath, cough, and vomiting. She has called her surgeon to discuss her post-op problems who can't see her until the end of the month.      Past Medical History:  Diagnosis Date   Anemia    childhood   Anxiety    Bladder infection    Endometriosis    GERD (gastroesophageal reflux disease)    Migraine    UTI (lower urinary tract infection)     Patient  Active Problem List   Diagnosis Date Noted   Anemia 12/18/2020   Intractable nausea and vomiting 07/27/2019   Hypocalcemia 07/27/2019   Dehydration 07/27/2019   Vitamin D deficiency 09/09/2014   Dental cavities 10/09/2013   Abdominal bloating 10/09/2013   Right shoulder pain 10/09/2013   Endometriosis of pelvic peritoneum 01/09/2011   Other and unspecified ovarian cyst 01/09/2011   Cocaine abuse, unspecified 01/09/2011    Past Surgical History:  Procedure Laterality Date   COLONOSCOPY     2020   ESOPHAGOGASTRODUODENOSCOPY ENDOSCOPY     LAPAROSCOPY N/A 03/20/2021   Procedure: DIAGNOSTIC LAPAROSCOPY AND BIOPSY;  Surgeon: Romie Levee, MD;  Location: [facility] ORS;  Service: General;  Laterality: N/A;   UMBILICAL HERNIA REPAIR       OB History   No obstetric history on file.     Family History  Problem Relation Age of Onset   Endometriosis Sister    Hypertension Maternal Grandmother    Stroke Maternal Grandmother    Cancer Maternal Grandmother    Cancer Paternal Grandmother    Stomach cancer Other    Rectal cancer Other    Esophageal cancer Other    Colon cancer Other     Social History   Tobacco Use   Smoking status: Never   Smokeless tobacco: Never  Vaping Use   Vaping Use: Never used  Substance Use Topics   Alcohol use: Yes    Comment: [employer].   Drug use: No  Home Medications Prior to Admission medications   Medication Sig Start Date End Date Taking? Authorizing Provider  oxyCODONE-acetaminophen (PERCOCET/ROXICET) 5-325 MG tablet Take 1 tablet by mouth every 6 (six) hours as needed for up to 3 days for severe pain. 04/16/21 04/19/21 Yes Adilenne Ashworth S, PA  promethazine (PHENERGAN) 25 MG tablet Take 1 tablet (25 mg total) by mouth every 6 (six) hours as needed for nausea or vomiting. 04/16/21  Yes Temiloluwa Laredo, Stevphen Meuse S, PA  Cholecalciferol (VITAMIN D) 125 MCG (5000 UT) CAPS Take 1 capsule by mouth daily. 07/29/19   Jerald Kief, MD  famotidine (PEPCID) 20 MG tablet  Take 1 tablet (20 mg total) by mouth 2 (two) times daily. 11/18/20   Tressia Danas, MD  medroxyPROGESTERone Acetate 150 MG/ML SUSY Inject 1 mL into the muscle every 3 (three) months. 07/30/20   [provider]  omeprazole (PRILOSEC) 40 MG capsule Take 1 capsule (40 mg total) by mouth daily. Patient taking differently: Take 1 capsule by mouth in the morning and at bedtime. 11/18/20   Tressia Danas, MD  sucralfate (CARAFATE) 1 g tablet Take 1 g by mouth 2 (two) times daily. 09/07/20   [provider]    Allergies    Toradol [ketorolac tromethamine], Clindamycin/lincomycin, and Vicodin [hydrocodone-acetaminophen]  Review of Systems   Review of Systems  Constitutional:  Negative for chills and fever.  HENT:  Negative for congestion.   Eyes:  Negative for pain.  Respiratory:  Negative for cough and shortness of breath.   Cardiovascular:  Negative for chest pain and leg swelling.  Gastrointestinal:  Positive for abdominal pain, constipation and nausea. Negative for vomiting.  Genitourinary:  Negative for dysuria.  Musculoskeletal:  Negative for myalgias.  Skin:  Negative for rash.  Neurological:  Negative for dizziness and headaches.   Physical Exam Updated Vital Signs BP 112/72   Pulse 70   Temp 98.3 F (36.8 C) (Oral)   Resp 15   Ht 5' 6.75" (1.695 m)   Wt 60.3 kg   LMP  (LMP Unknown)   SpO2 100%   BMI 20.99 kg/m   Physical Exam Vitals and nursing note reviewed.  Constitutional:      General: She is not in acute distress. HENT:     Head: Normocephalic and atraumatic.     Nose: Nose normal.  Eyes:     General: No scleral icterus. Cardiovascular:     Rate and Rhythm: Normal rate and regular rhythm.     Pulses: Normal pulses.     Heart sounds: Normal heart sounds.  Pulmonary:     Effort: Pulmonary effort is normal. No respiratory distress.     Breath sounds: No wheezing.  Abdominal:     Palpations: Abdomen is soft.     Tenderness: There is  abdominal tenderness.     Comments: Abdomen is soft diffusely tender with no focal abdominal tenderness no guarding or rebound. Bowel sounds present Small surgical incision present to abdomen from laparoscopy.  No redness or swelling no purulence.  Appears to be well-healing.  Musculoskeletal:     Cervical back: Normal range of motion.     Right lower leg: No edema.     Left lower leg: No edema.  Skin:    General: Skin is warm and dry.     Capillary Refill: Capillary refill takes less than 2 seconds.  Neurological:     Mental Status: She is alert. Mental status is at baseline.  Psychiatric:  Mood and Affect: Mood normal.        Behavior: Behavior normal.    ED Results / Procedures / Treatments   Labs (all labs ordered are listed, but only abnormal results are displayed) Labs Reviewed  COMPREHENSIVE METABOLIC PANEL - Abnormal; Notable for the following components:      Result Value   BUN 5 (*)    All other components within normal limits  LIPASE, BLOOD  CBC  URINALYSIS, ROUTINE W REFLEX MICROSCOPIC  I-STAT BETA HCG BLOOD, ED ([facility], [facility], [facility] ONLY)    EKG None  Radiology DG Abd Acute W/Chest  Result Date: 04/16/2021 CLINICAL DATA:  Abdominal pain radiating to the back for 2 days. EXAM: DG ABDOMEN ACUTE WITH 1 VIEW CHEST COMPARISON:  CT abdomen and pelvis 11/27/2020. Abdominal radiograph 07/28/2019. Chest radiographs 02/26/2009. FINDINGS: The cardiomediastinal silhouette is unchanged with normal heart size. The lungs are well inflated and clear. No pleural effusion or pneumothorax is identified. No intraperitoneal free air is identified. A moderate amount of stool is noted in the colon. No dilated loops of bowel are seen to suggest obstruction. No acute osseous abnormality is seen. IMPRESSION: No evidence of bowel obstruction or acute cardiopulmonary disease. Electronically Signed   By: Sebastian Ache M.D.   On: 04/16/2021 14:38    Procedures Procedures   Medications Ordered in  ED Medications  morphine 4 MG/ML injection 4 mg (4 mg Intramuscular Given 04/16/21 2321)    ED Course  I have reviewed the triage vital signs and the nursing notes.  Pertinent labs & imaging results that were available during my care of the patient were reviewed by me and considered in my medical decision making (see chart for details).    MDM Rules/Calculators/A&P                           Patient with history of endometriosis presenting to the ER today with continued pain and endometriosis.  Seem that she has intermittent issues with this.  Has been having more pain since her surgery 1 month ago.  Seems that she has endometriosis of the cecum was biopsied but not removed.  Abdomen is soft diffusely tender no guarding or rebound. She is well-appearing nontoxic.   I personally reviewed all laboratory work and imaging.  Metabolic panel without any acute abnormality specifically kidney function within normal limits and no significant electrolyte abnormalities. CBC without leukocytosis or significant anemia.   Associated with pregnancy.  Lipase is normal.  Abdomen chest does not show any evidence of obstruction I have low suspicion for this given her ability to pass gas and stool. Urinalysis without any evidence of infection.  She states that she is having bowel movements however even if they are somewhat thin at time and unusual.  She also is passing gas normally.  She states her pain is currently 4/10 on my reassessment.   We will provide her with Reglan instead of Zofran since she has had some issues with obstruction/SBO's in the past.  We will provide her handful of tablets of Percocet for pain reviewed EMR and she has not received any narcotics this past year apart from tramadol 1 month ago and states that this has not been very useful medication for her.  8 tablets total given.  Return precautions given.  She is tolerating p.o. here in the ER.  Ambulatory.  Seems to have minimal pain  at this time.  Repeat abdominal exam is unchanged.  She will need follow-up with her surgeon.  Final Clinical Impression(s) / ED Diagnoses Final diagnoses:  Generalized abdominal pain    Rx / DC Orders ED Discharge Orders          Ordered    promethazine (PHENERGAN) 25 MG tablet  Every 6 hours PRN        04/16/21 2255    oxyCODONE-acetaminophen (PERCOCET/ROXICET) 5-325 MG tablet  Every 6 hours PRN        04/16/21 2255             Gailen Shelter, [place] 04/16/21 2337    Vanetta Mulders, MD 04/21/21 1540

## 2021-05-05 ENCOUNTER — Ambulatory Visit: Payer: Self-pay | Admitting: General Surgery

## 2021-05-05 ENCOUNTER — Other Ambulatory Visit: Payer: Self-pay | Admitting: General Surgery

## 2021-05-05 DIAGNOSIS — N809 Endometriosis, unspecified: Secondary | ICD-10-CM | POA: Diagnosis not present

## 2021-05-05 NOTE — H&P (Signed)
Jane [name] is a 35 y.o. female who is seen today for f/u of RLQ pain.  CT shows a stable mass.  It appears she saw her gynecologist and was placed on Depo for this but reports no change in symptoms.  We decided to go ahead with diagnostic laparoscopy.  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  Biopsy showed inflammation only.  Since that time she has had trouble with pain and possible small bowel obstructions.     Past Medical History:  Diagnosis Date   Anemia    childhood   Anxiety    Bladder infection    Endometriosis    GERD (gastroesophageal reflux disease)    Migraine    UTI (lower urinary tract infection)    Past Surgical History:  Procedure Laterality Date   COLONOSCOPY     2020   ESOPHAGOGASTRODUODENOSCOPY ENDOSCOPY     LAPAROSCOPY N/A 03/20/2021   Procedure: DIAGNOSTIC LAPAROSCOPY AND BIOPSY;  Surgeon: Romie Levee, MD;  Location: [facility];  Service: General;  Laterality: N/A;   UMBILICAL HERNIA REPAIR     Social History   Socioeconomic History   Marital status: Legally Separated    Spouse name: Not on file   Number of children: Not on file   Years of education: Not on file   Highest education level: Not on file  Occupational History   Not on file  Tobacco Use   Smoking status: Never   Smokeless tobacco: Never  Vaping Use   Vaping Use: Never used  Substance and Sexual Activity   Alcohol use: Yes    Comment: [employer]c.   Drug use: No   Sexual activity: Yes    Birth control/protection: Injection  Other Topics Concern   Not on file  Social History Narrative   Not on file   Social Determinants of Health   Financial Resource Strain: Not on file  Food Insecurity: Not on file  Transportation Needs: Not on file  Physical Activity: Not on file  Stress: Not on file  Social Connections: Not on file  Intimate Partner Violence: Not on file   Family History  Problem Relation Age  of Onset   Endometriosis Sister    Hypertension Maternal Grandmother    Stroke Maternal Grandmother    Cancer Maternal Grandmother    Cancer Paternal Grandmother    Stomach cancer Other    Rectal cancer Other    Esophageal cancer Other    Colon cancer Other     Current Outpatient Medications:    Cholecalciferol (VITAMIN D) 125 MCG (5000 UT) CAPS, Take 1 capsule by mouth daily., Disp: 30 capsule, Rfl: 0   famotidine (PEPCID) 20 MG tablet, Take 1 tablet (20 mg total) by mouth 2 (two) times daily., Disp: 60 tablet, Rfl: 2   medroxyPROGESTERone Acetate 150 MG/ML SUSY, Inject 1 mL into the muscle every 3 (three) months., Disp: , Rfl:    omeprazole (PRILOSEC) 40 MG capsule, Take 1 capsule (40 mg total) by mouth daily. (Patient taking differently: Take 1 capsule by mouth in the morning and at bedtime.), Disp: 90 capsule, Rfl: 2   promethazine (PHENERGAN) 25 MG tablet, Take 1 tablet (25 mg total) by mouth every 6 (six) hours as needed for nausea or vomiting., Disp: 30 tablet, Rfl: 0   sucralfate (CARAFATE) 1 g tablet, Take 1 g by mouth 2 (two) times daily., Disp: , Rfl:   Current  Facility-Administered Medications:    0.9 %  sodium chloride infusion, 500 mL, Intravenous, Once, Tressia Danas, MD  Allergies  Allergen Reactions   Toradol [Ketorolac Tromethamine] Anxiety    suicidal   Clindamycin/Lincomycin Hives   Vicodin [Hydrocodone-Acetaminophen] Nausea And Vomiting   Review of Systems - Negative except in HPI    Physical Examination:    Gen: NAD CV: RRR Lung: CTA Abd: Soft, nondistended Incision: Healing appropriately   Assessment and Plan:    HONESTEE [name] is a 35 y.o. female who underwent diagnostic laparoscopy with biopsy.  She appears to have an endometrial lesion at her ileocecal region causing partial obstruction.  I recommended ileocecectomy.  Due to a cancellation in my schedule, I offered her a surgery for tomorrow but she was unable to accept this due to a previous  commitment in [place].  She would like to have surgery when she gets back.  We will do our best to accommodate these wishes. The surgery and anatomy were described to the patient as well as the risks of surgery and the possible complications.  These include: Bleeding, deep abdominal infections and possible wound complications such as hernia and infection, damage to adjacent structures, leak of surgical connections, which can lead to other surgeries and possibly an ostomy, possible need for other procedures, such as abscess drains in radiology, possible prolonged hospital stay, possible diarrhea from removal of part of the colon, possible constipation from narcotics, possible bowel, bladder or sexual dysfunction if having rectal surgery, prolonged fatigue/weakness or appetite loss, possible early recurrence of of disease, possible complications of their medical problems such as heart disease or arrhythmias or lung problems, death (less than 1%). I believe the patient understands and wishes to proceed with the surgery.   Diagnoses and all orders for this visit:   Endometriosis         No follow-ups on file.     The plan was discussed in detail with the patient today, who expressed understanding.  The patient has my contact information, and understands to call me with any additional questions or concerns in the interval.  I would be happy to see the patient back sooner if the need arises.

## 2021-05-19 ENCOUNTER — Ambulatory Visit: Payer: BC Managed Care – PPO | Admitting: Gastroenterology

## 2021-05-21 ENCOUNTER — Ambulatory Visit: Payer: BC Managed Care – PPO | Admitting: Gastroenterology

## 2021-06-17 ENCOUNTER — Other Ambulatory Visit (HOSPITAL_COMMUNITY): Payer: Self-pay

## 2021-06-19 NOTE — Patient Instructions (Addendum)
DUE TO COVID-19 ONLY ONE VISITOR IS ALLOWED TO COME WITH YOU AND STAY IN THE WAITING ROOM ONLY DURING PRE OP AND PROCEDURE DAY OF SURGERY IF YOU ARE GOING HOME AFTER SURGERY. IF YOU ARE SPENDING THE NIGHT 2 PEOPLE MAY VISIT WITH YOU IN YOUR PRIVATE ROOM AFTER SURGERY UNTIL VISITING  HOURS ARE OVER AT 800 PM AND 1  VISITOR  MAY  SPEND THE NIGHT.   YOU NEED TO HAVE A COVID 19 TEST ON_12/13____ THIS TEST MUST BE DONE BEFORE SURGERY,  COVID TESTING SITE  IS LOCATED AT [address], [place]. REMAIN IN YOUR CAR THIS IS A DRIVE UP TEST. AFTER YOUR COVID TEST PLEASE WEAR A MASK OUT IN PUBLIC AND SOCIAL DISTANCE AND WASH YOUR HANDS FREQUENTLY, ALSO ASK ALL YOUR CLOSE CONTACT PERSONS TO WEAR A MASK AND SOCIAL DISTANCE AND WASH THEIR HANDS FREQUENTLY ALSO.               Jane [name]     Your procedure is scheduled on: 06/25/21   Report to [facility] Main  Entrance   Report to admitting at   10:15 AM     Call this number if you have problems the morning of surgery [phone]    DRINK 2 PRESURGERY ENSURE DRINKS THE NIGHT BEFORE SURGERY AT 10:00 PM   NO SOLIDS AFTER MIDNIGHT THE DAY PRIOR TO THE SURGERY.   NOTHING BY MOUTH EXCEPT CLEAR LIQUIDS UNTIL    PLEASE FINISH PRESURGERY ENSURE DRINK PER SURGEON ORDER 3 HOURS PRIOR TO SCHEDULED SURGERY TIME WHICH NEEDS TO BE COMPLETED AT 9:30 am  CLEAR LIQUID DIET   Foods Allowed                                                                     Foods Excluded  Coffee and tea, regular and decaf                             liquids that you cannot  Plain Jell-O any favor except red or purple                                           see through such as: Fruit ices (not with fruit pulp)                                     milk, soups, orange juice  Iced Popsicles                                    All solid food Carbonated beverages, regular and diet                                    Cranberry, grape and apple  juices Sports drinks like Gatorade Lightly seasoned clear broth or consume(fat free) Sugar     BRUSH YOUR  TEETH MORNING OF SURGERY AND RINSE YOUR MOUTH OUT, NO CHEWING GUM CANDY OR MINTS.     Take these medicines the morning of surgery with A SIP OF WATER: Omeprazole                                You may not have any metal on your body including hair pins and              piercings  Do not wear jewelry, make-up, lotions, powders or perfumes, deodorant             Do not wear nail polish on your fingernails.  Do not shave  48 hours prior to surgery.                 Do not bring valuables to the hospital. [facility] IS NOT             RESPONSIBLE   FOR VALUABLES.  Contacts, dentures or bridgework may not be worn into surgery.  Leave suitcase in the car. After surgery it may be brought to your room.              [facility] - Preparing for Surgery Before surgery, you can play an important role.  Because skin is not sterile, your skin needs to be as free of germs as possible.  You can reduce the number of germs on your skin by washing with CHG (chlorahexidine gluconate) soap before surgery.  CHG is an antiseptic cleaner which kills germs and bonds with the skin to continue killing germs even after washing. Please DO NOT use if you have an allergy to CHG or antibacterial soaps.  If your skin becomes reddened/irritated stop using the CHG and inform your nurse when you arrive at Short Stay. Do not shave (including legs and underarms) for at least 48 hours prior to the first CHG shower.   Please follow these instructions carefully:  1.  Shower with CHG Soap the night before surgery and the  morning of Surgery.  2.  If you choose to wash your hair, wash your hair first as usual with your  normal  shampoo.  3.  After you shampoo, rinse your hair and body thoroughly to remove the  shampoo.                            4.  Use CHG as you would any other liquid soap.  You can apply chg directly   to the skin and wash                       Gently with a scrungie or clean washcloth.  5.  Apply the CHG Soap to your body ONLY FROM THE NECK DOWN.   Do not use on face/ open                           Wound or open sores. Avoid contact with eyes, ears mouth and genitals (private parts).                       Wash face,  Genitals (private parts) with your normal soap.             6.  Wash thoroughly, paying special attention to the area where your  surgery  will be performed.  7.  Thoroughly rinse your body with warm water from the neck down.  8.  DO NOT shower/wash with your normal soap after using and rinsing off  the CHG Soap.                9.  Pat yourself dry with a clean towel.            10.  Wear clean pajamas.            11.  Place clean sheets on your bed the night of your first shower and do not  sleep with pets. Day of Surgery : Do not apply any lotions/deodorants the morning of surgery.  Please wear clean clothes to the hospital/surgery center.  FAILURE TO FOLLOW THESE INSTRUCTIONS MAY RESULT IN THE CANCELLATION OF YOUR SURGERY PATIENT SIGNATURE_________________________________  NURSE SIGNATURE__________________________________  ________________________________________________________________________   Jane [name]  An incentive spirometer is a tool that can help keep your lungs clear and active. This tool measures how well you are filling your lungs with each breath. Taking long deep breaths may help reverse or decrease the chance of developing breathing (pulmonary) problems (especially infection) following: A long period of time when you are unable to move or be active. BEFORE THE PROCEDURE  If the spirometer includes an indicator to show your best effort, your nurse or respiratory therapist will set it to a desired goal. If possible, sit up straight or lean slightly forward. Try not to slouch. Hold the incentive spirometer in an upright position. INSTRUCTIONS FOR  USE  Sit on the edge of your bed if possible, or sit up as far as you can in bed or on a chair. Hold the incentive spirometer in an upright position. Breathe out normally. Place the mouthpiece in your mouth and seal your lips tightly around it. Breathe in slowly and as deeply as possible, raising the piston or the ball toward the top of the column. Hold your breath for 3-5 seconds or for as long as possible. Allow the piston or ball to fall to the bottom of the column. Remove the mouthpiece from your mouth and breathe out normally. Rest for a few seconds and repeat Steps 1 through 7 at least 10 times every 1-2 hours when you are awake. Take your time and take a few normal breaths between deep breaths. The spirometer may include an indicator to show your best effort. Use the indicator as a goal to work toward during each repetition. After each set of 10 deep breaths, practice coughing to be sure your lungs are clear. If you have an incision (the cut made at the time of surgery), support your incision when coughing by placing a pillow or rolled up towels firmly against it. Once you are able to get out of bed, walk around indoors and cough well. You may stop using the incentive spirometer when instructed by your caregiver.  RISKS AND COMPLICATIONS Take your time so you do not get dizzy or light-headed. If you are in pain, you may need to take or ask for pain medication before doing incentive spirometry. It is harder to take a deep breath if you are having pain. AFTER USE Rest and breathe slowly and easily. It can be helpful to keep track of a log of your progress. Your caregiver can provide you with a simple table to help with this. If you are using the spirometer at home, follow these instructions: SEEK MEDICAL CARE IF:  You are having difficultly using the spirometer. You have trouble using the spirometer as often as instructed. Your pain medication is not giving enough relief while using the  spirometer. You develop fever of 100.5 F (38.1 C) or higher. SEEK IMMEDIATE MEDICAL CARE IF:  You cough up bloody sputum that had not been present before. You develop fever of 102 F (38.9 C) or greater. You develop worsening pain at or near the incision site. MAKE SURE YOU:  Understand these instructions. Will watch your condition. Will get help right away if you are not doing well or get worse. Document Released: 11/08/2006 Document Revised: 09/20/2011 Document Reviewed: 01/09/2007 [facility] Patient Information 2014 [place], [place].   ________________________________________________________________________

## 2021-06-22 ENCOUNTER — Encounter (HOSPITAL_COMMUNITY)
Admission: RE | Admit: 2021-06-22 | Discharge: 2021-06-22 | Disposition: A | Discharge status: Home / Self Care | Payer: BC Managed Care – PPO | Source: Ambulatory Visit | Attending: General Surgery | Admitting: General Surgery

## 2021-06-22 ENCOUNTER — Encounter (HOSPITAL_COMMUNITY): Payer: Self-pay

## 2021-06-22 ENCOUNTER — Other Ambulatory Visit: Payer: Self-pay

## 2021-06-22 VITALS — BP 120/67 | HR 78 | Temp 98.6°F | Resp 18 | Ht 66.75 in | Wt 134.0 lb

## 2021-06-22 DIAGNOSIS — N809 Endometriosis, unspecified: Secondary | ICD-10-CM | POA: Diagnosis not present

## 2021-06-22 DIAGNOSIS — F419 Anxiety disorder, unspecified: Secondary | ICD-10-CM | POA: Diagnosis not present

## 2021-06-22 DIAGNOSIS — N805 Endometriosis of intestine, unspecified: Secondary | ICD-10-CM | POA: Diagnosis not present

## 2021-06-22 DIAGNOSIS — D649 Anemia, unspecified: Secondary | ICD-10-CM | POA: Diagnosis not present

## 2021-06-22 DIAGNOSIS — Z8249 Family history of ischemic heart disease and other diseases of the circulatory system: Secondary | ICD-10-CM | POA: Diagnosis not present

## 2021-06-22 DIAGNOSIS — N80539 Endometriosis of the cecum, unspecified depth: Secondary | ICD-10-CM | POA: Diagnosis not present

## 2021-06-22 DIAGNOSIS — K219 Gastro-esophageal reflux disease without esophagitis: Secondary | ICD-10-CM | POA: Diagnosis not present

## 2021-06-22 DIAGNOSIS — N803 Endometriosis of pelvic peritoneum, unspecified: Secondary | ICD-10-CM | POA: Diagnosis not present

## 2021-06-22 DIAGNOSIS — Z01818 Encounter for other preprocedural examination: Secondary | ICD-10-CM

## 2021-06-22 DIAGNOSIS — Z800 Family history of malignant neoplasm of digestive organs: Secondary | ICD-10-CM | POA: Diagnosis not present

## 2021-06-22 DIAGNOSIS — E559 Vitamin D deficiency, unspecified: Secondary | ICD-10-CM | POA: Diagnosis not present

## 2021-06-22 DIAGNOSIS — K567 Ileus, unspecified: Secondary | ICD-10-CM | POA: Diagnosis not present

## 2021-06-22 DIAGNOSIS — Z01812 Encounter for preprocedural laboratory examination: Secondary | ICD-10-CM | POA: Insufficient documentation

## 2021-06-22 DIAGNOSIS — N80549 Endometriosis of the appendix, unspecified depth: Secondary | ICD-10-CM | POA: Diagnosis not present

## 2021-06-22 DIAGNOSIS — N80569 Endometriosis of the small intestine, unspecified depth: Secondary | ICD-10-CM | POA: Diagnosis not present

## 2021-06-22 DIAGNOSIS — Z20822 Contact with and (suspected) exposure to covid-19: Secondary | ICD-10-CM | POA: Diagnosis not present

## 2021-06-22 LAB — CBC
HCT: 38.5 % (ref 36.0–46.0)
Hemoglobin: 12 g/dL (ref 12.0–15.0)
MCH: 28.8 pg (ref 26.0–34.0)
MCHC: 31.2 g/dL (ref 30.0–36.0)
MCV: 92.5 fL (ref 80.0–100.0)
Platelets: 218 10*3/uL (ref 150–400)
RBC: 4.16 MIL/uL (ref 3.87–5.11)
RDW: 13.2 % (ref 11.5–15.5)
WBC: 4.6 10*3/uL (ref 4.0–10.5)
nRBC: 0 % (ref 0.0–0.2)

## 2021-06-22 NOTE — Progress Notes (Signed)
COVID test-12/13   PCP - Dr. Clarisa Schools Cardiologist - none  Chest x-ray - no EKG - no Stress Test - no ECHO - no Cardiac Cath - no Pacemaker/ICD device last checked:NA  Sleep Study - no CPAP -   Fasting Blood Sugar -NA  Checks Blood Sugar _____ times a day  Blood Thinner Instructions:NA Aspirin Instructions: Last Dose:  Anesthesia review: no  Patient denies shortness of breath, fever, cough and chest pain at PAT appointment Pt has no SOB with any activities.  Patient verbalized understanding of instructions that were given to them at the PAT appointment. Patient was also instructed that they will need to review over the PAT instructions again at home before surgery. Yes. Pt was told to call Dr. Maisie Fus to ask about the bowel prep.

## 2021-06-23 ENCOUNTER — Other Ambulatory Visit: Payer: Self-pay | Admitting: General Surgery

## 2021-06-24 LAB — SARS CORONAVIRUS 2 (TAT 6-24 HRS): SARS Coronavirus 2: NEGATIVE

## 2021-06-25 ENCOUNTER — Other Ambulatory Visit: Payer: Self-pay

## 2021-06-25 ENCOUNTER — Inpatient Hospital Stay (HOSPITAL_COMMUNITY): Payer: BC Managed Care – PPO | Admitting: Anesthesiology

## 2021-06-25 ENCOUNTER — Encounter (HOSPITAL_COMMUNITY)
Admission: RE | Disposition: A | Discharge status: Home / Self Care | Payer: Self-pay | Source: Home / Self Care | Attending: General Surgery

## 2021-06-25 ENCOUNTER — Inpatient Hospital Stay (HOSPITAL_COMMUNITY)
Admission: RE | Admit: 2021-06-25 | Discharge: 2021-07-02 | DRG: 982 | Disposition: A | Discharge status: Home / Self Care | Payer: BC Managed Care – PPO | Attending: General Surgery | Admitting: General Surgery

## 2021-06-25 ENCOUNTER — Encounter (HOSPITAL_COMMUNITY): Payer: Self-pay | Admitting: General Surgery

## 2021-06-25 DIAGNOSIS — K219 Gastro-esophageal reflux disease without esophagitis: Secondary | ICD-10-CM | POA: Diagnosis present

## 2021-06-25 DIAGNOSIS — F419 Anxiety disorder, unspecified: Secondary | ICD-10-CM | POA: Diagnosis present

## 2021-06-25 DIAGNOSIS — N809 Endometriosis, unspecified: Secondary | ICD-10-CM | POA: Diagnosis present

## 2021-06-25 DIAGNOSIS — E559 Vitamin D deficiency, unspecified: Secondary | ICD-10-CM | POA: Diagnosis present

## 2021-06-25 DIAGNOSIS — Z800 Family history of malignant neoplasm of digestive organs: Secondary | ICD-10-CM | POA: Diagnosis not present

## 2021-06-25 DIAGNOSIS — K567 Ileus, unspecified: Secondary | ICD-10-CM | POA: Diagnosis not present

## 2021-06-25 DIAGNOSIS — Z01818 Encounter for other preprocedural examination: Secondary | ICD-10-CM

## 2021-06-25 DIAGNOSIS — Z20822 Contact with and (suspected) exposure to covid-19: Secondary | ICD-10-CM | POA: Diagnosis present

## 2021-06-25 DIAGNOSIS — Z8249 Family history of ischemic heart disease and other diseases of the circulatory system: Secondary | ICD-10-CM

## 2021-06-25 HISTORY — PX: LAPAROSCOPIC ILEOCECECTOMY: SHX5898

## 2021-06-25 LAB — PREGNANCY, URINE: Preg Test, Ur: NEGATIVE

## 2021-06-25 SURGERY — EXCISION, CECUM WITH ILEUM, LAPAROSCOPIC
Anesthesia: General

## 2021-06-25 MED ORDER — GABAPENTIN 300 MG PO CAPS: 300.0000 mg | ORAL_CAPSULE | ORAL | Status: AC

## 2021-06-25 MED ORDER — 0.9 % SODIUM CHLORIDE (POUR BTL) OPTIME
TOPICAL | Status: DC | PRN
  Administered 2021-06-25: 1500 mL

## 2021-06-25 MED ORDER — ALVIMOPAN 12 MG PO CAPS
12.0000 mg | ORAL_CAPSULE | Freq: Two times a day (BID) | ORAL | Status: DC
  Administered 2021-06-26 – 2021-06-30 (×7): 12 mg via ORAL
  Filled 2021-06-25 (×9): qty 1

## 2021-06-25 MED ORDER — SUGAMMADEX SODIUM 200 MG/2ML IV SOLN
INTRAVENOUS | Status: DC | PRN
  Administered 2021-06-25: 200 mg via INTRAVENOUS

## 2021-06-25 MED ORDER — FENTANYL CITRATE PF 50 MCG/ML IJ SOSY
PREFILLED_SYRINGE | INTRAMUSCULAR | Status: AC
  Filled 2021-06-25: qty 1

## 2021-06-25 MED ORDER — FENTANYL CITRATE (PF) 100 MCG/2ML IJ SOLN
INTRAMUSCULAR | Status: AC
  Filled 2021-06-25: qty 2

## 2021-06-25 MED ORDER — BUPIVACAINE LIPOSOME 1.3 % IJ SUSP: 20.0000 mL | Freq: Once | INTRAMUSCULAR | Status: DC

## 2021-06-25 MED ORDER — KETAMINE HCL-SODIUM CHLORIDE 100-0.9 MG/10ML-% IV SOSY
PREFILLED_SYRINGE | INTRAVENOUS | Status: DC | PRN
  Administered 2021-06-25: 20 mg via INTRAVENOUS
  Administered 2021-06-25: 10 mg via INTRAVENOUS

## 2021-06-25 MED ORDER — ONDANSETRON HCL 4 MG/2ML IJ SOLN
INTRAMUSCULAR | Status: DC | PRN
  Administered 2021-06-25: 4 mg via INTRAVENOUS

## 2021-06-25 MED ORDER — FENTANYL CITRATE (PF) 250 MCG/5ML IJ SOLN
INTRAMUSCULAR | Status: AC
  Filled 2021-06-25: qty 5

## 2021-06-25 MED ORDER — SIMETHICONE 80 MG PO CHEW
40.0000 mg | CHEWABLE_TABLET | Freq: Four times a day (QID) | ORAL | Status: DC | PRN
  Administered 2021-06-25 – 2021-07-02 (×3): 40 mg via ORAL
  Filled 2021-06-25 (×5): qty 1

## 2021-06-25 MED ORDER — PANTOPRAZOLE SODIUM 40 MG PO TBEC
80.0000 mg | DELAYED_RELEASE_TABLET | Freq: Every day | ORAL | Status: DC
  Administered 2021-06-25 – 2021-07-01 (×6): 80 mg via ORAL
  Filled 2021-06-25 (×7): qty 2

## 2021-06-25 MED ORDER — KCL IN DEXTROSE-NACL 20-5-0.45 MEQ/L-%-% IV SOLN
INTRAVENOUS | Status: DC
  Filled 2021-06-25 (×3): qty 1000

## 2021-06-25 MED ORDER — FAMOTIDINE 20 MG PO TABS
20.0000 mg | ORAL_TABLET | Freq: Two times a day (BID) | ORAL | Status: DC
  Administered 2021-06-25 – 2021-07-01 (×12): 20 mg via ORAL
  Filled 2021-06-25 (×13): qty 1

## 2021-06-25 MED ORDER — MIDAZOLAM HCL 2 MG/2ML IJ SOLN
INTRAMUSCULAR | Status: AC
  Filled 2021-06-25: qty 2

## 2021-06-25 MED ORDER — BUPIVACAINE-EPINEPHRINE 0.25% -1:200000 IJ SOLN
INTRAMUSCULAR | Status: DC | PRN
  Administered 2021-06-25: 30 mL

## 2021-06-25 MED ORDER — CHLORHEXIDINE GLUCONATE 0.12 % MT SOLN
15.0000 mL | Freq: Once | OROMUCOSAL | Status: AC
  Administered 2021-06-25: 15 mL via OROMUCOSAL

## 2021-06-25 MED ORDER — BUPIVACAINE LIPOSOME 1.3 % IJ SUSP
INTRAMUSCULAR | Status: DC | PRN
  Administered 2021-06-25: 20 mL

## 2021-06-25 MED ORDER — ENSURE PRE-SURGERY PO LIQD: 592.0000 mL | Freq: Once | ORAL | Status: DC

## 2021-06-25 MED ORDER — DEXAMETHASONE SODIUM PHOSPHATE 10 MG/ML IJ SOLN
INTRAMUSCULAR | Status: DC | PRN
  Administered 2021-06-25: 10 mg via INTRAVENOUS

## 2021-06-25 MED ORDER — ENSURE PRE-SURGERY PO LIQD: 296.0000 mL | Freq: Once | ORAL | Status: DC

## 2021-06-25 MED ORDER — HYDROMORPHONE HCL 1 MG/ML IJ SOLN
INTRAMUSCULAR | Status: DC | PRN
  Administered 2021-06-25: .5 mg via INTRAVENOUS
  Administered 2021-06-25: 1 mg via INTRAVENOUS

## 2021-06-25 MED ORDER — DIPHENHYDRAMINE HCL 50 MG/ML IJ SOLN: 25.0000 mg | Freq: Four times a day (QID) | INTRAMUSCULAR | Status: DC | PRN

## 2021-06-25 MED ORDER — ROCURONIUM BROMIDE 10 MG/ML (PF) SYRINGE
PREFILLED_SYRINGE | INTRAVENOUS | Status: DC | PRN
  Administered 2021-06-25: 100 mg via INTRAVENOUS

## 2021-06-25 MED ORDER — SODIUM CHLORIDE 0.9 % IV SOLN
2.0000 g | INTRAVENOUS | Status: AC
  Administered 2021-06-25: 2 g via INTRAVENOUS

## 2021-06-25 MED ORDER — SODIUM CHLORIDE 0.9 % IV SOLN
INTRAVENOUS | Status: AC
  Filled 2021-06-25: qty 2

## 2021-06-25 MED ORDER — ONDANSETRON HCL 4 MG/2ML IJ SOLN
4.0000 mg | Freq: Four times a day (QID) | INTRAMUSCULAR | Status: DC | PRN
  Administered 2021-06-25: 4 mg via INTRAVENOUS
  Filled 2021-06-25: qty 2

## 2021-06-25 MED ORDER — FENTANYL CITRATE PF 50 MCG/ML IJ SOSY
25.0000 ug | PREFILLED_SYRINGE | INTRAMUSCULAR | Status: DC | PRN
  Administered 2021-06-25 (×2): 50 ug via INTRAVENOUS

## 2021-06-25 MED ORDER — MIDAZOLAM HCL 5 MG/5ML IJ SOLN
INTRAMUSCULAR | Status: DC | PRN
  Administered 2021-06-25: 2 mg via INTRAVENOUS

## 2021-06-25 MED ORDER — KETAMINE HCL-SODIUM CHLORIDE 100-0.9 MG/10ML-% IV SOSY
PREFILLED_SYRINGE | INTRAVENOUS | Status: AC
  Filled 2021-06-25: qty 10

## 2021-06-25 MED ORDER — ACETAMINOPHEN 500 MG PO TABS
ORAL_TABLET | ORAL | Status: AC
  Administered 2021-06-25: 1000 mg via ORAL
  Filled 2021-06-25: qty 2

## 2021-06-25 MED ORDER — ALVIMOPAN 12 MG PO CAPS
ORAL_CAPSULE | ORAL | Status: AC
  Administered 2021-06-25: 12 mg via ORAL
  Filled 2021-06-25: qty 1

## 2021-06-25 MED ORDER — SACCHAROMYCES BOULARDII 250 MG PO CAPS
250.0000 mg | ORAL_CAPSULE | Freq: Two times a day (BID) | ORAL | Status: DC
  Administered 2021-06-25 – 2021-07-01 (×9): 250 mg via ORAL
  Filled 2021-06-25 (×13): qty 1

## 2021-06-25 MED ORDER — LIDOCAINE HCL (PF) 2 % IJ SOLN
INTRAMUSCULAR | Status: DC | PRN
  Administered 2021-06-25: 1.5 mg/kg/h via INTRADERMAL

## 2021-06-25 MED ORDER — HYDROMORPHONE HCL 1 MG/ML IJ SOLN
0.5000 mg | INTRAMUSCULAR | Status: DC | PRN
  Administered 2021-06-25 – 2021-06-28 (×20): 0.5 mg via INTRAVENOUS
  Filled 2021-06-25 (×20): qty 0.5

## 2021-06-25 MED ORDER — ALUM & MAG HYDROXIDE-SIMETH 200-200-20 MG/5ML PO SUSP
30.0000 mL | Freq: Four times a day (QID) | ORAL | Status: DC | PRN
  Administered 2021-06-25 – 2021-07-02 (×5): 30 mL via ORAL
  Filled 2021-06-25 (×6): qty 30

## 2021-06-25 MED ORDER — FENTANYL CITRATE (PF) 250 MCG/5ML IJ SOLN
INTRAMUSCULAR | Status: DC | PRN
  Administered 2021-06-25 (×3): 50 ug via INTRAVENOUS
  Administered 2021-06-25: 100 ug via INTRAVENOUS
  Administered 2021-06-25 (×2): 50 ug via INTRAVENOUS

## 2021-06-25 MED ORDER — ALVIMOPAN 12 MG PO CAPS: 12.0000 mg | ORAL_CAPSULE | ORAL | Status: AC

## 2021-06-25 MED ORDER — ACETAMINOPHEN 325 MG PO TABS: 325.0000 mg | ORAL_TABLET | ORAL | Status: DC | PRN

## 2021-06-25 MED ORDER — PROPOFOL 10 MG/ML IV BOLUS
INTRAVENOUS | Status: DC | PRN
  Administered 2021-06-25: 140 mg via INTRAVENOUS
  Administered 2021-06-25: 50 mg via INTRAVENOUS

## 2021-06-25 MED ORDER — LACTATED RINGERS IR SOLN
Status: DC | PRN
  Administered 2021-06-25: 1000 mL

## 2021-06-25 MED ORDER — BUPIVACAINE LIPOSOME 1.3 % IJ SUSP
INTRAMUSCULAR | Status: AC
  Filled 2021-06-25: qty 20

## 2021-06-25 MED ORDER — BUPIVACAINE-EPINEPHRINE (PF) 0.25% -1:200000 IJ SOLN
INTRAMUSCULAR | Status: AC
  Filled 2021-06-25: qty 30

## 2021-06-25 MED ORDER — OXYCODONE HCL 5 MG/5ML PO SOLN: 5.0000 mg | Freq: Once | ORAL | Status: DC | PRN

## 2021-06-25 MED ORDER — ENOXAPARIN SODIUM 40 MG/0.4ML IJ SOSY
40.0000 mg | PREFILLED_SYRINGE | INTRAMUSCULAR | Status: DC
  Administered 2021-06-26 – 2021-06-29 (×4): 40 mg via SUBCUTANEOUS
  Filled 2021-06-25 (×6): qty 0.4

## 2021-06-25 MED ORDER — DIPHENHYDRAMINE HCL 25 MG PO CAPS: 25.0000 mg | ORAL_CAPSULE | Freq: Four times a day (QID) | ORAL | Status: DC | PRN

## 2021-06-25 MED ORDER — ONDANSETRON HCL 4 MG/2ML IJ SOLN: 4.0000 mg | Freq: Once | INTRAMUSCULAR | Status: DC | PRN

## 2021-06-25 MED ORDER — ACETAMINOPHEN 500 MG PO TABS: 1000.0000 mg | ORAL_TABLET | ORAL | Status: AC

## 2021-06-25 MED ORDER — TRAMADOL HCL 50 MG PO TABS
50.0000 mg | ORAL_TABLET | Freq: Four times a day (QID) | ORAL | Status: DC | PRN
  Administered 2021-06-26: 100 mg via ORAL
  Filled 2021-06-25 (×3): qty 2

## 2021-06-25 MED ORDER — OXYCODONE HCL 5 MG PO TABS: 5.0000 mg | ORAL_TABLET | Freq: Once | ORAL | Status: DC | PRN

## 2021-06-25 MED ORDER — ONDANSETRON HCL 4 MG/2ML IJ SOLN
INTRAMUSCULAR | Status: AC
  Filled 2021-06-25: qty 2

## 2021-06-25 MED ORDER — GABAPENTIN 300 MG PO CAPS
ORAL_CAPSULE | ORAL | Status: AC
  Administered 2021-06-25: 300 mg via ORAL
  Filled 2021-06-25: qty 1

## 2021-06-25 MED ORDER — ONDANSETRON HCL 4 MG PO TABS
4.0000 mg | ORAL_TABLET | Freq: Four times a day (QID) | ORAL | Status: DC | PRN
  Administered 2021-06-28: 07:00:00 4 mg via ORAL
  Filled 2021-06-25 (×2): qty 1

## 2021-06-25 MED ORDER — LIDOCAINE 2% (20 MG/ML) 5 ML SYRINGE
INTRAMUSCULAR | Status: DC | PRN
  Administered 2021-06-25: 60 mg via INTRAVENOUS

## 2021-06-25 MED ORDER — MEPERIDINE HCL 50 MG/ML IJ SOLN
6.2500 mg | INTRAMUSCULAR | Status: DC | PRN
Start: 2021-06-25 — End: 2021-06-25

## 2021-06-25 MED ORDER — LACTATED RINGERS IV SOLN: INTRAVENOUS | Status: DC

## 2021-06-25 MED ORDER — ORAL CARE MOUTH RINSE: 15.0000 mL | Freq: Once | OROMUCOSAL | Status: AC

## 2021-06-25 MED ORDER — HYDROMORPHONE HCL 2 MG/ML IJ SOLN
INTRAMUSCULAR | Status: AC
  Filled 2021-06-25: qty 1

## 2021-06-25 MED ORDER — ACETAMINOPHEN 160 MG/5ML PO SOLN: 325.0000 mg | ORAL | Status: DC | PRN

## 2021-06-25 MED ORDER — GABAPENTIN 300 MG PO CAPS
300.0000 mg | ORAL_CAPSULE | Freq: Two times a day (BID) | ORAL | Status: DC
  Administered 2021-06-25 – 2021-06-30 (×8): 300 mg via ORAL
  Filled 2021-06-25 (×13): qty 1

## 2021-06-25 MED ORDER — ENSURE SURGERY PO LIQD
237.0000 mL | Freq: Two times a day (BID) | ORAL | Status: DC
  Administered 2021-06-26 – 2021-06-30 (×5): 237 mL via ORAL

## 2021-06-25 SURGICAL SUPPLY — 101 items
ADH SKN CLS APL DERMABOND .7 (GAUZE/BANDAGES/DRESSINGS) ×1
BAG COUNTER SPONGE SURGICOUNT (BAG) ×2 IMPLANT
BAG SPNG CNTER NS LX DISP (BAG) ×1
BAG SURGICOUNT SPONGE COUNTING (BAG) ×1
BLADE EXTENDED COATED 6.5IN (ELECTRODE) IMPLANT
CANNULA REDUC XI 12-8 STAPL (CANNULA)
CANNULA REDUC XI 12-8MM STAPL (CANNULA)
CANNULA REDUCER 12-8 DVNC XI (CANNULA) IMPLANT
CELLS DAT CNTRL 66122 CELL SVR (MISCELLANEOUS) ×1 IMPLANT
COVER SURGICAL LIGHT HANDLE (MISCELLANEOUS) ×6 IMPLANT
COVER TIP SHEARS 8 DVNC (MISCELLANEOUS) ×1 IMPLANT
COVER TIP SHEARS 8MM DA VINCI (MISCELLANEOUS) ×3
DECANTER SPIKE VIAL GLASS SM (MISCELLANEOUS) IMPLANT
DERMABOND ADVANCED (GAUZE/BANDAGES/DRESSINGS) ×2
DERMABOND ADVANCED .7 DNX12 (GAUZE/BANDAGES/DRESSINGS) IMPLANT
DRAIN CHANNEL 19F RND (DRAIN) IMPLANT
DRAPE ARM DVNC X/XI (DISPOSABLE) ×4 IMPLANT
DRAPE COLUMN DVNC XI (DISPOSABLE) ×1 IMPLANT
DRAPE DA VINCI XI ARM (DISPOSABLE) ×12
DRAPE DA VINCI XI COLUMN (DISPOSABLE) ×3
DRAPE SURG IRRIG POUCH 19X23 (DRAPES) ×3 IMPLANT
DRSG OPSITE POSTOP 4X10 (GAUZE/BANDAGES/DRESSINGS) IMPLANT
DRSG OPSITE POSTOP 4X6 (GAUZE/BANDAGES/DRESSINGS) ×2 IMPLANT
DRSG OPSITE POSTOP 4X8 (GAUZE/BANDAGES/DRESSINGS) IMPLANT
ELECT PENCIL ROCKER SW 15FT (MISCELLANEOUS) ×3 IMPLANT
ELECT REM PT RETURN 15FT ADLT (MISCELLANEOUS) ×3 IMPLANT
ENDOLOOP SUT PDS II  0 18 (SUTURE)
ENDOLOOP SUT PDS II 0 18 (SUTURE) IMPLANT
EVACUATOR SILICONE 100CC (DRAIN) IMPLANT
GLOVE SURG ENC MOIS LTX SZ6.5 (GLOVE) ×9 IMPLANT
GLOVE SURG UNDER LTX SZ6.5 (GLOVE) ×3 IMPLANT
GLOVE SURG UNDER POLY LF SZ7 (GLOVE) ×6 IMPLANT
GOWN STRL REUS W/TWL XL LVL3 (GOWN DISPOSABLE) ×9 IMPLANT
GRASPER SUT TROCAR 14GX15 (MISCELLANEOUS) IMPLANT
HOLDER FOLEY CATH W/STRAP (MISCELLANEOUS) ×3 IMPLANT
IRRIG SUCT STRYKERFLOW 2 WTIP (MISCELLANEOUS) ×3
IRRIGATION SUCT STRKRFLW 2 WTP (MISCELLANEOUS) ×1 IMPLANT
KIT PROCEDURE DA VINCI SI (MISCELLANEOUS)
KIT PROCEDURE DVNC SI (MISCELLANEOUS) IMPLANT
KIT TURNOVER KIT A (KITS) ×2 IMPLANT
NDL INSUFFLATION 14GA 120MM (NEEDLE) ×1 IMPLANT
NEEDLE INSUFFLATION 14GA 120MM (NEEDLE) ×3 IMPLANT
PACK CARDIOVASCULAR III (CUSTOM PROCEDURE TRAY) ×3 IMPLANT
PACK COLON (CUSTOM PROCEDURE TRAY) ×3 IMPLANT
PAD POSITIONING PINK XL (MISCELLANEOUS) ×3 IMPLANT
RELOAD STAPLE 60 2.5 WHT DVNC (STAPLE) IMPLANT
RELOAD STAPLE 60 3.5 BLU DVNC (STAPLE) IMPLANT
RELOAD STAPLE 60 4.3 GRN DVNC (STAPLE) IMPLANT
RELOAD STAPLER 2.5X60 WHT DVNC (STAPLE) ×1 IMPLANT
RELOAD STAPLER 3.5X60 BLU DVNC (STAPLE) ×1 IMPLANT
RELOAD STAPLER 4.3X60 GRN DVNC (STAPLE) ×2 IMPLANT
RETRACTOR WND ALEXIS 18 MED (MISCELLANEOUS) IMPLANT
RTRCTR WOUND ALEXIS 18CM MED (MISCELLANEOUS) ×3
SCISSORS LAP 5X35 DISP (ENDOMECHANICALS) IMPLANT
SEAL CANN UNIV 5-8 DVNC XI (MISCELLANEOUS) ×3 IMPLANT
SEAL XI 5MM-8MM UNIVERSAL (MISCELLANEOUS) ×9
SEALER VESSEL DA VINCI XI (MISCELLANEOUS) ×3
SEALER VESSEL EXT DVNC XI (MISCELLANEOUS) ×1 IMPLANT
SOLUTION ELECTROLUBE (MISCELLANEOUS) ×3 IMPLANT
STAPLER 60 DA VINCI SURE FORM (STAPLE) ×3
STAPLER 60 SUREFORM DVNC (STAPLE) IMPLANT
STAPLER CANNULA SEAL DVNC XI (STAPLE) IMPLANT
STAPLER CANNULA SEAL XI (STAPLE)
STAPLER ECHELON POWER CIR 29 (STAPLE) IMPLANT
STAPLER ECHELON POWER CIR 31 (STAPLE) IMPLANT
STAPLER RELOAD 2.5X60 WHITE (STAPLE) ×3
STAPLER RELOAD 2.5X60 WHT DVNC (STAPLE) ×1
STAPLER RELOAD 3.5X60 BLU DVNC (STAPLE) ×1
STAPLER RELOAD 3.5X60 BLUE (STAPLE) ×3
STAPLER RELOAD 4.3X60 GREEN (STAPLE) ×6
STAPLER RELOAD 4.3X60 GRN DVNC (STAPLE) ×2
STOPCOCK 4 WAY LG BORE MALE ST (IV SETS) ×6 IMPLANT
SUT ETHILON 2 0 PS N (SUTURE) IMPLANT
SUT NOVA NAB DX-16 0-1 5-0 T12 (SUTURE) ×6 IMPLANT
SUT PROLENE 2 0 KS (SUTURE) IMPLANT
SUT SILK 2 0 (SUTURE) ×3
SUT SILK 2 0 SH CR/8 (SUTURE) IMPLANT
SUT SILK 2-0 18XBRD TIE 12 (SUTURE) ×1 IMPLANT
SUT SILK 3 0 (SUTURE)
SUT SILK 3 0 SH CR/8 (SUTURE) ×3 IMPLANT
SUT SILK 3-0 18XBRD TIE 12 (SUTURE) IMPLANT
SUT V-LOC BARB 180 2/0GR6 GS22 (SUTURE) ×3
SUT VIC AB 2-0 SH 18 (SUTURE) IMPLANT
SUT VIC AB 2-0 SH 27 (SUTURE)
SUT VIC AB 2-0 SH 27X BRD (SUTURE) IMPLANT
SUT VIC AB 3-0 SH 18 (SUTURE) IMPLANT
SUT VIC AB 4-0 PS2 27 (SUTURE) ×6 IMPLANT
SUT VICRYL 0 UR6 27IN ABS (SUTURE) ×3 IMPLANT
SUTURE V-LC BRB 180 2/0GR6GS22 (SUTURE) IMPLANT
SYR 10ML ECCENTRIC (SYRINGE) ×3 IMPLANT
SYS LAPSCP GELPORT 120MM (MISCELLANEOUS)
SYS WOUND ALEXIS 18CM MED (MISCELLANEOUS)
SYSTEM LAPSCP GELPORT 120MM (MISCELLANEOUS) IMPLANT
SYSTEM WOUND ALEXIS 18CM MED (MISCELLANEOUS) IMPLANT
TOWEL OR 17X26 10 PK STRL BLUE (TOWEL DISPOSABLE) IMPLANT
TOWEL OR NON WOVEN STRL DISP B (DISPOSABLE) ×3 IMPLANT
TRAY FOLEY MTR SLVR 16FR STAT (SET/KITS/TRAYS/PACK) ×3 IMPLANT
TROCAR ADV FIXATION 5X100MM (TROCAR) ×3 IMPLANT
TUBING CONNECTING 10 (TUBING) ×4 IMPLANT
TUBING CONNECTING 10' (TUBING) ×2
TUBING INSUFFLATION 10FT LAP (TUBING) ×3 IMPLANT

## 2021-06-25 NOTE — H&P (Signed)
Jane [name] is a 35 y.o. female who is seen today for f/u of RLQ pain.  CT shows a stable mass.  It appears she saw her gynecologist and was placed on Depo for this but reports no change in symptoms.  We decided to go ahead with diagnostic laparoscopy.  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  Biopsy showed inflammation only.  Since that time she has had trouble with pain and possible small bowel obstructions.         Past Medical History:  Diagnosis Date   Anemia      childhood   Anxiety     Bladder infection     Endometriosis     GERD (gastroesophageal reflux disease)     Migraine     UTI (lower urinary tract infection)           Past Surgical History:  Procedure Laterality Date   COLONOSCOPY        2020   ESOPHAGOGASTRODUODENOSCOPY ENDOSCOPY       LAPAROSCOPY N/A 03/20/2021    Procedure: DIAGNOSTIC LAPAROSCOPY AND BIOPSY;  Surgeon: Romie Levee, MD;  Location: [facility];  Service: General;  Laterality: N/A;   UMBILICAL HERNIA REPAIR        Social History         Socioeconomic History   Marital status: Legally Separated      Spouse name: Not on file   Number of children: Not on file   Years of education: Not on file   Highest education level: Not on file  Occupational History   Not on file  Tobacco Use   Smoking status: Never   Smokeless tobacco: Never  Vaping Use   Vaping Use: Never used  Substance and Sexual Activity   Alcohol use: Yes      Comment: socc.   Drug use: No   Sexual activity: Yes      Birth control/protection: Injection  Other Topics Concern   Not on file  Social History Narrative   Not on file    Social Determinants of Health    Financial Resource Strain: Not on file  Food Insecurity: Not on file  Transportation Needs: Not on file  Physical Activity: Not on file  Stress: Not on file  Social Connections: Not on file  Intimate Partner Violence: Not on file          Family History  Problem Relation Age of Onset   Endometriosis Sister     Hypertension Maternal Grandmother     Stroke Maternal Grandmother     Cancer Maternal Grandmother     Cancer Paternal Grandmother     Stomach cancer Other     Rectal cancer Other     Esophageal cancer Other     Colon cancer Other        Current Outpatient Medications:    Cholecalciferol (VITAMIN D) 125 MCG (5000 UT) CAPS, Take 1 capsule by mouth daily., Disp: 30 capsule, Rfl: 0   famotidine (PEPCID) 20 MG tablet, Take 1 tablet (20 mg total) by mouth 2 (two) times daily., Disp: 60 tablet, Rfl: 2   medroxyPROGESTERone Acetate 150 MG/ML SUSY, Inject 1 mL into the muscle every 3 (three) months., Disp: , Rfl:    omeprazole (PRILOSEC) 40 MG capsule, Take 1 capsule (40 mg total) by mouth daily. (Patient taking differently: Take 1 capsule by mouth in the morning and at bedtime.),  Disp: 90 capsule, Rfl: 2   promethazine (PHENERGAN) 25 MG tablet, Take 1 tablet (25 mg total) by mouth every 6 (six) hours as needed for nausea or vomiting., Disp: 30 tablet, Rfl: 0   sucralfate (CARAFATE) 1 g tablet, Take 1 g by mouth 2 (two) times daily., Disp: , Rfl:    Current Facility-Administered Medications:    0.9 %  sodium chloride infusion, 500 mL, Intravenous, Once, Tressia Danas, MD       Allergies  Allergen Reactions   Toradol [Ketorolac Tromethamine] Anxiety      suicidal   Clindamycin/Lincomycin Hives   Vicodin [Hydrocodone-Acetaminophen] Nausea And Vomiting    Review of Systems - Negative except in HPI     Physical Examination:    BP 110/79    Pulse 79    Temp 99.1 F (37.3 C) (Oral)    Ht 5' 6.75" (1.695 m)    Wt 60.7 kg    LMP 06/09/2021    SpO2 99%    BMI 21.12 kg/m    Gen: NAD CV: RRR Lung: CTA Abd: Soft, nondistended Incision: Healing appropriately   Assessment and Plan:    Jane [name] is a 35 y.o. female who underwent diagnostic laparoscopy with biopsy.  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  I recommended ileocecectomy.  The surgery and anatomy were described to the patient as well as the risks of surgery and the possible complications.  These include: Bleeding, deep abdominal infections and possible wound complications such as hernia and infection, damage to adjacent structures, leak of surgical connections, which can lead to other surgeries and possibly an ostomy, possible need for other procedures, such as abscess drains in radiology, possible prolonged hospital stay, possible diarrhea from removal of part of the colon, possible constipation from narcotics, possible bowel, bladder or sexual dysfunction if having rectal surgery, prolonged fatigue/weakness or appetite loss, possible early recurrence of of disease, possible complications of their medical problems such as heart disease or arrhythmias or lung problems, death (less than 1%). I believe the patient understands and wishes to proceed with the surgery.

## 2021-06-25 NOTE — Anesthesia Preprocedure Evaluation (Addendum)
Anesthesia Evaluation  Patient identified by MRN, date of birth, ID band Patient awake    Reviewed: Allergy & Precautions, NPO status , Patient's Chart, lab work & pertinent test results  Airway Mallampati: I  TM Distance: >3 FB Neck ROM: Full    Dental  (+) Teeth Intact, Dental Advisory Given   Pulmonary neg pulmonary ROS,    Pulmonary exam normal breath sounds clear to auscultation       Cardiovascular negative cardio ROS Normal cardiovascular exam Rhythm:Regular Rate:Normal     Neuro/Psych  Headaches, PSYCHIATRIC DISORDERS Anxiety    GI/Hepatic Neg liver ROS, GERD  Medicated,Abdominal mass   Endo/Other  negative endocrine ROS  Renal/GU negative Renal ROS Bladder dysfunction      Musculoskeletal negative musculoskeletal ROS (+)   Abdominal   Peds  Hematology  (+) Blood dyscrasia, anemia ,   Anesthesia Other Findings Day of surgery medications reviewed with the patient.  Reproductive/Obstetrics                             Anesthesia Physical  Anesthesia Plan  ASA: 3  Anesthesia Plan: General   Post-op Pain Management: Ofirmev IV (intra-op), Dilaudid IV and Ketamine IV   Induction: Intravenous  PONV Risk Score and Plan: 4 or greater and Dexamethasone, Ondansetron and Treatment may vary due to age or medical condition  Airway Management Planned: Oral ETT  Additional Equipment: None  Intra-op Plan:   Post-operative Plan: Extubation in OR  Informed Consent: I have reviewed the patients History and Physical, chart, labs and discussed the procedure including the risks, benefits and alternatives for the proposed anesthesia with the patient or authorized representative who has indicated his/her understanding and acceptance.     Dental advisory given  Plan Discussed with: CRNA and Anesthesiologist  Anesthesia Plan Comments:        Anesthesia Quick Evaluation

## 2021-06-25 NOTE — Op Note (Signed)
06/25/2021  3:01 PM  PATIENT:  Jane [name]  35 y.o. female  Patient Care Team: Leilani Able, MD as PCP - General (Family Medicine) Jolayne Panther, Gigi Gin, MD as Consulting Physician (Obstetrics and Gynecology)  PRE-OPERATIVE DIAGNOSIS:  ENDOMETRIOSIS  POST-OPERATIVE DIAGNOSIS:  ENDOMETRIOSIS  PROCEDURE:  ROBOTIC ILEOCECECTOMY    Surgeon(s): Romie Levee, MD Karie Soda, MD  ASSISTANT: Dr Michaell Cowing   ANESTHESIA:   local and general  EBL: 87ml Total I/O In: 1000 [I.V.:1000] Out: 225 [Urine:150; Blood:75]  Delay start of Pharmacological VTE agent (>24hrs) due to surgical blood loss or risk of bleeding:  no  DRAINS: none   SPECIMEN:  Source of Specimen:  terminal ileum and cecum  DISPOSITION OF SPECIMEN:  PATHOLOGY  COUNTS:  YES  PLAN OF CARE: Admit to inpatient   PATIENT DISPOSITION:  PACU - hemodynamically stable.  INDICATION:    35 y.o. F with endometriosis thought to be causing adhesions between her ileum and cecum causing obstruction.  I recommended segmental resection:  The anatomy & physiology of the digestive tract was discussed.  The pathophysiology was discussed.  Natural history risks without surgery was discussed.   I worked to give an overview of the disease and the frequent need to have multispecialty involvement.  I feel the risks of no intervention will lead to serious problems that outweigh the operative risks; therefore, I recommended a partial colectomy to remove the pathology.  Robotic & open techniques were discussed.   Risks such as bleeding, infection, abscess, leak, reoperation, possible ostomy, hernia, heart attack, death, and other risks were discussed.  I noted a good likelihood this will help address the problem.   Goals of post-operative recovery were discussed as well.    The patient expressed understanding & wished to proceed with surgery.  OR FINDINGS:   Patient had several areas of endometriosis in her pelvis and one area  on her ventral abdominal wall as well as ileocolonic involvement    DESCRIPTION:   Informed consent was confirmed.  The patient underwent general anaesthesia without difficulty.  The patient was positioned appropriately.  VTE prevention in place.  The patient's abdomen was clipped, prepped, & draped in a sterile fashion.  Surgical timeout confirmed our plan.  The patient was positioned in reverse Trendelenburg.  Abdominal entry was gained using a Varies needle in the LUQ.  Entry was clean.  I induced carbon dioxide insufflation.  An 42mm robotic port was placed in the LUQ.  Camera inspection revealed no injury.  Extra ports were carefully placed under direct laparoscopic visualization.  I laparoscopically reflected the greater omentum and the upper abdomen the small bowel in the peilvis. The patient was appropriately positioned and the robot was docked to the patient's right side.  Instruments were placed under direct visualization.    I began by elevating the terminal ileum the cecum.  I then identified an area in the terminal ileum that was clear of disease.  I divided the mesentery just underneath this using the robotic vessel sealer.  I continued my dissection through the mesentery and then divided the remaining mesentery to the level of the cecum using the robotic vessel sealer.  Identified a portion of cecum past the ileocecal valve.  The mesentery was then skeletonized at this point.    At that point, the terminal ileum was divided with a blue load robotic 60 mm stapler.  The ascending colon was divided with a green load load robotic stapler x2 just proximal to the ileocolonic artery.  The specimen was then completely free and placed in the right upper quadrant.  Hemostasis was good.  I then oriented the remaining terminal ileum and transverse colon and an end-to-end fashion.  I placed an enterotomy in the small bowel and colon using the robotic scissors.  I then introduced a white load 60 mm  robotic stapler into both enterotomies and created an anastomosis between the small bowel and transverse colon.  Hemostasis within the staple line was good.  The common enterotomy channel was closed using 2 running 2-0 V-Loc sutures.  The abdomen was then irrigated with normal saline. The omentum was then brought down over the anastomosis.  At this point the robot was undocked.  The 12 mm suprapubic port was enlarged to a Pfannenstiel incision and an Alexis wound protector was placed.  The specimen was removed from the abdomen and evaluated.  Once the abdomen was inspected for hemostasis, the Alexis wound protector was removed.    The peritoneum of the Pfannenstiel incision was closed using a running 0 Vicryl suture.  The fascia was then closed using 2, #1 Novafil running sutures.  The subcutaneous tissue of the extraction incision was closed using a running 2-0 Vicryl suture. The skin was then closed using running subcuticular 4-0 Vicryl sutures.  A sterile dressing was applied.  The remaining port sites were closed using interrupted 4-0 Vicryl sutures and Dermabond. All counts were correct per operating room staff. The patient was then awakened from anesthesia and sent to the post anesthesia care unit in stable condition.

## 2021-06-25 NOTE — Anesthesia Procedure Notes (Signed)
Procedure Name: Intubation Date/Time: 06/25/2021 12:53 PM Performed by: Myna Bright, CRNA Pre-anesthesia Checklist: Patient identified, Emergency Drugs available, Suction available and Patient being monitored Patient Re-evaluated:Patient Re-evaluated prior to induction Oxygen Delivery Method: Circle system utilized Preoxygenation: Pre-oxygenation with 100% oxygen Induction Type: IV induction Ventilation: Mask ventilation without difficulty Laryngoscope Size: Mac and 3 Grade View: Grade I Tube type: Oral Tube size: 7.0 mm Number of attempts: 1 Airway Equipment and Method: Stylet Placement Confirmation: ETT inserted through vocal cords under direct vision, positive ETCO2 and breath sounds checked- equal and bilateral Secured at: 21 cm Tube secured with: Tape Dental Injury: Teeth and Oropharynx as per pre-operative assessment

## 2021-06-25 NOTE — Transfer of Care (Signed)
Immediate Anesthesia Transfer of Care Note  Patient: Jane [name]  Procedure(s) Performed: ROBOTIC ILEOCECECTOMY  Patient Location: PACU  Anesthesia Type:General  Level of Consciousness: awake, alert , oriented and patient cooperative  Airway & Oxygen Therapy: Patient Spontanous Breathing and Patient connected to face mask oxygen  Post-op Assessment: Report given to RN and Post -op Vital signs reviewed and stable  Post vital signs: Reviewed and stable  Last Vitals:  Vitals Value Taken Time  BP 117/71 06/25/21 1515  Temp 36.4 C 06/25/21 1512  Pulse 82 06/25/21 1515  Resp 8 06/25/21 1515  SpO2 100 % 06/25/21 1515  Vitals shown include unvalidated device data.  Last Pain:  Vitals:   06/25/21 1056  TempSrc: Oral  PainSc: 2       Patients Stated Pain Goal: 2 (06/25/21 1056)  Complications: No notable events documented.

## 2021-06-26 ENCOUNTER — Other Ambulatory Visit (HOSPITAL_COMMUNITY): Payer: Self-pay

## 2021-06-26 ENCOUNTER — Encounter (HOSPITAL_COMMUNITY): Payer: Self-pay | Admitting: General Surgery

## 2021-06-26 LAB — BASIC METABOLIC PANEL
Anion gap: 7 (ref 5–15)
BUN: 6 mg/dL (ref 6–20)
CO2: 24 mmol/L (ref 22–32)
Calcium: 8.8 mg/dL — ABNORMAL LOW (ref 8.9–10.3)
Chloride: 106 mmol/L (ref 98–111)
Creatinine, Ser: 0.75 mg/dL (ref 0.44–1.00)
GFR, Estimated: >60 mL/min (ref >60)
Glucose, Bld: 146 mg/dL — ABNORMAL HIGH (ref 70–99)
Potassium: 4.1 mmol/L (ref 3.5–5.1)
Sodium: 137 mmol/L (ref 135–145)

## 2021-06-26 LAB — CBC
HCT: 34.3 % — ABNORMAL LOW (ref 36.0–46.0)
Hemoglobin: 10.8 g/dL — ABNORMAL LOW (ref 12.0–15.0)
MCH: 28.8 pg (ref 26.0–34.0)
MCHC: 31.5 g/dL (ref 30.0–36.0)
MCV: 91.5 fL (ref 80.0–100.0)
Platelets: 188 10*3/uL (ref 150–400)
RBC: 3.75 MIL/uL — ABNORMAL LOW (ref 3.87–5.11)
RDW: 13.1 % (ref 11.5–15.5)
WBC: 13 10*3/uL — ABNORMAL HIGH (ref 4.0–10.5)
nRBC: 0 % (ref 0.0–0.2)

## 2021-06-26 MED ORDER — MAGIC MOUTHWASH
15.0000 mL | Freq: Four times a day (QID) | ORAL | Status: DC | PRN
  Filled 2021-06-26: qty 15

## 2021-06-26 MED ORDER — LACTATED RINGERS IV BOLUS: 1000.0000 mL | Freq: Three times a day (TID) | INTRAVENOUS | Status: AC | PRN

## 2021-06-26 MED ORDER — LIP MEDEX EX OINT
1.0000 "application " | TOPICAL_OINTMENT | Freq: Two times a day (BID) | CUTANEOUS | Status: DC
  Administered 2021-06-26 – 2021-07-02 (×13): 1 via TOPICAL
  Filled 2021-06-26 (×2): qty 7

## 2021-06-26 MED ORDER — METHOCARBAMOL 1000 MG/10ML IJ SOLN
1000.0000 mg | Freq: Four times a day (QID) | INTRAVENOUS | Status: DC | PRN
  Administered 2021-06-27 – 2021-06-28 (×2): 1000 mg via INTRAVENOUS
  Filled 2021-06-26 (×3): qty 10
  Filled 2021-06-26: qty 1000
  Filled 2021-06-26: qty 10

## 2021-06-26 MED ORDER — MENTHOL 3 MG MT LOZG: 1.0000 | LOZENGE | OROMUCOSAL | Status: DC | PRN

## 2021-06-26 MED ORDER — ENALAPRILAT 1.25 MG/ML IV SOLN
0.6250 mg | Freq: Four times a day (QID) | INTRAVENOUS | Status: DC | PRN
  Filled 2021-06-26: qty 1

## 2021-06-26 MED ORDER — METOCLOPRAMIDE HCL 5 MG/ML IJ SOLN
5.0000 mg | Freq: Three times a day (TID) | INTRAMUSCULAR | Status: DC | PRN
Start: 2021-06-26 — End: 2021-07-02
  Administered 2021-07-01: 19:00:00 5 mg via INTRAVENOUS
  Filled 2021-06-26: qty 2

## 2021-06-26 MED ORDER — PHENOL 1.4 % MT LIQD: 2.0000 | OROMUCOSAL | Status: DC | PRN

## 2021-06-26 MED ORDER — PROCHLORPERAZINE EDISYLATE 10 MG/2ML IJ SOLN
5.0000 mg | INTRAMUSCULAR | Status: DC | PRN
  Administered 2021-06-26: 10 mg via INTRAVENOUS
  Filled 2021-06-26: qty 2

## 2021-06-26 MED ORDER — ONDANSETRON HCL 4 MG/2ML IJ SOLN
4.0000 mg | Freq: Four times a day (QID) | INTRAMUSCULAR | Status: DC | PRN
  Administered 2021-06-26 – 2021-07-02 (×11): 4 mg via INTRAVENOUS
  Filled 2021-06-26 (×9): qty 2

## 2021-06-26 MED ORDER — SODIUM CHLORIDE 0.9 % IV SOLN
8.0000 mg | Freq: Four times a day (QID) | INTRAVENOUS | Status: DC | PRN
  Filled 2021-06-26: qty 4

## 2021-06-26 MED ORDER — METOPROLOL TARTRATE 5 MG/5ML IV SOLN: 5.0000 mg | Freq: Four times a day (QID) | INTRAVENOUS | Status: DC | PRN

## 2021-06-26 MED ORDER — MEDROXYPROGESTERONE ACETATE 150 MG/ML IM SUSY
150.0000 mg | PREFILLED_SYRINGE | Freq: Once | INTRAMUSCULAR | Status: AC
  Administered 2021-06-26: 150 mg via INTRAMUSCULAR
  Filled 2021-06-26: qty 1

## 2021-06-26 NOTE — Progress Notes (Signed)
Pt refusing to get OOB this am, states pain is too bad. Offered pt po pain meds, pt refused, stated 'I'll just lay here and sleep until its time for my Dilaudid." Reviewed w pt the complications of bedrest (pneumonia, blood clots, ileus) - worked w pt w IS, only able to do 500. Will cont to monitor.

## 2021-06-26 NOTE — Progress Notes (Signed)
1 Day Post-Op Robotic ileocecectomy Subjective: Pt c/o pain overnight.  Got out of bed to the chair and bathroom today  Objective: Vital signs in last 24 hours: Temp:  [97.4 F (36.3 C)-98.9 F (37.2 C)] 98 F (36.7 C) (12/16 0913) Pulse Rate:  [74-94] 83 (12/16 0913) Resp:  [8-18] 14 (12/16 0913) BP: (109-125)/(62-90) 116/63 (12/16 0913) SpO2:  [98 %-100 %] 100 % (12/16 0913)   Intake/Output from previous day: 12/15 0701 - 12/16 0700 In: 2718.8 [P.O.:360; I.V.:2358.8] Out: 3675 [Urine:3600; Blood:75] Intake/Output this shift: Total I/O In: 120 [P.O.:120] Out: 0    General appearance: cooperative and fatigued GI: normal findings: soft, non-distended  Incision: no significant drainage  Lab Results:  Recent Labs    06/26/21 0431  WBC 13.0*  HGB 10.8*  HCT 34.3*  PLT 188   BMET Recent Labs    06/26/21 0431  NA 137  K 4.1  CL 106  CO2 24  GLUCOSE 146*  BUN 6  CREATININE 0.75  CALCIUM 8.8*   PT/INR No results for input(s): LABPROT, INR in the last 72 hours. ABG No results for input(s): PHART, HCO3 in the last 72 hours.  Invalid input(s): PCO2, PO2  MEDS, Scheduled  alvimopan  12 mg Oral BID   enoxaparin (LOVENOX) injection  40 mg Subcutaneous Q24H   famotidine  20 mg Oral BID   feeding supplement  237 mL Oral BID BM   gabapentin  300 mg Oral BID   lip balm  1 application Topical BID   medroxyPROGESTERone Acetate  150 mg Intramuscular Once   pantoprazole  80 mg Oral Daily   saccharomyces boulardii  250 mg Oral BID    Studies/Results: No results found.  Assessment: s/p Procedure(s): ROBOTIC ILEOCECECTOMY Patient Active Problem List   Diagnosis Date Noted   Endometriosis 06/25/2021   Anemia 12/18/2020   Intractable nausea and vomiting 07/27/2019   Hypocalcemia 07/27/2019   Dehydration 07/27/2019   Vitamin D deficiency 09/09/2014   Dental cavities 10/09/2013   Abdominal bloating 10/09/2013   Right shoulder pain 10/09/2013   Endometriosis  of pelvic peritoneum 01/09/2011   Other and unspecified ovarian cyst 01/09/2011   Cocaine abuse, unspecified 01/09/2011    Expected post op course  Plan: d/c foley Advance diet as tolerated, cont clears for now   LOS: 1 day     .Vanita Panda, MD [facility], [place]    06/26/2021 11:56 AM

## 2021-06-26 NOTE — Progress Notes (Signed)
MD Gross was paged because patient was patient was complaining of nausea.

## 2021-06-26 NOTE — Anesthesia Postprocedure Evaluation (Signed)
Anesthesia Post Note  Patient: Jane [name]  Procedure(s) Performed: ROBOTIC ILEOCECECTOMY     Patient location during evaluation: PACU Anesthesia Type: General Level of consciousness: awake and alert Pain management: pain level controlled Vital Signs Assessment: post-procedure vital signs reviewed and stable Respiratory status: spontaneous breathing, nonlabored ventilation, respiratory function stable and patient connected to nasal cannula oxygen Cardiovascular status: blood pressure returned to baseline and stable Postop Assessment: no apparent nausea or vomiting Anesthetic complications: no   No notable events documented.  Last Vitals:  Vitals:   06/26/21 0913 06/26/21 1338  BP: 116/63 118/82  Pulse: 83 72  Resp: 14 14  Temp: 36.7 C 36.9 C  SpO2: 100% 100%    Last Pain:  Vitals:   06/26/21 1338  TempSrc: Oral  PainSc:    Pain Goal: Patients Stated Pain Goal: 2 (06/26/21 0913)                 Claudell Rhody

## 2021-06-26 NOTE — Progress Notes (Signed)
Transition of Care [facility]) Screening Note  Patient Details  Name: Jane [name] Date of Birth: 17-Jul-1985  Transition of Care [facility]) CM/SW Contact:    Ewing Schlein, LCSW Phone Number: 06/26/2021, 12:13 PM  Transition of Care Department [facility]) has reviewed patient and no TOC needs have been identified at this time. We will continue to monitor patient advancement through interdisciplinary progression rounds. If new patient transition needs arise, please place a TOC consult.

## 2021-06-26 NOTE — Progress Notes (Signed)
Attempted to ambulate patient, patient only able to take a few steps to the recliner, she was moaning and in pain. She sat in recliner for a few seconds and requested to get back in bed. Patient was helped back to bed and PRN pain medicine was given. Will attempt to ambulate later.

## 2021-06-27 LAB — BASIC METABOLIC PANEL
Anion gap: 8 (ref 5–15)
BUN: 5 mg/dL — ABNORMAL LOW (ref 6–20)
CO2: 24 mmol/L (ref 22–32)
Calcium: 8.8 mg/dL — ABNORMAL LOW (ref 8.9–10.3)
Chloride: 103 mmol/L (ref 98–111)
Creatinine, Ser: 0.78 mg/dL (ref 0.44–1.00)
GFR, Estimated: >60 mL/min (ref >60)
Glucose, Bld: 120 mg/dL — ABNORMAL HIGH (ref 70–99)
Potassium: 3.8 mmol/L (ref 3.5–5.1)
Sodium: 135 mmol/L (ref 135–145)

## 2021-06-27 LAB — CBC
HCT: 31.6 % — ABNORMAL LOW (ref 36.0–46.0)
Hemoglobin: 10.1 g/dL — ABNORMAL LOW (ref 12.0–15.0)
MCH: 29.2 pg (ref 26.0–34.0)
MCHC: 32 g/dL (ref 30.0–36.0)
MCV: 91.3 fL (ref 80.0–100.0)
Platelets: 172 10*3/uL (ref 150–400)
RBC: 3.46 MIL/uL — ABNORMAL LOW (ref 3.87–5.11)
RDW: 13.4 % (ref 11.5–15.5)
WBC: 8.8 10*3/uL (ref 4.0–10.5)
nRBC: 0 % (ref 0.0–0.2)

## 2021-06-27 MED ORDER — OXYCODONE-ACETAMINOPHEN 5-325 MG PO TABS
1.0000 | ORAL_TABLET | Freq: Four times a day (QID) | ORAL | Status: DC | PRN
Start: 2021-06-27 — End: 2021-07-02
  Administered 2021-07-01 – 2021-07-02 (×3): 1 via ORAL
  Filled 2021-06-27 (×3): qty 1

## 2021-06-27 NOTE — Progress Notes (Signed)
°   06/27/21 1400  Mobility  Activity Ambulated in hall  Level of Assistance Contact guard assist, steadying assist  Assistive Device Front wheel walker  Distance Ambulated (ft) 90 ft  Mobility Ambulated with assistance in hallway  Mobility Response Tolerated well  Mobility performed by Mobility specialist  $Mobility charge 1 Mobility   Pt agreeable to mobilize this afternoon. Pt required extra time during session secondary to abdominal pain and cramping which she believes is from "gas bubbles". Ambulated about 48ft in hall with RW, tolerated well-fair. Left pt in bed with call bell at side and friend present. RN notified of session.   Timoteo Expose Mobility Specialist Acute Rehab Services Office: [phone]

## 2021-06-27 NOTE — Progress Notes (Addendum)
Attempted to give patient ultram for pain, patient stated she needs something on her stomach to take oral pain medication. RN gave patient chicken broth and jello but patient continues to refuse to take oral pain medication because she states that "it does not help for her pain."

## 2021-06-27 NOTE — Progress Notes (Signed)
2 Days Post-Op   Subjective/Chief Complaint: Complains of pain   Objective: Vital signs in last 24 hours: Temp:  [98 F (36.7 C)-98.9 F (37.2 C)] 98.9 F (37.2 C) (12/17 0504) Pulse Rate:  [72-102] 90 (12/17 0504) Resp:  [14-18] 18 (12/17 0504) BP: (108-128)/(59-82) 108/59 (12/17 0504) SpO2:  [97 %-100 %] 97 % (12/17 0504) Last BM Date: 06/25/21  Intake/Output from previous day: 12/16 0701 - 12/17 0700 In: 1885.2 [P.O.:1560; I.V.:325.2] Out: 0  Intake/Output this shift: No intake/output data recorded.  General appearance: alert and cooperative Resp: clear to auscultation bilaterally Cardio: regular rate and rhythm GI: soft with moderate diffuse tenderness. Quiet. Incisions ok  Lab Results:  Recent Labs    06/26/21 0431 06/27/21 0501  WBC 13.0* 8.8  HGB 10.8* 10.1*  HCT 34.3* 31.6*  PLT 188 172   BMET Recent Labs    06/26/21 0431 06/27/21 0501  NA 137 135  K 4.1 3.8  CL 106 103  CO2 24 24  GLUCOSE 146* 120*  BUN 6 5*  CREATININE 0.75 0.78  CALCIUM 8.8* 8.8*   PT/INR No results for input(s): LABPROT, INR in the last 72 hours. ABG No results for input(s): PHART, HCO3 in the last 72 hours.  Invalid input(s): PCO2, PO2  Studies/Results: No results found.  Anti-infectives: Anti-infectives (From admission, onward)    Start     Dose/Rate Route Frequency Ordered Stop   06/25/21 1052  sodium chloride 0.9 % with cefoTEtan (CEFOTAN) ADS Med       Note to Pharmacy: Marney Doctor J: cabinet override      06/25/21 1052 06/25/21 1310   06/25/21 1045  cefoTEtan (CEFOTAN) 2 g in sodium chloride 0.9 % 100 mL IVPB        2 g 200 mL/hr over 30 Minutes Intravenous On call to O.R. 06/25/21 1041 06/25/21 1301       Assessment/Plan: s/p Procedure(s): ROBOTIC ILEOCECECTOMY (N/A) Advance diet. Stay with clears until ileus resolves Minimize oral pain meds with ileus ambulate  LOS: 2 days    [name] III 06/27/2021

## 2021-06-28 LAB — CBC
HCT: 34.6 % — ABNORMAL LOW (ref 36.0–46.0)
Hemoglobin: 11 g/dL — ABNORMAL LOW (ref 12.0–15.0)
MCH: 28.7 pg (ref 26.0–34.0)
MCHC: 31.8 g/dL (ref 30.0–36.0)
MCV: 90.3 fL (ref 80.0–100.0)
Platelets: 177 10*3/uL (ref 150–400)
RBC: 3.83 MIL/uL — ABNORMAL LOW (ref 3.87–5.11)
RDW: 13.3 % (ref 11.5–15.5)
WBC: 7.4 10*3/uL (ref 4.0–10.5)
nRBC: 0 % (ref 0.0–0.2)

## 2021-06-28 LAB — BASIC METABOLIC PANEL
Anion gap: 8 (ref 5–15)
BUN: <5 mg/dL — ABNORMAL LOW (ref 6–20)
CO2: 25 mmol/L (ref 22–32)
Calcium: 9 mg/dL (ref 8.9–10.3)
Chloride: 103 mmol/L (ref 98–111)
Creatinine, Ser: 0.68 mg/dL (ref 0.44–1.00)
GFR, Estimated: >60 mL/min (ref >60)
Glucose, Bld: 95 mg/dL (ref 70–99)
Potassium: 3.7 mmol/L (ref 3.5–5.1)
Sodium: 136 mmol/L (ref 135–145)

## 2021-06-28 MED ORDER — SUCRALFATE 1 GM/10ML PO SUSP
1.0000 g | Freq: Three times a day (TID) | ORAL | Status: DC
  Administered 2021-06-28 – 2021-07-01 (×12): 1 g via ORAL
  Filled 2021-06-28 (×15): qty 10

## 2021-06-28 MED ORDER — FENTANYL CITRATE PF 50 MCG/ML IJ SOSY
12.5000 ug | PREFILLED_SYRINGE | INTRAMUSCULAR | Status: DC | PRN
Start: 2021-06-28 — End: 2021-06-30
  Administered 2021-06-28: 22:00:00 12.5 ug via INTRAVENOUS
  Administered 2021-06-29 (×8): 25 ug via INTRAVENOUS
  Administered 2021-06-30: 11:00:00 12.5 ug via INTRAVENOUS
  Administered 2021-06-30: 06:00:00 25 ug via INTRAVENOUS
  Filled 2021-06-28 (×11): qty 1

## 2021-06-28 NOTE — Progress Notes (Signed)
3 Days Post-Op   Subjective/Chief Complaint: Complains of abd pain. No flatus yet   Objective: Vital signs in last 24 hours: Temp:  [98.5 F (36.9 C)-100.2 F (37.9 C)] 98.5 F (36.9 C) (12/18 0601) Pulse Rate:  [92-96] 94 (12/18 0601) Resp:  [15-18] 16 (12/18 0601) BP: (120-127)/(77-82) 120/80 (12/18 0601) SpO2:  [99 %-100 %] 100 % (12/18 0601) Weight:  [67.9 kg] 67.9 kg (12/18 0500) Last BM Date: 06/25/21  Intake/Output from previous day: 12/17 0701 - 12/18 0700 In: 1660 [P.O.:1560; IV Piggyback:100] Out: -  Intake/Output this shift: No intake/output data recorded.  General appearance: alert and cooperative Resp: clear to auscultation bilaterally Cardio: regular rate and rhythm GI: soft, moderate diffuse tenderness. Few bs  Lab Results:  Recent Labs    06/27/21 0501 06/28/21 0440  WBC 8.8 7.4  HGB 10.1* 11.0*  HCT 31.6* 34.6*  PLT 172 177   BMET Recent Labs    06/27/21 0501 06/28/21 0440  NA 135 136  K 3.8 3.7  CL 103 103  CO2 24 25  GLUCOSE 120* 95  BUN 5* <5*  CREATININE 0.78 0.68  CALCIUM 8.8* 9.0   PT/INR No results for input(s): LABPROT, INR in the last 72 hours. ABG No results for input(s): PHART, HCO3 in the last 72 hours.  Invalid input(s): PCO2, PO2  Studies/Results: No results found.  Anti-infectives: Anti-infectives (From admission, onward)    Start     Dose/Rate Route Frequency Ordered Stop   06/25/21 1052  sodium chloride 0.9 % with cefoTEtan (CEFOTAN) ADS Med       Note to Pharmacy: Jane [name] J: cabinet override      06/25/21 1052 06/25/21 1310   06/25/21 1045  cefoTEtan (CEFOTAN) 2 g in sodium chloride 0.9 % 100 mL IVPB        2 g 200 mL/hr over 30 Minutes Intravenous On call to O.R. 06/25/21 1041 06/25/21 1301       Assessment/Plan: s/p Procedure(s): ROBOTIC ILEOCECECTOMY (N/A) Continue bowel rest until ileus resolves.  Ambulate POD 3   LOS: 3 days    Jane [name] 06/28/2021

## 2021-06-29 ENCOUNTER — Other Ambulatory Visit (HOSPITAL_COMMUNITY): Payer: Self-pay

## 2021-06-29 LAB — SURGICAL PATHOLOGY

## 2021-06-29 MED ORDER — WITCH HAZEL-GLYCERIN EX PADS
MEDICATED_PAD | CUTANEOUS | Status: DC | PRN
  Filled 2021-06-29: qty 100

## 2021-06-29 MED ORDER — DOCUSATE SODIUM 100 MG PO CAPS
100.0000 mg | ORAL_CAPSULE | Freq: Two times a day (BID) | ORAL | Status: DC
  Administered 2021-06-29 – 2021-07-02 (×5): 100 mg via ORAL
  Filled 2021-06-29 (×7): qty 1

## 2021-06-29 NOTE — Progress Notes (Signed)
4 Days Post-Op Robotic ileocecectomy Subjective: Pt c/o pain throughout the weekend.  Not ambulating much per pt, no flatus, trying Fentanyl for pain  Objective: Vital signs in last 24 hours: Temp:  [98.4 F (36.9 C)-98.8 F (37.1 C)] 98.4 F (36.9 C) (12/19 0537) Pulse Rate:  [93-96] 93 (12/19 0537) Resp:  [16] 16 (12/19 0537) BP: (119-123)/(71-81) 123/81 (12/19 0537) SpO2:  [99 %-100 %] 99 % (12/19 0537)   Intake/Output from previous day: 12/18 0701 - 12/19 0700 In: 1640 [P.O.:1640] Out: 0  Intake/Output this shift: No intake/output data recorded.   General appearance: cooperative and fatigued GI: normal findings: soft, non-distended  Incision: no significant drainage  Lab Results:  Recent Labs    06/27/21 0501 06/28/21 0440  WBC 8.8 7.4  HGB 10.1* 11.0*  HCT 31.6* 34.6*  PLT 172 177    BMET Recent Labs    06/27/21 0501 06/28/21 0440  NA 135 136  K 3.8 3.7  CL 103 103  CO2 24 25  GLUCOSE 120* 95  BUN 5* <5*  CREATININE 0.78 0.68  CALCIUM 8.8* 9.0    PT/INR No results for input(s): LABPROT, INR in the last 72 hours. ABG No results for input(s): PHART, HCO3 in the last 72 hours.  Invalid input(s): PCO2, PO2  MEDS, Scheduled  alvimopan  12 mg Oral BID   enoxaparin (LOVENOX) injection  40 mg Subcutaneous Q24H   famotidine  20 mg Oral BID   feeding supplement  237 mL Oral BID BM   gabapentin  300 mg Oral BID   lip balm  1 application Topical BID   pantoprazole  80 mg Oral Daily   saccharomyces boulardii  250 mg Oral BID   sucralfate  1 g Oral TID WC & HS    Studies/Results: No results found.  Assessment: s/p Procedure(s): ROBOTIC ILEOCECECTOMY Patient Active Problem List   Diagnosis Date Noted   Endometriosis 06/25/2021   Anemia 12/18/2020   Intractable nausea and vomiting 07/27/2019   Hypocalcemia 07/27/2019   Dehydration 07/27/2019   Vitamin D deficiency 09/09/2014   Dental cavities 10/09/2013   Abdominal bloating 10/09/2013    Right shoulder pain 10/09/2013   Endometriosis of pelvic peritoneum 01/09/2011   Other and unspecified ovarian cyst 01/09/2011   Cocaine abuse, unspecified 01/09/2011    Expected post op course, post op ileus  Plan: cont clears for now Ambulate in hall   LOS: 4 days     .Vanita Panda, MD [facility], [place]    06/29/2021 8:39 AM

## 2021-06-29 NOTE — Progress Notes (Addendum)
Pt has c/o IV Dilaudid not helping with her current pain. Patient wants to try Fentanyl. MD on call paged, new verbal order given for Fentanyl, see MAR for details.  Verbal order to DC Dilaudid.

## 2021-06-30 MED ORDER — FENTANYL CITRATE PF 50 MCG/ML IJ SOSY: 12.5000 ug | PREFILLED_SYRINGE | Freq: Three times a day (TID) | INTRAMUSCULAR | Status: DC | PRN

## 2021-06-30 NOTE — Progress Notes (Signed)
Pt states she is hungry and requests that I call her MD so that she can advance her diet. Pt did have a sm/med sized liquid brown stool this am. Call to MD's office to relay above request.

## 2021-06-30 NOTE — Progress Notes (Addendum)
5 Days Post-Op Robotic ileocecectomy Subjective: Pt c/o less pain, Not ambulating much, having BM's  Objective: Vital signs in last 24 hours: Temp:  [98.5 F (36.9 C)-98.6 F (37 C)] 98.6 F (37 C) (12/20 0522) Pulse Rate:  [76] 76 (12/20 0522) Resp:  [14-16] 14 (12/20 0522) BP: (103-120)/(69-72) 103/69 (12/20 0522) SpO2:  [100 %] 100 % (12/20 0522) Weight:  [65.3 kg] 65.3 kg (12/20 0500)   Intake/Output from previous day: 12/19 0701 - 12/20 0700 In: 260 [P.O.:260] Out: 0  Intake/Output this shift: Total I/O In: 120 [P.O.:120] Out: 1 [Stool:1]   General appearance: cooperative and fatigued GI: normal findings: soft, non-distended  Incision: no significant drainage  Lab Results:  Recent Labs    06/28/21 0440  WBC 7.4  HGB 11.0*  HCT 34.6*  PLT 177    BMET Recent Labs    06/28/21 0440  NA 136  K 3.7  CL 103  CO2 25  GLUCOSE 95  BUN <5*  CREATININE 0.68  CALCIUM 9.0    PT/INR No results for input(s): LABPROT, INR in the last 72 hours. ABG No results for input(s): PHART, HCO3 in the last 72 hours.  Invalid input(s): PCO2, PO2  MEDS, Scheduled  alvimopan  12 mg Oral BID   docusate sodium  100 mg Oral BID   enoxaparin (LOVENOX) injection  40 mg Subcutaneous Q24H   famotidine  20 mg Oral BID   feeding supplement  237 mL Oral BID BM   gabapentin  300 mg Oral BID   lip balm  1 application Topical BID   pantoprazole  80 mg Oral Daily   saccharomyces boulardii  250 mg Oral BID   sucralfate  1 g Oral TID WC & HS    Studies/Results: No results found.  Assessment: s/p Procedure(s): ROBOTIC ILEOCECECTOMY Patient Active Problem List   Diagnosis Date Noted   Endometriosis 06/25/2021   Anemia 12/18/2020   Intractable nausea and vomiting 07/27/2019   Hypocalcemia 07/27/2019   Dehydration 07/27/2019   Vitamin D deficiency 09/09/2014   Dental cavities 10/09/2013   Abdominal bloating 10/09/2013   Right shoulder pain 10/09/2013   Endometriosis of  pelvic peritoneum 01/09/2011   Other and unspecified ovarian cyst 01/09/2011   Cocaine abuse, unspecified 01/09/2011    Expected post op course, post op ileus resolving  Plan: Advance diet Ambulate in hall   LOS: 5 days     .Vanita Panda, MD [facility], [place]    06/30/2021 1:30 PM

## 2021-06-30 NOTE — Progress Notes (Signed)
Pt has refused to walk any today, states "my GI tract is not cooperating . ." Offered nausea med, pain med, pt has refused.

## 2021-06-30 NOTE — Progress Notes (Addendum)
Have encouraged pt to be OOB as much as possible and to walk halls at least 4 X today. Pt has refused to walk halls thus far today, "my body is not yet ready to do this." Requests IV pain med (Fentanyl) for acid reflux, refuses Mylanta. Pt also refusing Lovenox shot and Protonix but did take pepcid. Will cont to monitor pt.

## 2021-07-01 ENCOUNTER — Other Ambulatory Visit (HOSPITAL_COMMUNITY): Payer: Self-pay

## 2021-07-01 MED ORDER — ONDANSETRON HCL 4 MG PO TABS
4.0000 mg | ORAL_TABLET | Freq: Four times a day (QID) | ORAL | 0 refills | Status: AC | PRN
  Filled 2021-07-01: qty 20, 5d supply, fill #0

## 2021-07-01 MED ORDER — DOCUSATE SODIUM 100 MG PO CAPS
100.0000 mg | ORAL_CAPSULE | Freq: Two times a day (BID) | ORAL | 0 refills | Status: DC
  Filled 2021-07-01: qty 10, 5d supply, fill #0

## 2021-07-01 MED ORDER — OXYCODONE-ACETAMINOPHEN 5-325 MG PO TABS
1.0000 | ORAL_TABLET | Freq: Four times a day (QID) | ORAL | 0 refills | Status: DC | PRN
  Filled 2021-07-01: qty 30, 5d supply, fill #0

## 2021-07-01 NOTE — Discharge Instructions (Signed)
SURGERY: POST OP INSTRUCTIONS (Surgery for small bowel obstruction, colon resection, etc)   ######################################################################  EAT Gradually transition to a high fiber diet with a fiber supplement over the next few days after discharge  WALK Walk an hour a day.  Control your pain to do that.    CONTROL PAIN Control pain so that you can walk, sleep, tolerate sneezing/coughing, go up/down stairs.  HAVE A BOWEL MOVEMENT DAILY Keep your bowels regular to avoid problems.  OK to try a laxative to override constipation.  OK to use an antidairrheal to slow down diarrhea.  Call if not better after 2 tries  CALL IF YOU HAVE PROBLEMS/CONCERNS Call if you are still struggling despite following these instructions. Call if you have concerns not answered by these instructions  ######################################################################   DIET Follow a light diet the first few days at home.  Start with a bland diet such as soups, liquids, starchy foods, low fat foods, etc.  If you feel full, bloated, or constipated, stay on a ful liquid or pureed/blenderized diet for a few days until you feel better and no longer constipated. Be sure to drink plenty of fluids every day to avoid getting dehydrated (feeling dizzy, not urinating, etc.). Gradually add a fiber supplement to your diet over the next week.  Gradually get back to a regular solid diet.  Avoid fast food or heavy meals the first week as you are more likely to get nauseated. It is expected for your digestive tract to need a few months to get back to normal.  It is common for your bowel movements and stools to be irregular.  You will have occasional bloating and cramping that should eventually fade away.  Until you are eating solid food normally, off all pain medications, and back to regular activities; your bowels will not be normal. Focus on eating a low-fat, high fiber diet the rest of your life  (See Getting to Good Bowel Health, below).  CARE of your INCISION or WOUND  It is good for closed incisions and even open wounds to be washed every day.  Shower every day.  Short baths are fine.  Wash the incisions and wounds clean with soap & water.    You may leave closed incisions open to air if it is dry.   You may cover the incision with clean gauze & replace it after your daily shower for comfort.  STAPLES: You have skin staples.  Leave them in place & set up an appointment for them to be removed by a surgery office nurse ~10 days after surgery. = 1st week of January 2024    ACTIVITIES as tolerated Start light daily activities --- self-care, walking, climbing stairs-- beginning the day after surgery.  Gradually increase activities as tolerated.  Control your pain to be active.  Stop when you are tired.  Ideally, walk several times a day, eventually an hour a day.   Most people are back to most day-to-day activities in a few weeks.  It takes 4-8 weeks to get back to unrestricted, intense activity. If you can walk 30 minutes without difficulty, it is safe to try more intense activity such as jogging, treadmill, bicycling, low-impact aerobics, swimming, etc. Save the most intensive and strenuous activity for last (Usually 4-8 weeks after surgery) such as sit-ups, heavy lifting, contact sports, etc.  Refrain from any intense heavy lifting or straining until you are off narcotics for pain control.  You will have off days, but things should improve   week-by-week. DO NOT PUSH THROUGH PAIN.  Let pain be your guide: If it hurts to do something, don't do it.  Pain is your body warning you to avoid that activity for another week until the pain goes down. You may drive when you are no longer taking narcotic prescription pain medication, you can comfortably wear a seatbelt, and you can safely make sudden turns/stops to protect yourself without hesitating due to pain. You may have sexual intercourse when it  is comfortable. If it hurts to do something, stop.  MEDICATIONS Take your usually prescribed home medications unless otherwise directed.   Blood thinners:  Usually you can restart any strong blood thinners after the second postoperative day.  It is OK to take aspirin right away.     If you are on strong blood thinners (warfarin/Coumadin, Plavix, Xerelto, Eliquis, Pradaxa, etc), discuss with your surgeon, medicine PCP, and/or cardiologist for instructions on when to restart the blood thinner & if blood monitoring is needed (PT/INR blood check, etc).     PAIN CONTROL Pain after surgery or related to activity is often due to strain/injury to muscle, tendon, nerves and/or incisions.  This pain is usually short-term and will improve in a few months.  To help speed the process of healing and to get back to regular activity more quickly, DO THE FOLLOWING THINGS TOGETHER: Increase activity gradually.  DO NOT PUSH THROUGH PAIN Use Ice and/or Heat Try Gentle Massage and/or Stretching Take over the counter pain medication Take Narcotic prescription pain medication for more severe pain  Good pain control = faster recovery.  It is better to take more medicine to be more active than to stay in bed all day to avoid medications.  Increase activity gradually Avoid heavy lifting at first, then increase to lifting as tolerated over the next 6 weeks. Do not "push through" the pain.  Listen to your body and avoid positions and maneuvers than reproduce the pain.  Wait a few days before trying something more intense Walking an hour a day is encouraged to help your body recover faster and more safely.  Start slowly and stop when getting sore.  If you can walk 30 minutes without stopping or pain, you can try more intense activity (running, jogging, aerobics, cycling, swimming, treadmill, sex, sports, weightlifting, etc.) Remember: If it hurts to do it, then don't do it! Use Ice and/or Heat You will have swelling and  bruising around the incisions.  This will take several weeks to resolve. Ice packs or heating pads (6-8 times a day, 30-60 minutes at a time) will help sooth soreness & bruising. Some people prefer to use ice alone, heat alone, or alternate between ice & heat.  Experiment and see what works best for you.  Consider trying ice for the first few days to help decrease swelling and bruising; then, switch to heat to help relax sore spots and speed recovery. Shower every day.  Short baths are fine.  It feels good!  Keep the incisions and wounds clean with soap & water.   Try Gentle Massage and/or Stretching Massage at the area of pain many times a day Stop if you feel pain - do not overdo it Take over the counter pain medication This helps the muscle and nerve tissues become less irritable and calm down faster Choose ONE of the following over-the-counter anti-inflammatory medications: Acetaminophen 500mg tabs (Tylenol) 1-2 pills with every meal and just before bedtime (avoid if you have liver problems or if you have   acetaminophen in you narcotic prescription) Naproxen 220mg tabs (ex. Aleve, Naprosyn) 1-2 pills twice a day (avoid if you have kidney, stomach, IBD, or bleeding problems) Ibuprofen 200mg tabs (ex. Advil, Motrin) 3-4 pills with every meal and just before bedtime (avoid if you have kidney, stomach, IBD, or bleeding problems) Take with food/snack several times a day as directed for at least 2 weeks to help keep pain / soreness down & more manageable. Take Narcotic prescription pain medication for more severe pain A prescription for strong pain control is often given to you upon discharge (for example: oxycodone/Percocet, hydrocodone/Norco/Vicodin, or tramadol/Ultram) Take your pain medication as prescribed. Be mindful that most narcotic prescriptions contain Tylenol (acetaminophen) as well - avoid taking too much Tylenol. If you are having problems/concerns with the prescription medicine (does  not control pain, nausea, vomiting, rash, itching, etc.), please call us [phone] to see if we need to switch you to a different pain medicine that will work better for you and/or control your side effects better. If you need a refill on your pain medication, you must call the office before 4 pm and on weekdays only.  By federal law, prescriptions for narcotics cannot be called into a pharmacy.  They must be filled out on paper & picked up from our office by the patient or authorized caretaker.  Prescriptions cannot be filled after 4 pm nor on weekends.    WHEN TO CALL US [phone] Severe uncontrolled or worsening pain  Fever over 101 F (38.5 C) Concerns with the incision: Worsening pain, redness, rash/hives, swelling, bleeding, or drainage Reactions / problems with new medications (itching, rash, hives, nausea, etc.) Nausea and/or vomiting Difficulty urinating Difficulty breathing Worsening fatigue, dizziness, lightheadedness, blurred vision Other concerns If you are not getting better after two weeks or are noticing you are getting worse, contact our office [phone] for further advice.  We may need to adjust your medications, re-evaluate you in the office, send you to the emergency room, or see what other things we can do to help. The clinic staff is available to answer your questions during regular business hours (8:30am-5pm).  Please don't hesitate to call and ask to speak to one of our nurses for clinical concerns.    A surgeon from [facility] is always on call at the hospitals 24 hours/day If you have a medical emergency, go to the nearest emergency room or call 911.  FOLLOW UP in our office One the day of your discharge from the hospital (or the next business weekday), please call [facility] to set up or confirm an appointment to see your surgeon in the office for a follow-up appointment.  Usually it is 2-3 weeks after your surgery.   If you  have skin staples at your incision(s), let the office know so we can set up a time in the office for the nurse to remove them (usually around 10 days after surgery). Make sure that you call for appointments the day of discharge (or the next business weekday) from the hospital to ensure a convenient appointment time. IF YOU HAVE DISABILITY OR FAMILY LEAVE FORMS, BRING THEM TO THE OFFICE FOR PROCESSING.  DO NOT GIVE THEM TO YOUR DOCTOR.  [facility] [address], [address], [place], [place]  [address] ? [phone] - Main [phone] - Toll Free,  [phone] - Fax [email or web address]    GETTING TO GOOD BOWEL HEALTH. It is expected for your digestive tract to   need a few months to get back to normal.  It is common for your bowel movements and stools to be irregular.  You will have occasional bloating and cramping that should eventually fade away.  Until you are eating solid food normally, off all pain medications, and back to regular activities; your bowels will not be normal.   Avoiding constipation The goal: ONE SOFT BOWEL MOVEMENT A DAY!    Drink plenty of fluids.  Choose water first. TAKE A FIBER SUPPLEMENT EVERY DAY THE REST OF YOUR LIFE During your first week back home, gradually add back a fiber supplement every day Experiment which form you can tolerate.   There are many forms such as powders, tablets, wafers, gummies, etc Psyllium bran (Metamucil), methylcellulose (Citrucel), Miralax or Glycolax, Benefiber, Flax Seed.  Adjust the dose week-by-week (1/2 dose/day to 6 doses a day) until you are moving your bowels 1-2 times a day.  Cut back the dose or try a different fiber product if it is giving you problems such as diarrhea or bloating. Sometimes a laxative is needed to help jump-start bowels if constipated until the fiber supplement can help regulate your bowels.  If you are tolerating eating & you are farting, it is okay to try a gentle  laxative such as double dose MiraLax, prune juice, or Milk of Magnesia.  Avoid using laxatives too often. Stool softeners can sometimes help counteract the constipating effects of narcotic pain medicines.  It can also cause diarrhea, so avoid using for too long. If you are still constipated despite taking fiber daily, eating solids, and a few doses of laxatives, call our office. Controlling diarrhea Try drinking liquids and eating bland foods for a few days to avoid stressing your intestines further. Avoid dairy products (especially milk & ice cream) for a short time.  The intestines often can lose the ability to digest lactose when stressed. Avoid foods that cause gassiness or bloating.  Typical foods include beans and other legumes, cabbage, broccoli, and dairy foods.  Avoid greasy, spicy, fast foods.  Every person has some sensitivity to other foods, so listen to your body and avoid those foods that trigger problems for you. Probiotics (such as active yogurt, Align, etc) may help repopulate the intestines and colon with normal bacteria and calm down a sensitive digestive tract Adding a fiber supplement gradually can help thicken stools by absorbing excess fluid and retrain the intestines to act more normally.  Slowly increase the dose over a few weeks.  Too much fiber too soon can backfire and cause cramping & bloating. It is okay to try and slow down diarrhea with a few doses of antidiarrheal medicines.   Bismuth subsalicylate (ex. Kayopectate, Pepto Bismol) for a few doses can help control diarrhea.  Avoid if pregnant.   Loperamide (Imodium) can slow down diarrhea.  Start with one tablet (2mg) first.  Avoid if you are having fevers or severe pain.  ILEOSTOMY PATIENTS WILL HAVE CHRONIC DIARRHEA since their colon is not in use.    Drink plenty of liquids.  You will need to drink even more glasses of water/liquid a day to avoid getting dehydrated. Record output from your ileostomy.  Expect to empty  the bag every 3-4 hours at first.  Most people with a permanent ileostomy empty their bag 4-6 times at the least.   Use antidiarrheal medicine (especially Imodium) several times a day to avoid getting dehydrated.  Start with a dose at bedtime & breakfast.  Adjust up or   down as needed.  Increase antidiarrheal medications as directed to avoid emptying the bag more than 8 times a day (every 3 hours). Work with your wound ostomy nurse to learn care for your ostomy.  See ostomy care instructions. TROUBLESHOOTING IRREGULAR BOWELS 1) Start with a soft & bland diet. No spicy, greasy, or fried foods.  2) Avoid gluten/wheat or dairy products from diet to see if symptoms improve. 3) Miralax 17gm or flax seed mixed in 8oz. water or juice-daily. May use 2-4 times a day as needed. 4) Gas-X, Phazyme, etc. as needed for gas & bloating.  5) Prilosec (omeprazole) over-the-counter as needed 6)  Consider probiotics (Align, Activa, etc) to help calm the bowels down  Call your doctor if you are getting worse or not getting better.  Sometimes further testing (cultures, endoscopy, X-ray studies, CT scans, bloodwork, etc.) may be needed to help diagnose and treat the cause of the diarrhea. [facility] [address], [address], [place], [place]  [address] [phone] - Main.    [phone]  - Toll Free.   [phone] - Fax [email or web address]   ###############################   #######################################################  Ostomy Support Information  You've heard that people get along just fine with only one of their eyes, or one of their lungs, or one of their kidneys. But you also know that you have only one intestine and only one bladder, and that leaves you feeling awfully empty, both physically and emotionally: You think no other people go around without part of their intestine with the ends of their intestines sticking out through their abdominal walls.    YOU ARE NOT ALONE.  There are nearly three quarters of a million people in the [place] who have an ostomy; people who have had surgery to remove all or part of their colons or bladders.   There is even a national association, the [employer] with over 350 local affiliated support groups that are organized by volunteers who provide peer support and counseling. [employer] has a toll free telephone num-ber, 800-826-0826 and an educational, interactive website, www.ostomy.org   An ostomy is an opening in the belly (abdominal wall) made by surgery. Ostomates are people who have had this procedure. The opening (stoma) allows the kidney or bowel to grdischarge waste. An external pouch covers the stoma to collect waste. Pouches are are a simple bag and are odor free. Different companies have disposable or reusable pouches to fit one's lifestyle. An ostomy can either be temporary or permanent.   THERE ARE THREE MAIN TYPES OF OSTOMIES Colostomy. A colostomy is a surgically created opening in the large intestine (colon). Ileostomy. An ileostomy is a surgically created opening in the small intestine. Urostomy. A urostomy is a surgically created opening to divert urine away from the bladder.  OSTOMY Care  The following guidelines will make care of your colostomy easier. Keep this information close by for quick reference.  Helpful DIET hints Eat a well-balanced diet including vegetables and fresh fruits. Eat on a regular schedule.  Drink at least 6 to 8 glasses of fluids daily. Eat slowly in a relaxed atmosphere. Chew your food thoroughly. Avoid chewing gum, smoking, and drinking from a straw. This will help decrease the amount of air you swallow, which may help reduce gas. Eating yogurt or drinking buttermilk may help reduce gas.  To control gas at night, do not eat after 8 p.m. This will give your bowel time to quiet down before you go   to bed.  If gas is a problem, you can purchase  Beano. Sprinkle Beano on the first bite of food before eating to reduce gas. It has no flavor and should not change the taste of your food. You can buy Beano over the counter at your local drugstore.  Foods like fish, onions, garlic, broccoli, asparagus, and cabbage produce odor. Although your pouch is odor-proof, if you eat these foods you may notice a stronger odor when emptying your pouch. If this is a concern, you may want to limit these foods in your diet.  If you have an ileostomy, you will have chronic diarrhea & need to drink more liquids to avoid getting dehydrated.  Consider antidiarrheal medicine like imodium (loperamide) or Lomotil to help slow down bowel movements / diarrhea into your ileostomy bag.  GETTING TO GOOD BOWEL HEALTH WITH AN ILEOSTOMY    With the colon bypassed & not in use, you will have small bowel diarrhea.   It is important to thicken & slow your bowel movements down.   The goal: 4-6 small BOWEL MOVEMENTS A DAY It is important to drink plenty of liquids to avoid getting dehydrated  CONTROLLING ILEOSTOMY DIARRHEA  TAKE A FIBER SUPPLEMENT (FiberCon or Benefiner soluble fiber) twice a day - to thicken stools by absorbing excess fluid and retrain the intestines to act more normally.  Slowly increase the dose over a few weeks.  Too much fiber too soon can backfire and cause cramping & bloating.  TAKE AN IRON SUPPLEMENT twice a day to naturally constipate your bowels.  Usually ferrous sulfate 325mg twice a day)  TAKE ANTI-DIARRHEAL MEDICINES: Loperamide (Imodium) can slow down diarrhea.  Start with two tablets (= 4mg) first and then try one tablet every 6 hours.  Can go up to 2 pills four times day (8 pills of 2mg max) Avoid if you are having fevers or severe pain.  If you are not better or start feeling worse, stop all medicines and call your doctor for advice LoMotil (Diphenoxylate / Atropine) is another medicine that can constipate & slow down bowel moevements Pepto  Bismol (bismuth) can gently thicken bowels as well  If diarrhea is worse,: drink plenty of liquids and try simpler foods for a few days to avoid stressing your intestines further. Avoid dairy products (especially milk & ice cream) for a short time.  The intestines often can lose the ability to digest lactose when stressed. Avoid foods that cause gassiness or bloating.  Typical foods include beans and other legumes, cabbage, broccoli, and dairy foods.  Every person has some sensitivity to other foods, so listen to our body and avoid those foods that trigger problems for you.Call your doctor if you are getting worse or not better.  Sometimes further testing (cultures, endoscopy, X-ray studies, bloodwork, etc) may be needed to help diagnose and treat the cause of the diarrhea. Take extra anti-diarrheal medicines (maximum is 8 pills of 2mg loperamide a day)   Tips for POUCHING an OSTOMY   Changing Your Pouch The best time to change your pouch is in the morning, before eating or drinking anything. Your stoma can function at any time, but it will function more after eating or drinking.   Applying the pouching system  Place all your equipment close at hand before removing your pouch.  Wash your hands.  Stand or sit in front of a mirror. Use the position that works best for you. Remember that you must keep the skin around the stoma   wrinkle-free for a good seal.  Gently remove the used pouch (1-piece system) or the pouch and old wafer (2-piece system). Empty the pouch into the toilet. Save the closure clip to use again.  Wash the stoma itself and the skin around the stoma. Your stoma may bleed a little when being washed. This is normal. Rinse and pat dry. You may use a wash cloth or soft paper towels (like Bounty), mild soap (like Dial, Safeguard, or Ivory), and water. Avoid soaps that contain perfumes or lotions.  For a new pouch (1-piece system) or a new wafer (2-piece system), measure your  stoma using the stoma guide in each box of supplies.  Trace the shape of your stoma onto the back of the new pouch or the back of the new wafer. Cut out the opening. Remove the paper backing and set it aside.  Optional: Apply a skin barrier powder to surrounding skin if it is irritated (bare or weeping), and dust off the excess. Optional: Apply a skin-prep wipe (such as Skin Prep or All-Kare) to the skin around the stoma, and let it dry. Do not apply this solution if the skin is irritated (red, tender, or broken) or if you have shaved around the stoma. Optional: Apply a skin barrier paste (such as Stomahesive, Coloplast, or Premium) around the opening cut in the back of the pouch or wafer. Allow it to dry for 30 to 60 seconds.  Hold the pouch (1-piece system) or wafer (2-piece system) with the sticky side toward your body. Make sure the skin around the stoma is wrinkle-free. Center the opening on the stoma, then press firmly to your abdomen (Fig. 4). Look in the mirror to check if you are placing the pouch, or wafer, in the right position. For a 2-piece system, snap the pouch onto the wafer. Make sure it snaps into place securely.  Place your hand over the stoma and the pouch or wafer for about 30 seconds. The heat from your hand can help the pouch or wafer stick to your skin.  Add deodorant (such as Super Banish or Nullo) to your pouch. Other options include food extracts such as vanilla oil and peppermint extract. Add about 10 drops of the deodorant to the pouch. Then apply the closure clamp. Note: Do not use toxic  chemicals or commercial cleaning agents in your pouch. These substances may harm the stoma.  Optional: For extra seal, apply tape to all 4 sides around the pouch or wafer, as if you were framing a picture. You may use any brand of medical adhesive tape. Change your pouch every 5 to 7 days. Change it immediately if a leak occurs.  Wash your hands afterwards.  If you are wearing a  2-piece system, you may use 2 new pouches per week and alternate them. Rinse the pouch with mild soap and warm water and hang it to dry for the next day. Apply the fresh pouch. Alternate the 2 pouches like this for a week. After a week, change the wafer and begin with 2 new pouches. Place the old pouches in a plastic bag, and put them in the trash.   LIVING WITH AN OSTOMY  Emptying Your Pouch Empty your pouch when it is one-third full (of urine, stool, and/or gas). If you wait until your pouch is fuller than this, it will be more difficult to empty and more noticeable. When you empty your pouch, either put toilet paper in the toilet bowl first, or flush the   toilet while you empty the pouch. This will reduce splashing. You can empty the pouch between your legs or to one side while sitting, or while standing or stooping. If you have a 2-piece system, you can snap off the pouch to empty it. Remember that your stoma may function during this time. If you wish to rinse your pouch after you empty it, a turkey baster can be helpful. When using a baster, squirt water up into the pouch through the opening at the bottom. With a 2-piece system, you can snap off the pouch to rinse it. After rinsing  your pouch, empty it into the toilet. When rinsing your pouch at home, put a few granules of Dreft soap in the rinse water. This helps lubricate and freshen your pouch. The inside of your pouch can be sprayed with non-stick cooking oil (Pam spray). This may help reduce stool sticking to the inside of the pouch.  Bathing You may shower or bathe with your pouch on or off. Remember that your stoma may function during this time.  The materials you use to wash your stoma and the skin around it should be clean, but they do not need to be sterile.  Wearing Your Pouch During hot weather, or if you perspire a lot in general, wear a cover over your pouch. This may prevent a rash on your skin under the pouch. Pouch covers are  sold at ostomy supply stores. Wear the pouch inside your underwear for better support. Watch your weight. Any gain or loss of 10 to 15 pounds or more can change the way your pouch fits.  Going Away From Home A collapsible cup (like those that come in travel kits) or a soft plastic squirt bottle with a pull-up top (like a travel bottle for shampoo) can be used for rinsing your pouch when you are away from home. Tilt the opening of the pouch at an upward angle when using a cup to rinse.  Carry wet wipes or extra tissues to use in public bathrooms.  Carry an extra pouching system with you at all times.  Never keep ostomy supplies in the glove compartment of your car. Extreme heat or cold can damage the skin barriers and adhesive wafers on the pouch.  When you travel, carry your ostomy supplies with you at all times. Keep them within easy reach. Do not pack ostomy supplies in baggage that will be checked or otherwise separated from you, because your baggage might be lost. If you're traveling out of the country, it is helpful to have a letter stating that you are carrying ostomy supplies as a medical necessity.  If you need ostomy supplies while traveling, look in the yellow pages of the telephone book under "Surgical Supplies." Or call the local ostomy organization to find out where supplies are available.  Do not let your ostomy supplies get low. Always order new pouches before you use the last one.  Reducing Odor Limit foods such as broccoli, cabbage, onions, fish, and garlic in your diet to help reduce odor. Each time you empty your pouch, carefully clean the opening of the pouch, both inside and outside, with toilet paper. Rinse your pouch 1 or 2 times daily after you empty it (see directions for emptying your pouch and going away from home). Add deodorant (such as Super Banish or Nullo) to your pouch. Use air deodorizers in your bathroom. Do not add aspirin to your pouch. Even though  aspirin can help prevent odor, it   could cause ulcers on your stoma.  When to call the doctor Call the doctor if you have any of the following symptoms: Purple, black, or white stoma Severe cramps lasting more than 6 hours Severe watery discharge from the stoma lasting more than 6 hours No output from the colostomy for 3 days Excessive bleeding from your stoma Swelling of your stoma to more than 1/2-inch larger than usual Pulling inward of your stoma below skin level Severe skin irritation or deep ulcers Bulging or other changes in your abdomen  When to call your ostomy nurse Call your ostomy/enterostomal therapy (WOCN) nurse if any of the following occurs: Frequent leaking of your pouching system Change in size or appearance of your stoma, causing discomfort or problems with your pouch Skin rash or rawness Weight gain or loss that causes problems with your pouch     FREQUENTLY ASKED QUESTIONS   Why haven't you met any of these folks who have an ostomy?  Well, maybe you have! You just did not recognize them because an ostomy doesn't show. It can be kept secret if you wish. Why, maybe some of your best friends, office associates or neighbors have an ostomy ... you never can tell. People facing ostomy surgery have many quality-of-life questions like: Will you bulge? Smell? Make noises? Will you feel waste leaving your body? Will you be a captive of the toilet? Will you starve? Be a social outcast? Get/stay married? Have babies? Easily bathe, go swimming, bend over?  OK, let's look at what you can expect:   Will you bulge?  Remember, without part of the intestine or bladder, and its contents, you should have a flatter tummy than before. You can expect to wear, with little exception, what you wore before surgery ... and this in-cludes tight clothing and bathing suits.   Will you smell?  Today, thanks to modern odor proof pouching systems, you can walk into an ostomy support group  meeting and not smell anything that is foul or offensive. And, for those with an ileostomy or colostomy who are concerned about odor when emptying their pouch, there are in-pouch deodorants that can be used to eliminate any waste odors that may exist.   Will you make noises?  Everyone produces gas, especially if they are an air-swallower. But intestinal sounds that occur from time to time are no differ-ent than a gurgling tummy, and quite often your clothing will muffle any sounds.   Will you feel the waste discharges?  For those with a colostomy or ileostomy there might be a slight pressure when waste leaves your body, but understand that the intestines have no nerve endings, so there will be no unpleasant sensations. Those with a urostomy will probably be unaware of any kidney drainage.   Will you be a captive of the toilet?  Immediately post-op you will spend more time in the bathroom than you will after your body recovers from surgery. Every person is different, but on average those with an ileostomy or urostomy may empty their pouches 4 to 6 times a day; a little  less if you have a colostomy. The average wear time between pouch system changes is 3 to 5 days and the changing process should take less than 30 minutes.   Will I need to be on a special diet? Most people return to their normal diet when they have recovered from surgery. Be sure to chew your food well, eat a well-balanced diet and drink plenty of fluids. If   you experience problems with a certain food, wait a couple of weeks and try it again.  Will there be odor and noises? Pouching systems are designed to be odor-proof or odor-resistant. There are deodorants that can be used in the pouch. Medications are also available to help reduce odor. Limit gas-producing foods and carbonated beverages. You will experience less gas and fewer noises as you heal from surgery.  How much time will it take to care for my ostomy? At first, you may  spend a lot of time learning about your ostomy and how to take care of it. As you become more comfortable and skilled at changing the pouching system, it will take very little time to care for it.   Will I be able to return to work? People with ostomies can perform most jobs. As soon as you have healed from surgery, you should be able to return to work. Heavy lifting (more than 10 pounds) may be discouraged.   What about intimacy? Sexual relationships and intimacy are important and fulfilling aspects of your life. They should continue after ostomy surgery. Intimacy-related concerns should be discussed openly between you and your partner.   Can I wear regular clothing? You do not need to wear special clothing. Ostomy pouches are fairly flat and barely noticeable. Elastic undergarments will not hurt the stoma or prevent the ostomy from functioning.   Can I participate in sports? An ostomy should not limit your involvement in sports. Many people with ostomies are runners, skiers, swimmers or participate in other active lifestyles. Talk with your caregiver first before doing heavy physical activity.  Will you starve?  Not if you follow doctor's orders at each stage of your post-op adjustment. There is no such thing as an "ostomy diet". Some people with an ostomy will be able to eat and tolerate anything; others may find diffi-culty with some foods. Each person is an individual and must determine, by trial, what is best for them. A good practice for all is to drink plenty of water.   Will you be a social outcast?  Have you met anyone who has an ostomy and is a social outcast? Why should you be the first? Only your attitude and self image will effect how you are treated. No confi-dent person is an outcast.    PROFESSIONAL HELP   Resources are available if you need help or have questions about your ostomy.   Specially trained nurses called Wound, Ostomy Continence Nurses (WOCN) are available for  consultation in most major medical centers.  Consider getting an ostomy consult at an outpatient ostomy clinic.   [place] has an Ostomy Clinic run by an WOCN ostomy nurse at the [facility].  [phone]. [facility] can help set up an appointment   The United Ostomy Association (UOA) is a group made up of many local chapters throughout the [place]. These local groups hold meetings and provide support to prospective and existing ostomates. They sponsor educational events and have qualified visitors to make personal or telephone visits. Contact the UOA for the chapter nearest you and for other educational publications.  More detailed information can be found in Colostomy Guide, a publication of the United Ostomy Association (UOA). Contact UOA at 1-800-826-0826 or visit their web site at www.uoaa.org. The website contains links to other sites, suppliers and resources.  [facility] Secure Start Services: Start at the website to enlist for support.  Your Wound Ostomy (WOCN) nurse may have started this   process. [email or web address]://[email or web address]/en/securestart Secure Start services are designed to support people as they live their lives with an ostomy or neurogenic bladder. Enrolling is easy and at no cost to the patient. We realize that each person's needs and life journey are different. Through Secure Start services, we want to help people live their life, their way.  #######################################################  

## 2021-07-01 NOTE — Progress Notes (Signed)
6 Days Post-Op Robotic ileocecectomy Subjective: Pt c/o less pain, Not eating much, having BM's, not ambulating in the hall  Objective: Vital signs in last 24 hours: Temp:  [98.9 F (37.2 C)-99.1 F (37.3 C)] 99.1 F (37.3 C) (12/21 0618) Pulse Rate:  [83-95] 87 (12/21 0618) Resp:  [14-18] 14 (12/21 0618) BP: (111-122)/(81-88) 117/81 (12/21 0618) SpO2:  [100 %] 100 % (12/21 0618)   Intake/Output from previous day: 12/20 0701 - 12/21 0700 In: 1360 [P.O.:1360] Out: 1 [Stool:1] Intake/Output this shift: No intake/output data recorded.   General appearance: cooperative and fatigued GI: normal findings: soft, non-distended  Incision: no significant drainage  Lab Results:  No results for input(s): WBC, HGB, HCT, PLT in the last 72 hours.  BMET No results for input(s): NA, K, CL, CO2, GLUCOSE, BUN, CREATININE, CALCIUM in the last 72 hours.  PT/INR No results for input(s): LABPROT, INR in the last 72 hours. ABG No results for input(s): PHART, HCO3 in the last 72 hours.  Invalid input(s): PCO2, PO2  MEDS, Scheduled  docusate sodium  100 mg Oral BID   enoxaparin (LOVENOX) injection  40 mg Subcutaneous Q24H   famotidine  20 mg Oral BID   feeding supplement  237 mL Oral BID BM   gabapentin  300 mg Oral BID   lip balm  1 application Topical BID   pantoprazole  80 mg Oral Daily   saccharomyces boulardii  250 mg Oral BID   sucralfate  1 g Oral TID WC & HS    Studies/Results: No results found.  Assessment: s/p Procedure(s): ROBOTIC ILEOCECECTOMY Patient Active Problem List   Diagnosis Date Noted   Endometriosis 06/25/2021   Anemia 12/18/2020   Intractable nausea and vomiting 07/27/2019   Hypocalcemia 07/27/2019   Dehydration 07/27/2019   Vitamin D deficiency 09/09/2014   Dental cavities 10/09/2013   Abdominal bloating 10/09/2013   Right shoulder pain 10/09/2013   Endometriosis of pelvic peritoneum 01/09/2011   Other and unspecified ovarian cyst 01/09/2011    Cocaine abuse, unspecified 01/09/2011    Expected post op course, post op ileus resolving  Plan: Cont diet Ambulate in hall D/c tom   LOS: 6 days     .Vanita Panda, MD [facility], [place]    07/01/2021 8:46 AM

## 2021-07-02 MED ORDER — DOCUSATE SODIUM 100 MG PO CAPS: 100.0000 mg | ORAL_CAPSULE | Freq: Two times a day (BID) | ORAL | 0 refills | Status: AC | PRN

## 2021-07-03 NOTE — Discharge Summary (Signed)
Patient ID: Jane [name] [id number] 35 y.o. Nov 27, 1985  06/25/2021  Discharge date and time: 07/02/2021 12:05 PM  Admitting Physician: Vanita Panda  Discharge Physician: Vanita Panda  Admission Diagnoses: Endometriosis [N80.9]  Discharge Diagnoses: same  Operations: Procedure(s): ROBOTIC ILEOCECECTOMY    Discharged Condition: good    Hospital Course: Patient was admitted to the med surg floor after surgery.  Diet was advanced as tolerated.  Patient began to have bowel function on postop day 4.  By postop day 7, She was tolerating a solid diet and pain was controlled with oral medications.  She was urinating without difficulty and ambulating without assistance.  Patient was felt to be in stable condition for discharge to home.   Consults: None  Significant Diagnostic Studies: labs: cbc, bmet  Treatments: IV hydration, analgesia: oxycodone, and surgery: robotic ileocecectomy   Disposition: Home

## 2021-07-22 DIAGNOSIS — R0602 Shortness of breath: Secondary | ICD-10-CM | POA: Diagnosis not present

## 2021-07-22 DIAGNOSIS — Z888 Allergy status to other drugs, medicaments and biological substances status: Secondary | ICD-10-CM | POA: Diagnosis not present

## 2021-07-22 DIAGNOSIS — Z79899 Other long term (current) drug therapy: Secondary | ICD-10-CM | POA: Diagnosis not present

## 2021-07-22 DIAGNOSIS — I2693 Single subsegmental pulmonary embolism without acute cor pulmonale: Secondary | ICD-10-CM | POA: Diagnosis not present

## 2021-07-22 DIAGNOSIS — R7989 Other specified abnormal findings of blood chemistry: Secondary | ICD-10-CM | POA: Diagnosis not present

## 2021-07-22 DIAGNOSIS — R002 Palpitations: Secondary | ICD-10-CM | POA: Diagnosis not present

## 2021-07-22 DIAGNOSIS — I2699 Other pulmonary embolism without acute cor pulmonale: Secondary | ICD-10-CM | POA: Diagnosis not present

## 2021-07-22 DIAGNOSIS — Z881 Allergy status to other antibiotic agents status: Secondary | ICD-10-CM | POA: Diagnosis not present

## 2021-07-22 DIAGNOSIS — R42 Dizziness and giddiness: Secondary | ICD-10-CM | POA: Diagnosis not present

## 2021-07-22 DIAGNOSIS — R079 Chest pain, unspecified: Secondary | ICD-10-CM | POA: Diagnosis not present

## 2021-07-22 DIAGNOSIS — N809 Endometriosis, unspecified: Secondary | ICD-10-CM | POA: Diagnosis not present

## 2021-07-23 DIAGNOSIS — M79602 Pain in left arm: Secondary | ICD-10-CM | POA: Diagnosis not present

## 2021-07-23 DIAGNOSIS — R002 Palpitations: Secondary | ICD-10-CM | POA: Diagnosis not present

## 2021-07-23 DIAGNOSIS — I2699 Other pulmonary embolism without acute cor pulmonale: Secondary | ICD-10-CM | POA: Diagnosis not present

## 2021-07-24 DIAGNOSIS — I2699 Other pulmonary embolism without acute cor pulmonale: Secondary | ICD-10-CM | POA: Diagnosis not present

## 2021-08-18 DIAGNOSIS — Z793 Long term (current) use of hormonal contraceptives: Secondary | ICD-10-CM | POA: Diagnosis not present

## 2021-08-18 DIAGNOSIS — N803 Endometriosis of pelvic peritoneum, unspecified: Secondary | ICD-10-CM | POA: Diagnosis not present

## 2021-08-18 DIAGNOSIS — Z3042 Encounter for surveillance of injectable contraceptive: Secondary | ICD-10-CM | POA: Diagnosis not present

## 2021-08-18 DIAGNOSIS — I2699 Other pulmonary embolism without acute cor pulmonale: Secondary | ICD-10-CM | POA: Diagnosis not present

## 2021-08-25 ENCOUNTER — Telehealth: Payer: Self-pay | Admitting: Hematology

## 2021-08-25 DIAGNOSIS — I071 Rheumatic tricuspid insufficiency: Secondary | ICD-10-CM | POA: Diagnosis not present

## 2021-08-25 NOTE — Telephone Encounter (Signed)
Scheduled appt 2/9 referral. Pt is aware of appt date and time. Pt is aware to arrive 15 mins prior to appt.

## 2021-09-01 DIAGNOSIS — Z886 Allergy status to analgesic agent status: Secondary | ICD-10-CM | POA: Diagnosis not present

## 2021-09-01 DIAGNOSIS — K219 Gastro-esophageal reflux disease without esophagitis: Secondary | ICD-10-CM | POA: Diagnosis not present

## 2021-09-01 DIAGNOSIS — K449 Diaphragmatic hernia without obstruction or gangrene: Secondary | ICD-10-CM | POA: Diagnosis not present

## 2021-09-01 DIAGNOSIS — K6389 Other specified diseases of intestine: Secondary | ICD-10-CM | POA: Diagnosis not present

## 2021-09-01 DIAGNOSIS — K5900 Constipation, unspecified: Secondary | ICD-10-CM | POA: Diagnosis not present

## 2021-09-01 DIAGNOSIS — Z20822 Contact with and (suspected) exposure to covid-19: Secondary | ICD-10-CM | POA: Diagnosis not present

## 2021-09-01 DIAGNOSIS — K921 Melena: Secondary | ICD-10-CM | POA: Diagnosis not present

## 2021-09-01 DIAGNOSIS — Z881 Allergy status to other antibiotic agents status: Secondary | ICD-10-CM | POA: Diagnosis not present

## 2021-09-01 DIAGNOSIS — Z9889 Other specified postprocedural states: Secondary | ICD-10-CM | POA: Diagnosis not present

## 2021-09-01 DIAGNOSIS — R1032 Left lower quadrant pain: Secondary | ICD-10-CM | POA: Diagnosis not present

## 2021-09-01 DIAGNOSIS — Z888 Allergy status to other drugs, medicaments and biological substances status: Secondary | ICD-10-CM | POA: Diagnosis not present

## 2021-09-01 DIAGNOSIS — K625 Hemorrhage of anus and rectum: Secondary | ICD-10-CM | POA: Diagnosis not present

## 2021-09-01 DIAGNOSIS — N809 Endometriosis, unspecified: Secondary | ICD-10-CM | POA: Diagnosis not present

## 2021-09-01 DIAGNOSIS — Z79899 Other long term (current) drug therapy: Secondary | ICD-10-CM | POA: Diagnosis not present

## 2021-09-01 DIAGNOSIS — K6289 Other specified diseases of anus and rectum: Secondary | ICD-10-CM | POA: Diagnosis not present

## 2021-09-01 DIAGNOSIS — Z885 Allergy status to narcotic agent status: Secondary | ICD-10-CM | POA: Diagnosis not present

## 2021-09-02 DIAGNOSIS — K6389 Other specified diseases of intestine: Secondary | ICD-10-CM | POA: Diagnosis not present

## 2021-09-02 DIAGNOSIS — K6289 Other specified diseases of anus and rectum: Secondary | ICD-10-CM | POA: Diagnosis not present

## 2021-09-02 DIAGNOSIS — K449 Diaphragmatic hernia without obstruction or gangrene: Secondary | ICD-10-CM | POA: Diagnosis not present

## 2021-09-02 DIAGNOSIS — K219 Gastro-esophageal reflux disease without esophagitis: Secondary | ICD-10-CM | POA: Diagnosis not present

## 2021-09-07 ENCOUNTER — Encounter: Payer: Self-pay | Admitting: Nurse Practitioner

## 2021-09-07 ENCOUNTER — Ambulatory Visit: Payer: BC Managed Care – PPO | Admitting: Nurse Practitioner

## 2021-09-07 VITALS — BP 104/70 | HR 91 | Ht 66.75 in | Wt 138.2 lb

## 2021-09-07 DIAGNOSIS — R58 Hemorrhage, not elsewhere classified: Secondary | ICD-10-CM

## 2021-09-07 DIAGNOSIS — K625 Hemorrhage of anus and rectum: Secondary | ICD-10-CM

## 2021-09-07 DIAGNOSIS — K5900 Constipation, unspecified: Secondary | ICD-10-CM | POA: Diagnosis not present

## 2021-09-07 MED ORDER — HYDROCORTISONE (PERIANAL) 2.5 % EX CREA: 1.0000 "application " | TOPICAL_CREAM | Freq: Every evening | CUTANEOUS | 1 refills | Status: AC | PRN

## 2021-09-07 NOTE — Progress Notes (Signed)
ASSESSMENT AND PLAN    36 yo female with history of endometriosis s/p ileocecectomy in December 2022.   # Intermittent, overall painless rectal bleeding over last several days on Eliquis and in setting of constipation. No further bleeding with most recent BM. Seen at [facility] ED on 2/21 and had brown, heme+ stool on exam. Hgb 12.5. On exam today there are small internal hemorrhoids on anoscopy which are likely source of bleeding --Last colonoscopy Aug 2020 (done for abnormal CT scan showing soft tissue density involving cecum) without polyps or other lesions. Mild inflammation in terminal ileum turned out to be endometriosis. She underwent ileocecectomy in December. --Anusol cream PR Q HS x 10 days --Increase water intake to 60 oz / day --Continue high fiber diet --Trial of Miralax 1 capful daily in 8 oz water --Can still use Dulcolax if needed --Call in 2 weeks with condition update. Right now the minor bleeding has stopped. Recurrent bleeding will require further evaluation and treatment, especially given need for Eliquis  # Possible enteritis on CT scan 09/02/21 at [facility] ED done to evaluate abdominal discomfort and rectal bleeding. Abdominal discomfort was transient. She hasn't had any nausea / vomiting.  WBC was normal. Clinically she seems to be doing fine. --Not sure what to make of small bowel findings. I do not suspect she will need any additional workup if continues to feel okay but will ask Dr. Tarri Glenn to refill CT scan.    HISTORY OF PRESENT ILLNESS    Chief Complaint : ED follow up  Jane [name] is a 36 y.o. female with a past medical history of GERD with LPR, endometriosis s/p ileocecectomy.  Additional medical history as listed in [place] .   Patient is known to Dr. Tarri Glenn. Last seen May 2022.  She is s/p ileocecectomy for endometriosis involving terminal ileum December 2022. Subsequently she was diagnosed with a PE and is on Eliquis  Jane [name] has tended  towards constipation with decreased frequency and hard stools since bowel surgery in December. She eats a high fiber diet, drinks about 3-4 bottles of water daily.   She is here today after ED visit on 2/21 ( [facility] ) for one episode of rectal bleeding with minor abdominal cramping.  In ED her WBC was 5.5, hgb 12.5 ( baseline 11.3). CTAP >> non dilated fluid filled small bowel loops with mild mucosal hyperemia. No evidence for colitis. On rectal exam there was light brown stool, Heme +. She was released from ED. She has since had a few more episode of rectal bleeding but with smaller amount of blood. Her most recent bowel movement did not contain blood.     PREVIOUS IMAGING   09/02/21 CTAP w/contrast - [facility]  1.  Nondilated fluid-filled small bowel loops with mild mucosal hyperemia may represent enteritis. No bowel obstruction.  2.  Small hiatal hernia with reflux.     PREVIOUS ENDOSCOPIC EVALUATIONS    Colonoscopy August 2020 -The entire examined colon is normal. - The appendiceal orifice is normal. Biopsied. - Erythematous mucosa in the distal ileum. Biopsied. - The examination was otherwise normal on direct and retroflexion views  A. TERMINAL ILEUM, CECUM, ILEOCECECTOMY:  -  Endometriosis involving terminal ileum, cecum and appendix  -  Viable margins  -  No malignancy identified    EGD July 2022 Normal esophagus. - Erythematous mucosa in the gastric body. Biopsied. - Erythematous duodenopathy. Biopsied. - The examination was otherwise normal.  Surgical [P], duodenum - BENIGN DUODENAL  MUCOSA WITH FOCAL INCREASED INTRAEPITHELIAL LYMPHOCYTES - NO ACUTE INFLAMMATION OR VILLOUS BLUNTING IDENTIFIED - SEE COMMENT 2. Surgical [P], gastric antrum - MILD CHRONIC GASTRITIS WITHOUT ACTIVITY - NO H. PYLORI OR INTESTINAL METAPLASIA IDENTIFIED - SEE COMMENT 3. Surgical [P], gastric body - BENIGN GASTRIC MUCOSA WITH MILD REACTIVE CHANGES - NO H. PYLORI, INTESTINAL METAPLASIA OR  MALIGNANCY IDENTIFIED 4. Surgical [P], gastric fundus - BENIGN GASTRIC MUCOSA - NO H. PYLORI, INTESTINAL METAPLASIA OR MALIGNANCY IDENTIFIED    Past Medical History:  Diagnosis Date   Anemia    childhood   Anxiety    Bladder infection    Endometriosis    GERD (gastroesophageal reflux disease)    Migraine    UTI (lower urinary tract infection)     Past Surgical History:  Procedure Laterality Date   COLONOSCOPY     2020   ESOPHAGOGASTRODUODENOSCOPY ENDOSCOPY     LAPAROSCOPIC ILEOCECECTOMY N/A 06/25/2021   Procedure: ROBOTIC ILEOCECECTOMY;  Surgeon: Leighton Ruff, MD;  Location: [facility];  Service: General;  Laterality: N/A;   LAPAROSCOPY N/A 03/20/2021   Procedure: DIAGNOSTIC LAPAROSCOPY AND BIOPSY;  Surgeon: Leighton Ruff, MD;  Location: [facility];  Service: General;  Laterality: N/A;   UMBILICAL HERNIA REPAIR      Current Medications, Allergies, Family History and Social History were reviewed in [facility] record.     Current Outpatient Medications  Medication Sig Dispense Refill   acetaminophen (TYLENOL) 500 MG tablet Take 1,000 mg by mouth every 6 (six) hours as needed for moderate pain.     Cholecalciferol (VITAMIN D) 125 MCG (5000 UT) CAPS Take 1 capsule by mouth daily. 30 capsule 0   docusate sodium (COLACE) 100 MG capsule Take 1 capsule (100 mg total) by mouth 2 (two) times daily as needed for mild constipation. 10 capsule 0   ELIQUIS 5 MG TABS tablet Take 5 mg by mouth 2 (two) times daily.     famotidine (PEPCID) 20 MG tablet Take 1 tablet (20 mg total) by mouth 2 (two) times daily. 60 tablet 2   medroxyPROGESTERone Acetate 150 MG/ML SUSY Inject 150 mg into the muscle every 3 (three) months.     ondansetron (ZOFRAN) 4 MG tablet Take 1 tablet by mouth every 6 hours as needed for nausea. 20 tablet 0   Current Facility-Administered Medications  Medication Dose Route Frequency Provider Last Rate Last Admin   0.9 %  sodium chloride infusion  500 mL  Intravenous Once [place], MD        Review of Systems: No chest pain. No shortness of breath. No urinary complaints.   PHYSICAL EXAM :    Wt Readings from Last 3 Encounters:  09/07/21 138 lb 4 oz (62.7 kg)  06/30/21 143 lb 14.4 oz (65.3 kg)  06/22/21 134 lb (60.8 kg)    BP 104/70    Pulse 91    Ht 5' 6.75" (1.695 m)    Wt 138 lb 4 oz (62.7 kg)    BMI 21.82 kg/m  Constitutional:  Generally well appearing female in no acute distress. Psychiatric: Pleasant. Normal mood and affect. Behavior is normal. EENT: Pupils normal.  Conjunctivae are normal. No scleral icterus. Neck supple.  Cardiovascular: Normal rate, regular rhythm. No edema Pulmonary/chest: Effort normal and breath sounds normal. No wheezing, rales or rhonchi. Abdominal: Soft, nondistended, nontender. Bowel sounds active throughout. There are no masses palpable. No hepatomegaly. Rectal: no external lesions. No stool or blood in vault. Small internal hemorrhoids on anoscopy  Neurological:  Alert and oriented to person place and time. Skin: Skin is warm and dry. No rashes noted.  Tye Savoy, NP  09/07/2021, 11:24 AM

## 2021-09-07 NOTE — Patient Instructions (Addendum)
°--  Anusol cream inside rectum every night for 10 days --Increase water intake to 60 oz / day --Continue high fiber diet --Trial of Miralax 1 capful daily in 8 oz water --can still use Dulcolax tablets if needed --Call us in 2 weeks with an update. We will need to know if the rectal bleeding stopped or not   We have sent Anusol cream to your pharmacy  If you are age 38 or older, your body mass index should be between 23-30. Your Body mass index is 21.82 kg/m. If this is out of the aforementioned range listed, please consider follow up with your Primary Care Provider.  If you are age 75 or younger, your body mass index should be between 19-25. Your Body mass index is 21.82 kg/m. If this is out of the aformentioned range listed, please consider follow up with your Primary Care Provider.   ________________________________________________________  The [facility] providers would like to encourage you to use [facility] to communicate with providers for non-urgent requests or questions.  Due to long hold times on the telephone, sending your provider a message by [facility] may be a faster and more efficient way to get a response.  Please allow 48 business hours for a response.  Please remember that this is for non-urgent requests.  _______________________________________________________   I appreciate the  opportunity to care for you  Thank You   Midge Minium

## 2021-09-08 NOTE — Progress Notes (Signed)
Reviewed and agree with management plans. Electronic records since her last office visit with me were reviewed including surgical notes, pathology results, and recent CT scan. Agree with recommendations per Tye Savoy. Plan repeat CT scan if symptoms recur. No additional work-up at this time.   Mercede Rollo L. Tarri Glenn, MD, MPH

## 2021-09-10 ENCOUNTER — Telehealth: Payer: Self-pay

## 2021-09-10 NOTE — Telephone Encounter (Signed)
Called patient. Shared this information with the patient. No concerns at this time. ?

## 2021-09-10 NOTE — Telephone Encounter (Signed)
-----   Message from Meredith Pel, NP sent at 09/09/2021  4:48 PM EST ----- ?Beth, please let Belkys know that Dr. Orvan Falconer did look at her CT scan from the ED.  She agrees that right now we do not need to do any further work-up.  Findings seem nonspecific at this time.  If she has any abdominal pain or further rectal bleeding then she needs to get back in touch with [place].  Thank you ? ?

## 2021-09-14 ENCOUNTER — Inpatient Hospital Stay: Payer: BC Managed Care – PPO | Admitting: Hematology

## 2021-09-14 ENCOUNTER — Telehealth: Payer: Self-pay | Admitting: Hematology

## 2021-09-14 NOTE — Telephone Encounter (Signed)
Cancelled per 3/6 pt request, pt did not want to r/s at this time, I told pt to call back when they want to r/s ?

## 2021-09-17 DIAGNOSIS — I2699 Other pulmonary embolism without acute cor pulmonale: Secondary | ICD-10-CM | POA: Diagnosis not present

## 2021-09-17 DIAGNOSIS — N803 Endometriosis of pelvic peritoneum, unspecified: Secondary | ICD-10-CM | POA: Diagnosis not present

## 2021-09-17 DIAGNOSIS — M810 Age-related osteoporosis without current pathological fracture: Secondary | ICD-10-CM | POA: Diagnosis not present

## 2021-09-17 DIAGNOSIS — Z793 Long term (current) use of hormonal contraceptives: Secondary | ICD-10-CM | POA: Diagnosis not present

## 2021-09-17 DIAGNOSIS — Z13820 Encounter for screening for osteoporosis: Secondary | ICD-10-CM | POA: Diagnosis not present

## 2021-09-29 DIAGNOSIS — N803 Endometriosis of pelvic peritoneum, unspecified: Secondary | ICD-10-CM | POA: Diagnosis not present

## 2021-09-30 DIAGNOSIS — Z113 Encounter for screening for infections with a predominantly sexual mode of transmission: Secondary | ICD-10-CM | POA: Diagnosis not present

## 2021-09-30 DIAGNOSIS — R102 Pelvic and perineal pain: Secondary | ICD-10-CM | POA: Diagnosis not present

## 2021-09-30 DIAGNOSIS — Z30430 Encounter for insertion of intrauterine contraceptive device: Secondary | ICD-10-CM | POA: Diagnosis not present

## 2021-09-30 DIAGNOSIS — N803 Endometriosis of pelvic peritoneum, unspecified: Secondary | ICD-10-CM | POA: Diagnosis not present

## 2021-09-30 DIAGNOSIS — Z30432 Encounter for removal of intrauterine contraceptive device: Secondary | ICD-10-CM | POA: Diagnosis not present

## 2021-11-11 DIAGNOSIS — N803 Endometriosis of pelvic peritoneum, unspecified: Secondary | ICD-10-CM | POA: Diagnosis not present

## 2021-11-11 DIAGNOSIS — Z113 Encounter for screening for infections with a predominantly sexual mode of transmission: Secondary | ICD-10-CM | POA: Diagnosis not present

## 2021-11-11 DIAGNOSIS — Z139 Encounter for screening, unspecified: Secondary | ICD-10-CM | POA: Diagnosis not present

## 2021-11-11 DIAGNOSIS — N898 Other specified noninflammatory disorders of vagina: Secondary | ICD-10-CM | POA: Diagnosis not present

## 2021-11-11 DIAGNOSIS — E559 Vitamin D deficiency, unspecified: Secondary | ICD-10-CM | POA: Diagnosis not present

## 2021-11-11 DIAGNOSIS — Z3141 Encounter for fertility testing: Secondary | ICD-10-CM | POA: Diagnosis not present

## 2021-12-04 ENCOUNTER — Ambulatory Visit: Payer: BC Managed Care – PPO | Admitting: Physician Assistant

## 2021-12-30 ENCOUNTER — Telehealth: Payer: Self-pay | Admitting: Gastroenterology

## 2021-12-30 NOTE — Telephone Encounter (Signed)
Spoke with pt and pt stated she reached out to [facility] regarding abdominal pain and they ordered a CT scan. Pt states she is waiting to be contacted to schedule CT. Pt states that she recently started having abd pain right around her navel and pain feels similar to when she had an umbilical hernia in 2005. Pt states she experiences pain with movement and can't lay on her side. Pt reports that pain is exacerbated by eating a large meal.

## 2021-12-30 NOTE — Telephone Encounter (Signed)
Patient called states she is urgently seeking advise for severe pain she is having thinks it's regarding possible hernia.

## 2021-12-31 NOTE — Telephone Encounter (Signed)
Spoke with pt and let her know Paula's message. Pt verbalized understanding and stated that CT scan hasn't been scheduled yet but she thought that the imaging place would be contacting her today to schedule.

## 2021-12-31 NOTE — Telephone Encounter (Signed)
Meredith Pel, NP    Ok, we can see what the CT scan shows. That is what we would have ordered for her. Please let me know when CT scan results available. If pain that severe then go to ED. Thanks

## 2022-01-01 ENCOUNTER — Other Ambulatory Visit: Payer: Self-pay | Admitting: General Surgery

## 2022-01-01 DIAGNOSIS — R1033 Periumbilical pain: Secondary | ICD-10-CM

## 2022-01-18 DIAGNOSIS — R0602 Shortness of breath: Secondary | ICD-10-CM | POA: Diagnosis not present

## 2022-01-18 DIAGNOSIS — R079 Chest pain, unspecified: Secondary | ICD-10-CM | POA: Diagnosis not present

## 2022-01-18 DIAGNOSIS — I2699 Other pulmonary embolism without acute cor pulmonale: Secondary | ICD-10-CM | POA: Diagnosis not present

## 2022-01-23 DIAGNOSIS — M25521 Pain in right elbow: Secondary | ICD-10-CM | POA: Diagnosis not present

## 2022-01-24 DIAGNOSIS — M7989 Other specified soft tissue disorders: Secondary | ICD-10-CM | POA: Diagnosis not present

## 2022-01-24 DIAGNOSIS — Z881 Allergy status to other antibiotic agents status: Secondary | ICD-10-CM | POA: Diagnosis not present

## 2022-01-24 DIAGNOSIS — Z885 Allergy status to narcotic agent status: Secondary | ICD-10-CM | POA: Diagnosis not present

## 2022-01-24 DIAGNOSIS — Z888 Allergy status to other drugs, medicaments and biological substances status: Secondary | ICD-10-CM | POA: Diagnosis not present

## 2022-01-24 DIAGNOSIS — Z79899 Other long term (current) drug therapy: Secondary | ICD-10-CM | POA: Diagnosis not present

## 2022-01-24 DIAGNOSIS — Z7901 Long term (current) use of anticoagulants: Secondary | ICD-10-CM | POA: Diagnosis not present

## 2022-01-24 DIAGNOSIS — Z886 Allergy status to analgesic agent status: Secondary | ICD-10-CM | POA: Diagnosis not present

## 2022-01-24 DIAGNOSIS — M25521 Pain in right elbow: Secondary | ICD-10-CM | POA: Diagnosis not present

## 2022-01-26 DIAGNOSIS — Z3141 Encounter for fertility testing: Secondary | ICD-10-CM | POA: Diagnosis not present

## 2022-01-26 DIAGNOSIS — N803 Endometriosis of pelvic peritoneum, unspecified: Secondary | ICD-10-CM | POA: Diagnosis not present

## 2022-01-29 ENCOUNTER — Ambulatory Visit
Admission: RE | Admit: 2022-01-29 | Discharge: 2022-01-29 | Disposition: A | Discharge status: Home / Self Care | Payer: BC Managed Care – PPO | Source: Ambulatory Visit | Attending: General Surgery | Admitting: General Surgery

## 2022-01-29 DIAGNOSIS — R109 Unspecified abdominal pain: Secondary | ICD-10-CM | POA: Diagnosis not present

## 2022-01-29 DIAGNOSIS — Z9049 Acquired absence of other specified parts of digestive tract: Secondary | ICD-10-CM | POA: Diagnosis not present

## 2022-01-29 DIAGNOSIS — K6389 Other specified diseases of intestine: Secondary | ICD-10-CM | POA: Diagnosis not present

## 2022-01-29 DIAGNOSIS — R1033 Periumbilical pain: Secondary | ICD-10-CM

## 2022-01-29 MED ORDER — IOPAMIDOL (ISOVUE-300) INJECTION 61%
100.0000 mL | Freq: Once | INTRAVENOUS | Status: AC | PRN
  Administered 2022-01-29: 100 mL via INTRAVENOUS

## 2022-02-16 ENCOUNTER — Telehealth: Payer: Self-pay

## 2022-02-16 DIAGNOSIS — R002 Palpitations: Secondary | ICD-10-CM | POA: Diagnosis not present

## 2022-02-16 DIAGNOSIS — Z86711 Personal history of pulmonary embolism: Secondary | ICD-10-CM | POA: Diagnosis not present

## 2022-02-16 NOTE — Telephone Encounter (Signed)
error 

## 2022-02-18 ENCOUNTER — Telehealth: Payer: Self-pay

## 2022-02-18 NOTE — Telephone Encounter (Addendum)
-----   Message from Meredith Pel, NP sent at 02/17/2022  4:42 PM EDT ----- Rip Harbour, you can tell her that the small bowel findings on Feb 2023 CT scan are no longer present on follow up CT in July 2023 so that is good news. Thanks .

## 2022-02-18 NOTE — Telephone Encounter (Signed)
Let pt know Paula's message. Pt verbalized understanding and had no other concerns at end of call.  

## 2022-03-02 DIAGNOSIS — R0602 Shortness of breath: Secondary | ICD-10-CM | POA: Diagnosis not present

## 2022-03-02 DIAGNOSIS — R002 Palpitations: Secondary | ICD-10-CM | POA: Diagnosis not present

## 2022-06-29 DIAGNOSIS — N803 Endometriosis of pelvic peritoneum, unspecified: Secondary | ICD-10-CM | POA: Diagnosis not present

## 2022-06-29 DIAGNOSIS — M818 Other osteoporosis without current pathological fracture: Secondary | ICD-10-CM | POA: Diagnosis not present

## 2022-08-03 DIAGNOSIS — M79632 Pain in left forearm: Secondary | ICD-10-CM | POA: Diagnosis not present

## 2022-08-03 DIAGNOSIS — M545 Low back pain, unspecified: Secondary | ICD-10-CM | POA: Diagnosis not present

## 2022-08-03 DIAGNOSIS — S39012A Strain of muscle, fascia and tendon of lower back, initial encounter: Secondary | ICD-10-CM | POA: Diagnosis not present

## 2022-08-03 DIAGNOSIS — M546 Pain in thoracic spine: Secondary | ICD-10-CM | POA: Diagnosis not present

## 2022-08-03 DIAGNOSIS — Z043 Encounter for examination and observation following other accident: Secondary | ICD-10-CM | POA: Diagnosis not present

## 2022-08-03 DIAGNOSIS — M542 Cervicalgia: Secondary | ICD-10-CM | POA: Diagnosis not present

## 2022-08-31 DIAGNOSIS — M818 Other osteoporosis without current pathological fracture: Secondary | ICD-10-CM | POA: Diagnosis not present

## 2022-08-31 DIAGNOSIS — N803 Endometriosis of pelvic peritoneum, unspecified: Secondary | ICD-10-CM | POA: Diagnosis not present

## 2023-10-01 IMAGING — CR DG ABDOMEN ACUTE W/ 1V CHEST
3 series · 3 of 3 positions shown · non-contrast
Comparison: CT abdomen and pelvis 11/27/2020. Abdominal radiograph
07/28/2019. Chest radiographs 02/26/2009.

CLINICAL DATA: Abdominal pain radiating to the back for 2 days.

EXAM:
DG ABDOMEN ACUTE WITH 1 VIEW CHEST

[w chest pa]
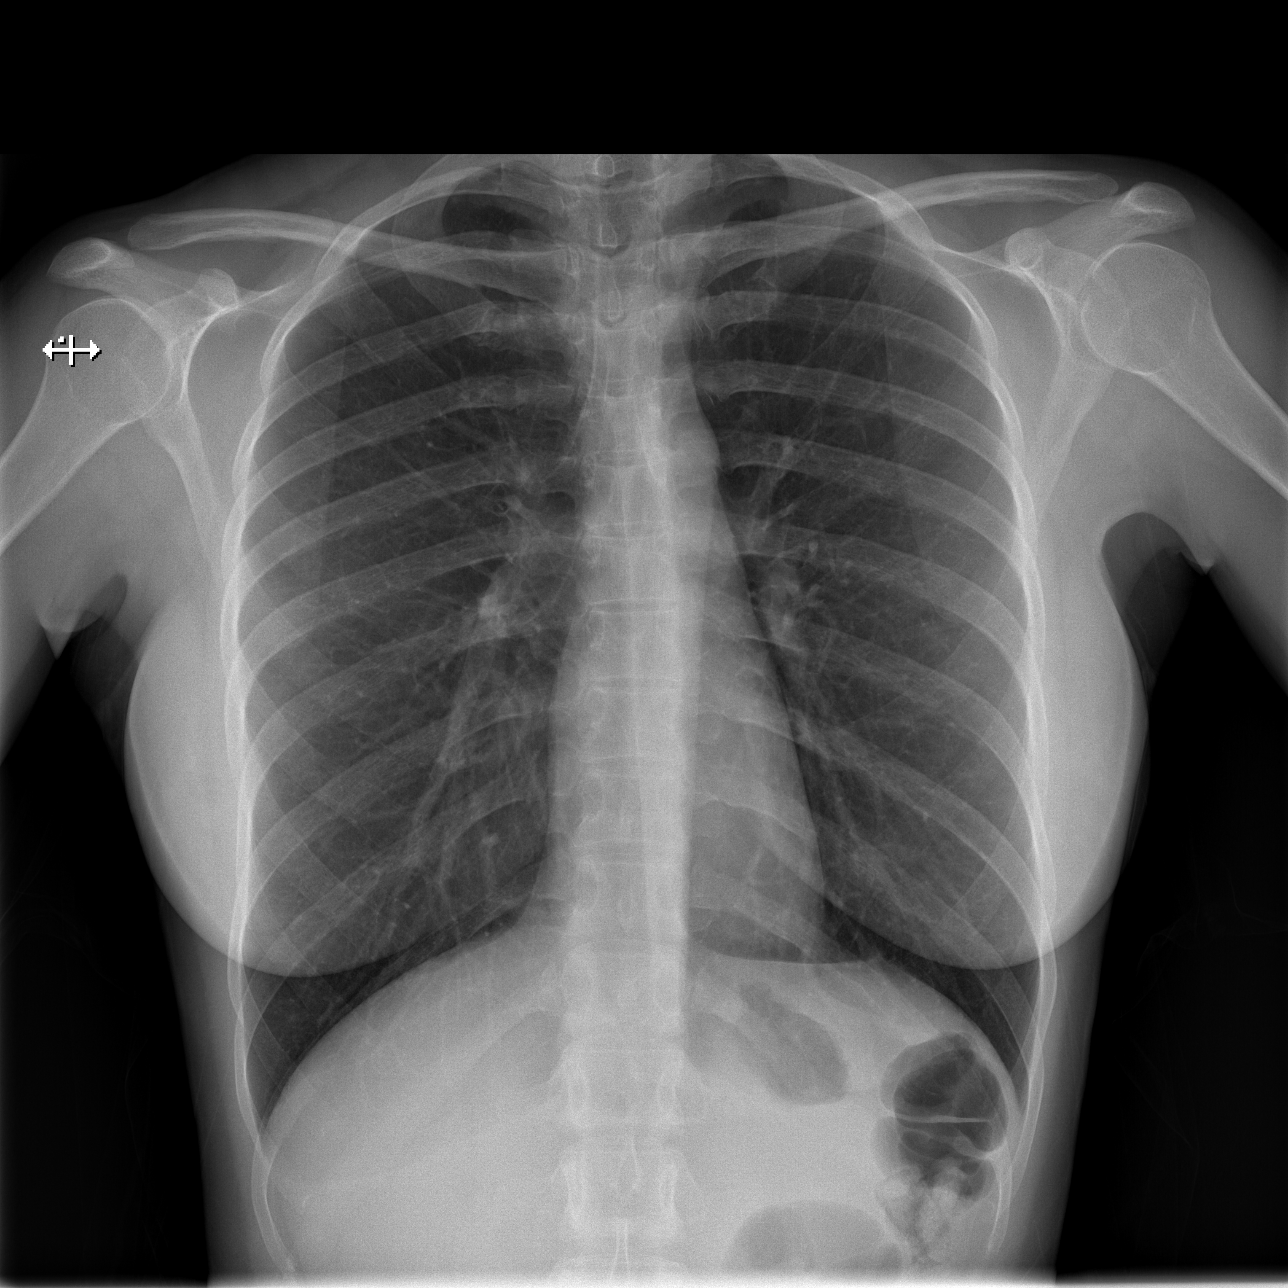

[w abdomen upright]
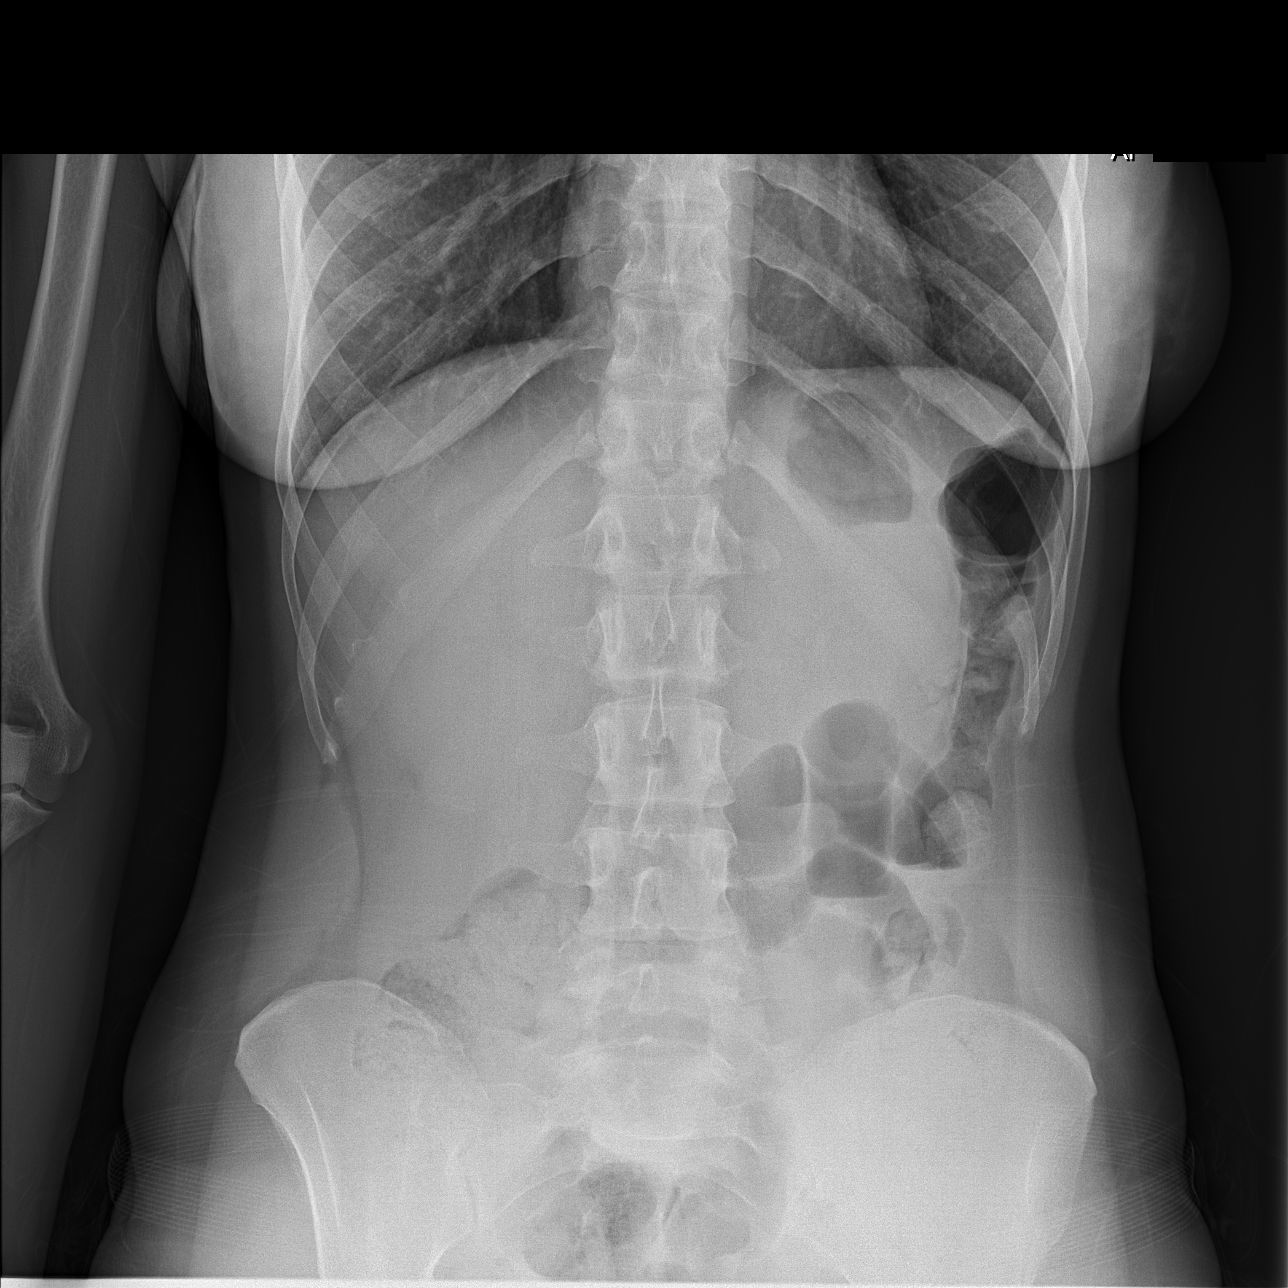

[t abdomen supine]
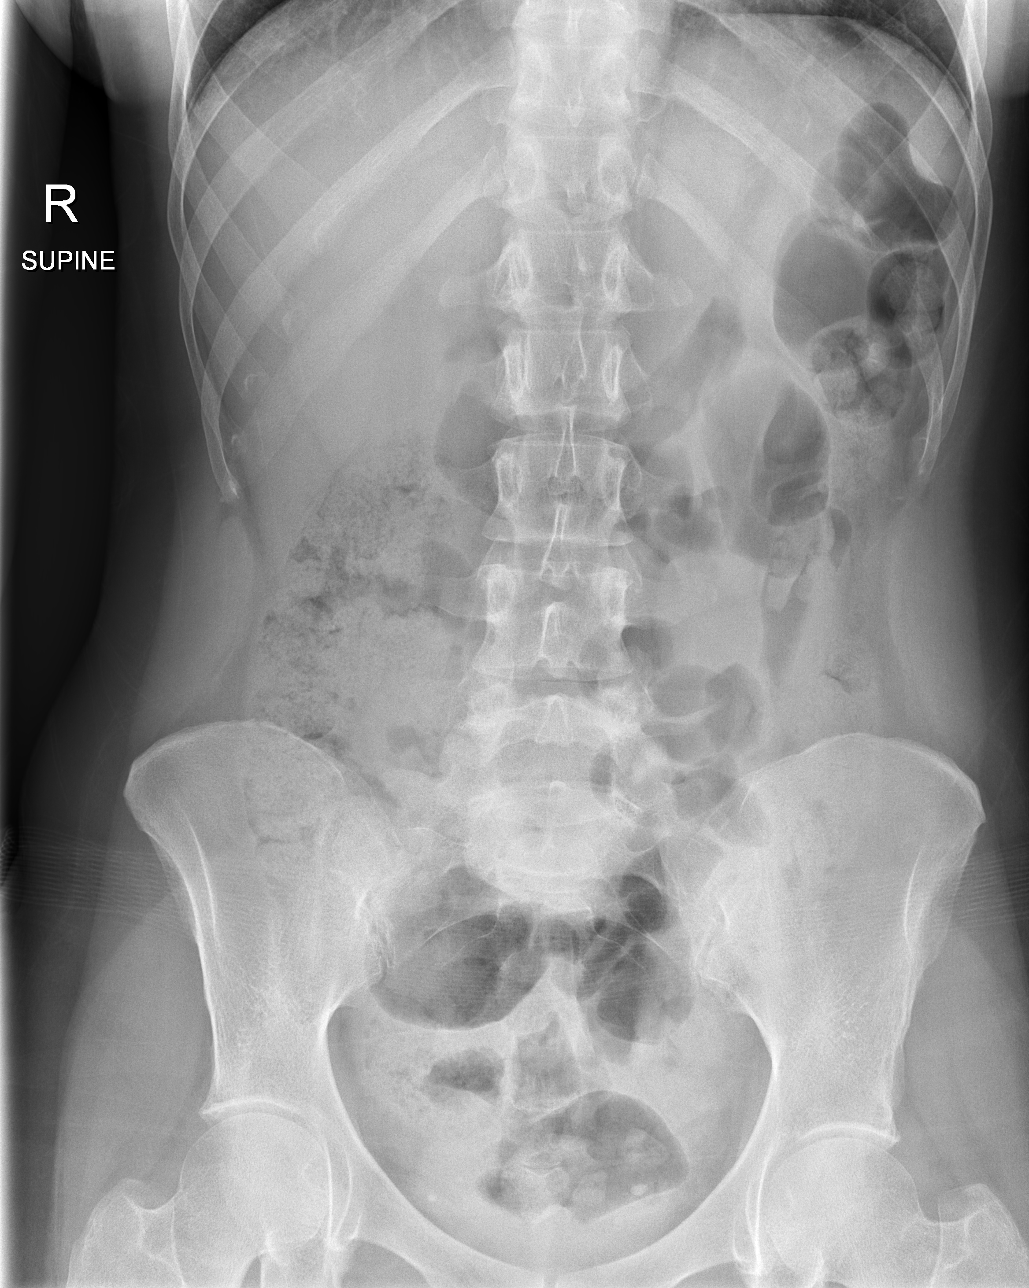

[3 of 3 positions shown; findings below may reference images not displayed]

FINDINGS: The cardiomediastinal silhouette is unchanged with normal heart
size. The lungs are well inflated and clear. No pleural effusion or
pneumothorax is identified.

No intraperitoneal free air is identified. A moderate amount of
stool is noted in the colon. No dilated loops of bowel are seen to
suggest obstruction. No acute osseous abnormality is seen.
IMPRESSION: No evidence of bowel obstruction or acute cardiopulmonary disease.

## 2024-02-15 ENCOUNTER — Other Ambulatory Visit (HOSPITAL_COMMUNITY): Payer: Self-pay

## 2024-05-25 ENCOUNTER — Other Ambulatory Visit (HOSPITAL_COMMUNITY): Payer: Self-pay
